# Patient Record
Sex: Female | Born: 1945 | Race: White | Hispanic: No | Marital: Married | Smoking: Never smoker
Health system: Southern US, Community
[De-identification: ages and names within clinical notes are randomized; demographics above are authoritative.]

## PROBLEM LIST (undated history)

## (undated) DIAGNOSIS — I639 Cerebral infarction, unspecified: Secondary | ICD-10-CM

## (undated) DIAGNOSIS — M8580 Other specified disorders of bone density and structure, unspecified site: Secondary | ICD-10-CM

## (undated) DIAGNOSIS — G459 Transient cerebral ischemic attack, unspecified: Secondary | ICD-10-CM

## (undated) DIAGNOSIS — K209 Esophagitis, unspecified without bleeding: Secondary | ICD-10-CM

## (undated) DIAGNOSIS — H269 Unspecified cataract: Secondary | ICD-10-CM

## (undated) DIAGNOSIS — J309 Allergic rhinitis, unspecified: Secondary | ICD-10-CM

## (undated) DIAGNOSIS — E7800 Pure hypercholesterolemia, unspecified: Secondary | ICD-10-CM

## (undated) DIAGNOSIS — M1990 Unspecified osteoarthritis, unspecified site: Secondary | ICD-10-CM

## (undated) DIAGNOSIS — R7989 Other specified abnormal findings of blood chemistry: Secondary | ICD-10-CM

## (undated) DIAGNOSIS — K219 Gastro-esophageal reflux disease without esophagitis: Secondary | ICD-10-CM

## (undated) DIAGNOSIS — Z8619 Personal history of other infectious and parasitic diseases: Secondary | ICD-10-CM

## (undated) HISTORY — DX: Other specified abnormal findings of blood chemistry: R79.89

## (undated) HISTORY — DX: Cerebral infarction, unspecified: I63.9

## (undated) HISTORY — DX: Unspecified cataract: H26.9

## (undated) HISTORY — PX: CATARACT EXTRACTION: SUR2

## (undated) HISTORY — DX: Unspecified osteoarthritis, unspecified site: M19.90

## (undated) HISTORY — DX: Transient cerebral ischemic attack, unspecified: G45.9

## (undated) HISTORY — DX: Personal history of other infectious and parasitic diseases: Z86.19

## (undated) HISTORY — DX: Esophagitis, unspecified: K20.9

## (undated) HISTORY — DX: Other specified disorders of bone density and structure, unspecified site: M85.80

## (undated) HISTORY — PX: HEMORRHOID SURGERY: SHX153

## (undated) HISTORY — DX: Esophagitis, unspecified without bleeding: K20.90

## (undated) HISTORY — DX: Allergic rhinitis, unspecified: J30.9

## (undated) HISTORY — DX: Pure hypercholesterolemia, unspecified: E78.00

## (undated) HISTORY — DX: Gastro-esophageal reflux disease without esophagitis: K21.9

## (undated) HISTORY — PX: INGUINAL HERNIA REPAIR: SUR1180

---

## 1968-10-23 HISTORY — PX: BREAST EXCISIONAL BIOPSY: SUR124

## 1998-10-23 HISTORY — PX: BREAST BIOPSY: SHX20

## 1999-10-24 DIAGNOSIS — G459 Transient cerebral ischemic attack, unspecified: Secondary | ICD-10-CM

## 1999-10-24 HISTORY — DX: Transient cerebral ischemic attack, unspecified: G45.9

## 2003-10-24 HISTORY — PX: COLONOSCOPY: SHX174

## 2004-09-20 ENCOUNTER — Ambulatory Visit: Payer: Self-pay | Admitting: Internal Medicine

## 2005-02-16 ENCOUNTER — Ambulatory Visit: Payer: Self-pay | Admitting: Internal Medicine

## 2005-09-28 ENCOUNTER — Ambulatory Visit: Payer: Self-pay | Admitting: Internal Medicine

## 2006-12-04 ENCOUNTER — Ambulatory Visit: Payer: Self-pay | Admitting: Internal Medicine

## 2006-12-13 ENCOUNTER — Ambulatory Visit: Payer: Self-pay | Admitting: Internal Medicine

## 2006-12-18 ENCOUNTER — Ambulatory Visit: Payer: Self-pay | Admitting: Internal Medicine

## 2007-01-08 ENCOUNTER — Ambulatory Visit: Payer: Self-pay | Admitting: Oncology

## 2007-01-22 ENCOUNTER — Ambulatory Visit: Payer: Self-pay | Admitting: Oncology

## 2007-04-09 ENCOUNTER — Ambulatory Visit: Payer: Self-pay | Admitting: Oncology

## 2007-04-23 ENCOUNTER — Ambulatory Visit: Payer: Self-pay | Admitting: Oncology

## 2007-07-24 ENCOUNTER — Ambulatory Visit: Payer: Self-pay | Admitting: Oncology

## 2007-11-11 IMAGING — CT CT ABDOMEN WO/W CM
1 of 4 series · 11 of 32 positions shown, 17 images · non-contrast
Comparison: none

REASON FOR EXAM: f/u on US  fullness on Pancreatic head  bilateral
solidness renal masses
COMMENTS:

[Series 3: with · axial · 0.62mm/px · z∈[-564,-188]mm · 11 of 57 slices shown, 17 images]
[im 5/57  soft-tissue]
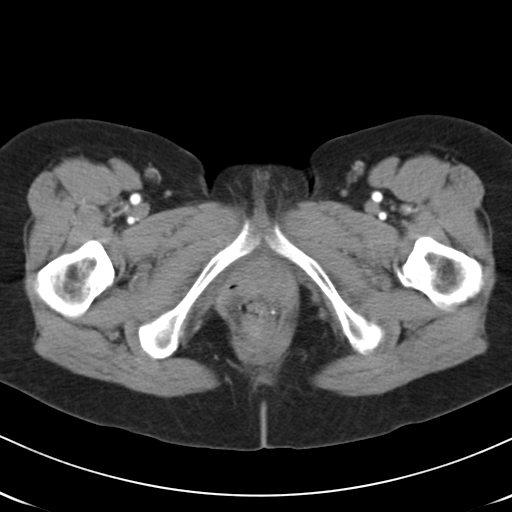
[im 5/57  bone]
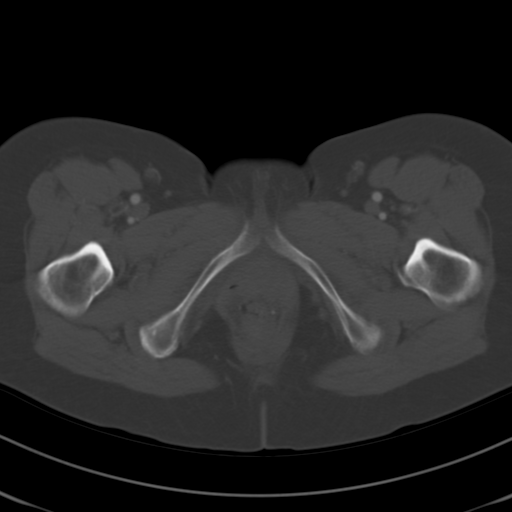
[im 10/57  soft-tissue]
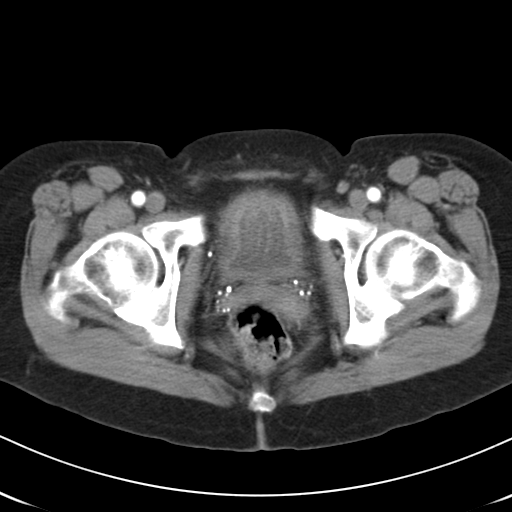
[im 15/57  soft-tissue]
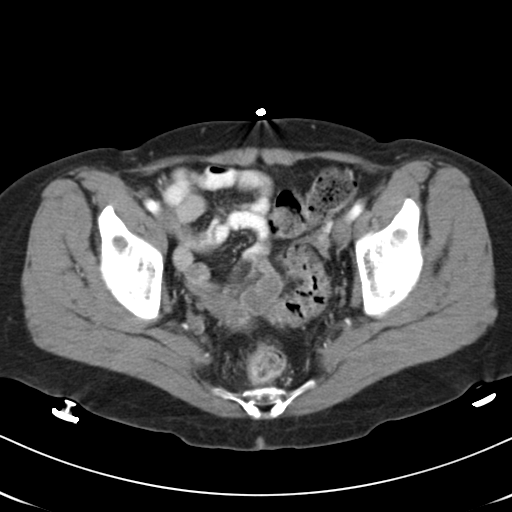
[im 19/57  soft-tissue]
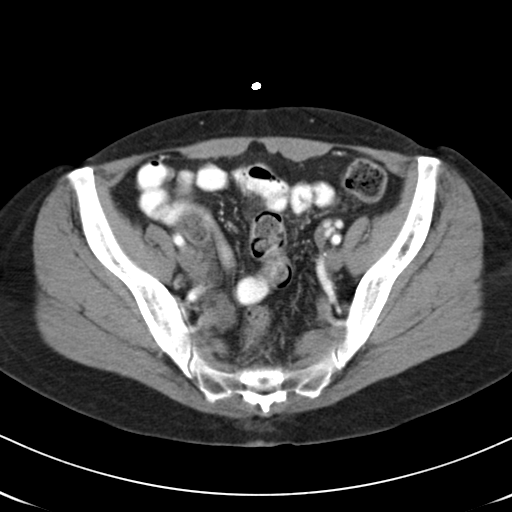
[im 24/57  soft-tissue]
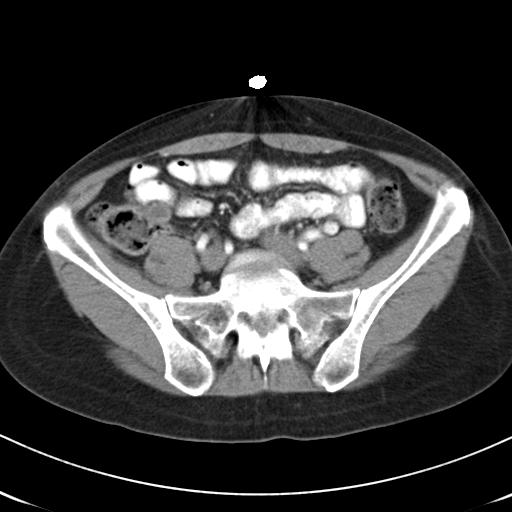
[im 29/57  soft-tissue]
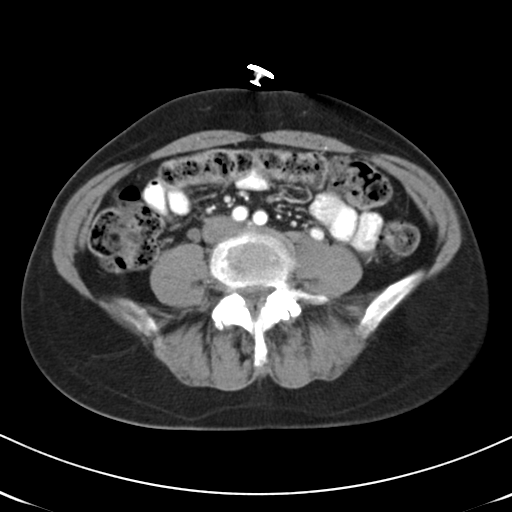
[im 33/57  soft-tissue]
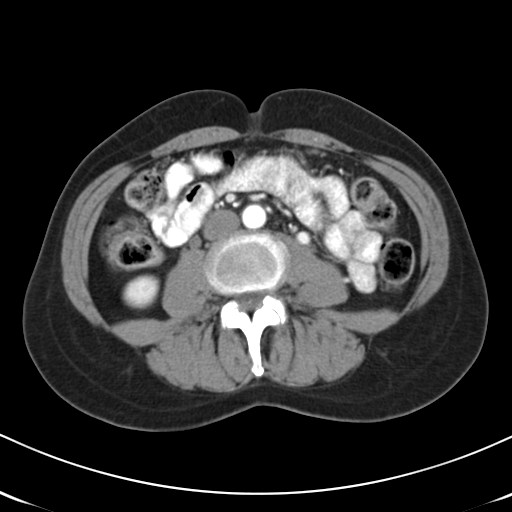
[im 38/57  soft-tissue]
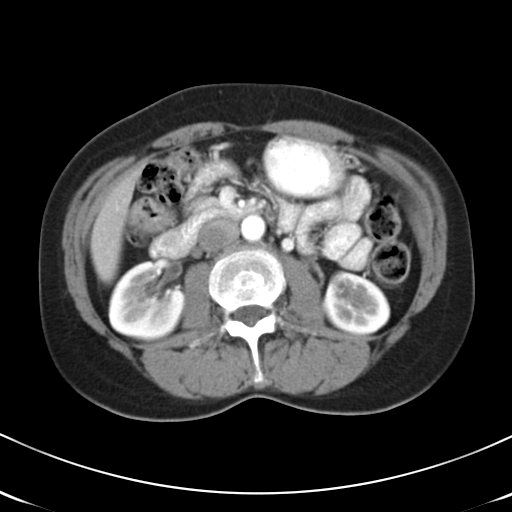
[im 38/57  lung]
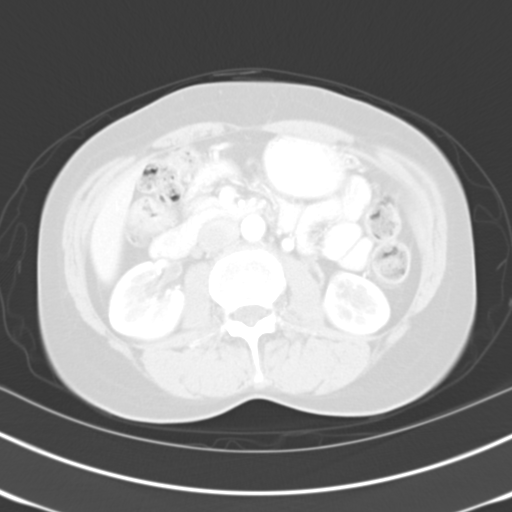
[im 43/57  soft-tissue]
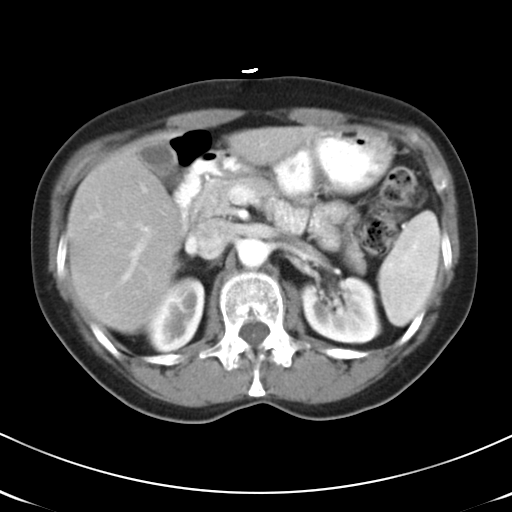
[im 43/57  lung]
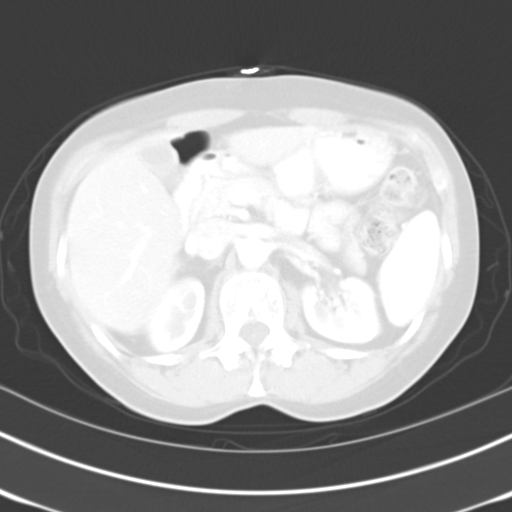
[im 43/57  bone]
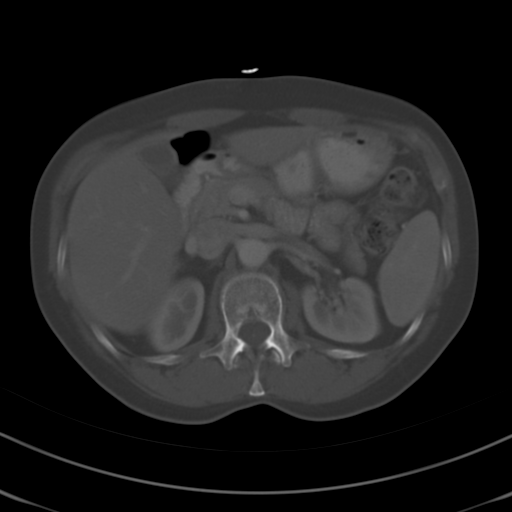
[im 47/57  soft-tissue]
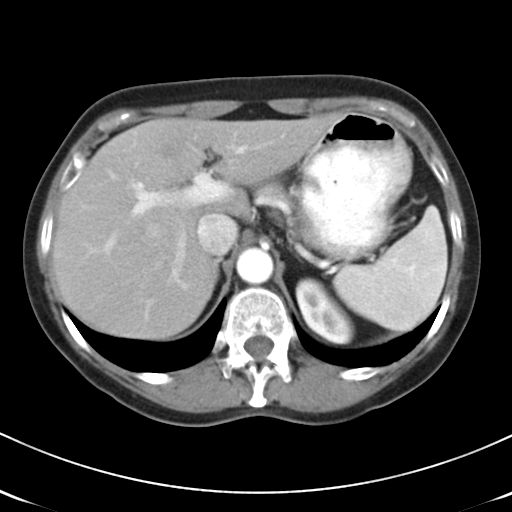
[im 47/57  lung]
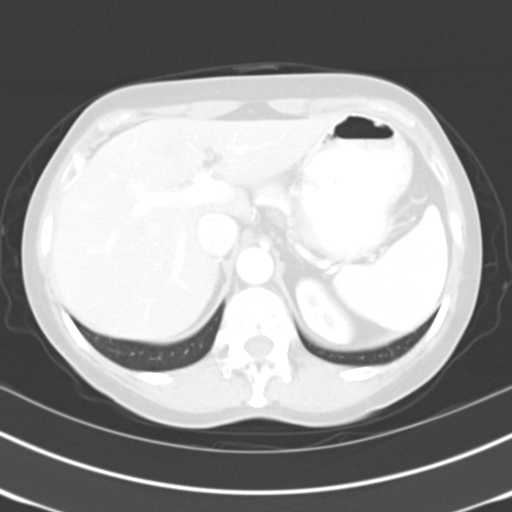
[im 52/57  soft-tissue]
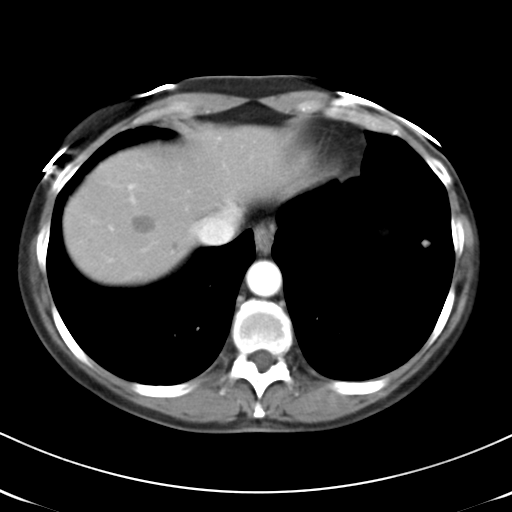
[im 52/57  lung]
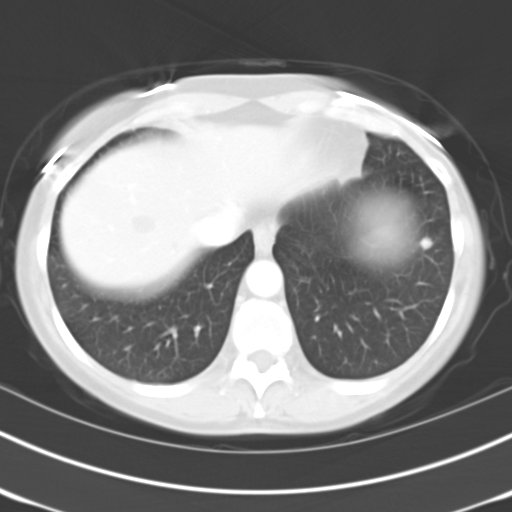

[11 of 32 positions shown; findings below may reference images not displayed]

PROCEDURE:     CT  - CT ABDOMEN STANDARD W/WO  - December 13, 2006  [DATE]

RESULT:     Triphasic CT scan of the abdomen is performed. There are
multiple well circumscribed low attenuation areas within the liver
suggestive of hepatic cysts. The showing no evidence of enhancement, nodule
or wall thickening. The pancreas has been normal. There is no pancreatic
ductal dilatation or hepatic ductal dilatation. The spleen is unremarkable.
The adrenal glands and kidneys are normal. The abdominal aorta is
unremarkable. And kidneys show some tiny areas of low density which too
small fracture characterization on image 16. A larger area of low density
seen along the anterior/inferior aspect of the knee left kidney measuring
approximately 16 mm in size. This has Hounsfield reading of -1 and may
contain some fat. An angiomyolipoma may be present in this region. Followup
in 6 months 2 year would be recommended the lung bases are clear.
IMPRESSION: 1. Simple hepatic cysts.
2. No evidence of pancreatic mass.
3. Probable left renal angiomyolipoma. Followed CT in [AGE] would be
recommended to ensure stability.

## 2008-02-25 ENCOUNTER — Ambulatory Visit: Payer: Self-pay | Admitting: Internal Medicine

## 2009-02-26 ENCOUNTER — Ambulatory Visit: Payer: Self-pay | Admitting: Internal Medicine

## 2010-08-15 ENCOUNTER — Ambulatory Visit: Payer: Self-pay | Admitting: Internal Medicine

## 2010-10-23 DIAGNOSIS — Z8619 Personal history of other infectious and parasitic diseases: Secondary | ICD-10-CM

## 2010-10-23 HISTORY — PX: ESOPHAGOGASTRODUODENOSCOPY: SHX1529

## 2010-10-23 HISTORY — DX: Personal history of other infectious and parasitic diseases: Z86.19

## 2011-05-18 ENCOUNTER — Ambulatory Visit: Payer: Self-pay | Admitting: Internal Medicine

## 2011-07-18 ENCOUNTER — Ambulatory Visit: Payer: Self-pay | Admitting: Gastroenterology

## 2011-07-21 LAB — PATHOLOGY REPORT

## 2011-09-12 ENCOUNTER — Ambulatory Visit: Payer: Self-pay | Admitting: Internal Medicine

## 2012-09-04 ENCOUNTER — Encounter: Payer: Self-pay | Admitting: Internal Medicine

## 2012-09-04 ENCOUNTER — Ambulatory Visit (INDEPENDENT_AMBULATORY_CARE_PROVIDER_SITE_OTHER): Payer: BC Managed Care – PPO | Admitting: Internal Medicine

## 2012-09-04 VITALS — BP 112/72 | HR 67 | Temp 98.4°F | Ht 62.5 in | Wt 128.5 lb

## 2012-09-04 DIAGNOSIS — D3000 Benign neoplasm of unspecified kidney: Secondary | ICD-10-CM

## 2012-09-04 DIAGNOSIS — K219 Gastro-esophageal reflux disease without esophagitis: Secondary | ICD-10-CM

## 2012-09-04 DIAGNOSIS — M899 Disorder of bone, unspecified: Secondary | ICD-10-CM

## 2012-09-04 DIAGNOSIS — Z139 Encounter for screening, unspecified: Secondary | ICD-10-CM

## 2012-09-04 DIAGNOSIS — D179 Benign lipomatous neoplasm, unspecified: Secondary | ICD-10-CM

## 2012-09-04 DIAGNOSIS — M8580 Other specified disorders of bone density and structure, unspecified site: Secondary | ICD-10-CM

## 2012-09-04 DIAGNOSIS — M949 Disorder of cartilage, unspecified: Secondary | ICD-10-CM

## 2012-09-04 DIAGNOSIS — I10 Essential (primary) hypertension: Secondary | ICD-10-CM

## 2012-09-04 DIAGNOSIS — E7800 Pure hypercholesterolemia, unspecified: Secondary | ICD-10-CM

## 2012-09-04 DIAGNOSIS — G459 Transient cerebral ischemic attack, unspecified: Secondary | ICD-10-CM

## 2012-09-04 NOTE — Patient Instructions (Addendum)
It was nice seeing you today.  I am glad you have been doing well.  Let me know if you need anything. 

## 2012-09-06 ENCOUNTER — Ambulatory Visit: Payer: Self-pay | Admitting: Internal Medicine

## 2012-09-08 ENCOUNTER — Encounter: Payer: Self-pay | Admitting: Internal Medicine

## 2012-09-08 DIAGNOSIS — Z8673 Personal history of transient ischemic attack (TIA), and cerebral infarction without residual deficits: Secondary | ICD-10-CM | POA: Insufficient documentation

## 2012-09-08 DIAGNOSIS — M8580 Other specified disorders of bone density and structure, unspecified site: Secondary | ICD-10-CM | POA: Insufficient documentation

## 2012-09-08 DIAGNOSIS — D179 Benign lipomatous neoplasm, unspecified: Secondary | ICD-10-CM | POA: Insufficient documentation

## 2012-09-08 DIAGNOSIS — E7800 Pure hypercholesterolemia, unspecified: Secondary | ICD-10-CM | POA: Insufficient documentation

## 2012-09-08 DIAGNOSIS — K219 Gastro-esophageal reflux disease without esophagitis: Secondary | ICD-10-CM | POA: Insufficient documentation

## 2012-09-08 NOTE — Assessment & Plan Note (Signed)
Last vitamin D level checked 08/09/11 - wnl.  She continues calcium, vitamin D and weight bearing exercises.  Bone density 01/29/08 revealed osteopenia with some slight decrease.  Will notify me when agreeable for a follow up bone density (per previous conversation).

## 2012-09-08 NOTE — Assessment & Plan Note (Signed)
Worked up by Dr Choksi.  Felt no further w/up warranted.    

## 2012-09-08 NOTE — Assessment & Plan Note (Signed)
The feeling in her throat could be reflux.  On dexilant.  EGD 07/18/11 revealed reflux esophagitis.  Treated for H. Pylori.  Wants to see Dr Jenne Campus.  Follow.

## 2012-09-08 NOTE — Assessment & Plan Note (Signed)
On simvastatin.  Low cholesterol diet and exercise.  Check lipid panel and liver function.  

## 2012-09-08 NOTE — Assessment & Plan Note (Signed)
Maintained on Plavix and doing well.  Follow.    

## 2012-09-08 NOTE — Progress Notes (Signed)
  Subjective:    Patient ID: Joan Ritter, female    DOB: 09-28-1946, 66 y.o.   MRN: [id number]  HPI 66 year old female with past history of hypercholesterolemia and TIA who comes in today for a scheduled follow up.  States she is doing well.  Feels good.  Staying active.  Saw Dr Cheree Ditto recently.  Had a precancerous skin lesion removed from her abdomen.  Clear margins.  Recommended follow up in one year.  She is having some drainage in her throat.  Is planning to see Dr Jenne Campus for this.  No dysphagia or odynophagia.  We discussed the possibility of acid reflux.  Taking dexilant.    Past Medical History  Diagnosis Date  . TIA (transient ischemic attack) 2001  . Hypercholesterolemia   . GERD (gastroesophageal reflux disease)   . Allergic rhinitis     seasonal  . Positive H. pylori test     history of    Review of Systems Patient denies any headache, lightheadedness or dizziness.  Drainage as outlined.  Feels like something in her throat.  No chest pain, tightness or palpitations.  No increased shortness of breath, cough or congestion.  No nausea or vomiting.  No abdominal pain or cramping.  No bowel change, such as diarrhea, constipation, BRBPR or melana.  No urine change.        Objective:   Physical Exam Filed Vitals:   09/04/12 1513  BP: 112/72  Pulse: 67  Temp: 98.4 F (49.65 C)   66 year old female in no acute distress.   HEENT:  Nares - clear.  OP- without lesions or erythema.  NECK:  Supple, nontender.   HEART:  Appears to be regular. LUNGS:  Without crackles or wheezing audible.  Respirations even and unlabored.   RADIAL PULSE:  Equal bilaterally.  ABDOMEN:  Soft, nontender.  No audible abdominal bruit.   EXTREMITIES:  No increased edema to be present.                     Assessment & Plan:  CONGESTION.  Discussed flushing with saline nasal spray.  Use Flonase.  She is going to see Dr Jenne Campus for evaluation.  Continue Dexilant for reflux.  Follow.    DERMATOLOGY.   Recent skin cancer removed.  (precancerous lesion).  Continue to follow up with Dr Cheree Ditto.    LEFT CAROTID BRUIT.  Carotid ultrasound 05/18/11 revealed a small amount of plaque, but no hemodynamically significant stenosis.  Continue Plavix.  Currently asymptomatic.    HEALTH MAINTENANCE.  Physical 08/16/11.  Wanted to wait until next visit for her physical.  Mammogram 09/12/11 - BiRADS II.  Schedule a follow up mammogram.  Colonoscopy 03/26/04 normal.

## 2012-09-10 ENCOUNTER — Other Ambulatory Visit (INDEPENDENT_AMBULATORY_CARE_PROVIDER_SITE_OTHER): Payer: BC Managed Care – PPO

## 2012-09-10 DIAGNOSIS — E7800 Pure hypercholesterolemia, unspecified: Secondary | ICD-10-CM

## 2012-09-10 DIAGNOSIS — I10 Essential (primary) hypertension: Secondary | ICD-10-CM

## 2012-09-10 LAB — LIPID PANEL
LDL Cholesterol: 108 mg/dL — ABNORMAL HIGH (ref 0–99)
VLDL: 16.8 mg/dL (ref 0.0–40.0)

## 2012-09-10 LAB — HEPATIC FUNCTION PANEL
Bilirubin, Direct: 0.1 mg/dL (ref 0.0–0.3)
Total Bilirubin: 0.7 mg/dL (ref 0.3–1.2)

## 2012-09-10 LAB — BASIC METABOLIC PANEL
BUN: 16 mg/dL (ref 6–23)
GFR: 68.17 mL/min (ref >60.00)
Glucose, Bld: 104 mg/dL — ABNORMAL HIGH (ref 70–99)
Potassium: 4.3 mEq/L (ref 3.5–5.1)

## 2012-09-11 ENCOUNTER — Other Ambulatory Visit: Payer: Self-pay | Admitting: *Deleted

## 2012-09-26 ENCOUNTER — Telehealth: Payer: Self-pay | Admitting: Internal Medicine

## 2012-09-26 NOTE — Telephone Encounter (Signed)
Refill request for simvastatin 10 mg tablet  Qty: 90 Sig: take 1 tablet by mouth once a day

## 2012-09-27 ENCOUNTER — Telehealth: Payer: Self-pay | Admitting: Internal Medicine

## 2012-09-27 MED ORDER — SIMVASTATIN 10 MG PO TABS: 10.0000 mg | ORAL_TABLET | Freq: Every day | ORAL | Status: DC

## 2012-09-27 NOTE — Telephone Encounter (Signed)
Refill on Simvastin 10 mg and Plavix 75 mg

## 2012-09-27 NOTE — Telephone Encounter (Signed)
Sent in to pharmacy.  

## 2012-09-27 NOTE — Telephone Encounter (Signed)
error 

## 2012-09-27 NOTE — Telephone Encounter (Signed)
Pt calling again asking for refill. She only has 1 pill left.

## 2012-09-30 ENCOUNTER — Telehealth: Payer: Self-pay | Admitting: Internal Medicine

## 2012-09-30 NOTE — Telephone Encounter (Signed)
Ok to refill x 6.  Need to call in today if out

## 2012-09-30 NOTE — Telephone Encounter (Signed)
Pt is needing refill on Plavix (generic). Her pharmacy is saying they never received it and she is completely out.

## 2012-10-01 MED ORDER — CLOPIDOGREL BISULFATE 75 MG PO TABS: 75.0000 mg | ORAL_TABLET | Freq: Every day | ORAL | Status: DC

## 2012-10-01 NOTE — Telephone Encounter (Signed)
Pharmacy called they have not received the order for her generic Plavix. The patient is calling.

## 2012-10-01 NOTE — Telephone Encounter (Signed)
Called prescription in to pharmacy. Called patient to let her know.

## 2012-10-09 ENCOUNTER — Ambulatory Visit: Payer: Self-pay | Admitting: Internal Medicine

## 2012-10-28 ENCOUNTER — Encounter: Payer: Self-pay | Admitting: Internal Medicine

## 2012-11-01 ENCOUNTER — Other Ambulatory Visit: Payer: Self-pay | Admitting: Internal Medicine

## 2012-11-01 NOTE — Telephone Encounter (Signed)
Sent in to pharmacy.  

## 2013-01-30 NOTE — Telephone Encounter (Signed)
Please close encounter

## 2013-02-25 ENCOUNTER — Other Ambulatory Visit: Payer: BC Managed Care – PPO

## 2013-02-25 ENCOUNTER — Telehealth: Payer: Self-pay | Admitting: *Deleted

## 2013-02-25 DIAGNOSIS — G459 Transient cerebral ischemic attack, unspecified: Secondary | ICD-10-CM

## 2013-02-25 DIAGNOSIS — M8580 Other specified disorders of bone density and structure, unspecified site: Secondary | ICD-10-CM

## 2013-02-25 DIAGNOSIS — E7800 Pure hypercholesterolemia, unspecified: Secondary | ICD-10-CM

## 2013-02-25 NOTE — Telephone Encounter (Signed)
Pt is coming in for labs tomorrow 05.07.2014 what labs and dx would you like?  Thank you 

## 2013-02-25 NOTE — Telephone Encounter (Signed)
Order placed for labs.

## 2013-02-26 ENCOUNTER — Other Ambulatory Visit (INDEPENDENT_AMBULATORY_CARE_PROVIDER_SITE_OTHER): Payer: Medicare Other

## 2013-02-26 DIAGNOSIS — E7800 Pure hypercholesterolemia, unspecified: Secondary | ICD-10-CM

## 2013-02-26 DIAGNOSIS — G459 Transient cerebral ischemic attack, unspecified: Secondary | ICD-10-CM

## 2013-02-26 LAB — CBC WITH DIFFERENTIAL/PLATELET
Basophils Absolute: 0 10*3/uL (ref 0.0–0.1)
Eosinophils Absolute: 0.1 10*3/uL (ref 0.0–0.7)
Hemoglobin: 14.1 g/dL (ref 12.0–15.0)
Lymphocytes Relative: 30.4 % (ref 12.0–46.0)
MCHC: 34.2 g/dL (ref 30.0–36.0)
Monocytes Relative: 8.8 % (ref 3.0–12.0)
Neutro Abs: 3.1 10*3/uL (ref 1.4–7.7)
Platelets: 179 10*3/uL (ref 150.0–400.0)
RDW: 13 % (ref 11.5–14.6)

## 2013-02-26 LAB — HEPATIC FUNCTION PANEL
ALT: 21 U/L (ref 0–35)
AST: 23 U/L (ref 0–37)
Albumin: 3.7 g/dL (ref 3.5–5.2)
Alkaline Phosphatase: 53 U/L (ref 39–117)
Bilirubin, Direct: 0.1 mg/dL (ref 0.0–0.3)
Total Protein: 6.3 g/dL (ref 6.0–8.3)

## 2013-02-26 LAB — BASIC METABOLIC PANEL
BUN: 14 mg/dL (ref 6–23)
Calcium: 8.9 mg/dL (ref 8.4–10.5)
Chloride: 107 mEq/L (ref 96–112)
Creatinine, Ser: 0.9 mg/dL (ref 0.4–1.2)
GFR: 68.97 mL/min (ref >60.00)

## 2013-02-26 LAB — LIPID PANEL
Cholesterol: 169 mg/dL (ref 0–200)
LDL Cholesterol: 95 mg/dL (ref 0–99)
Triglycerides: 62 mg/dL (ref 0.0–149.0)

## 2013-03-04 ENCOUNTER — Other Ambulatory Visit (HOSPITAL_COMMUNITY)
Admission: RE | Admit: 2013-03-04 | Discharge: 2013-03-04 | Disposition: A | Discharge status: Home / Self Care | Payer: Medicare Other | Source: Ambulatory Visit | Attending: Internal Medicine | Admitting: Internal Medicine

## 2013-03-04 ENCOUNTER — Encounter: Payer: Self-pay | Admitting: Internal Medicine

## 2013-03-04 ENCOUNTER — Ambulatory Visit (INDEPENDENT_AMBULATORY_CARE_PROVIDER_SITE_OTHER): Payer: Medicare Other | Admitting: Internal Medicine

## 2013-03-04 VITALS — BP 110/70 | HR 65 | Temp 98.0°F | Ht 62.25 in | Wt 129.5 lb

## 2013-03-04 DIAGNOSIS — Z1151 Encounter for screening for human papillomavirus (HPV): Secondary | ICD-10-CM | POA: Insufficient documentation

## 2013-03-04 DIAGNOSIS — Z1211 Encounter for screening for malignant neoplasm of colon: Secondary | ICD-10-CM

## 2013-03-04 DIAGNOSIS — Z124 Encounter for screening for malignant neoplasm of cervix: Secondary | ICD-10-CM

## 2013-03-04 DIAGNOSIS — D3000 Benign neoplasm of unspecified kidney: Secondary | ICD-10-CM

## 2013-03-04 DIAGNOSIS — Z01419 Encounter for gynecological examination (general) (routine) without abnormal findings: Secondary | ICD-10-CM | POA: Insufficient documentation

## 2013-03-04 DIAGNOSIS — G459 Transient cerebral ischemic attack, unspecified: Secondary | ICD-10-CM

## 2013-03-04 DIAGNOSIS — E7800 Pure hypercholesterolemia, unspecified: Secondary | ICD-10-CM

## 2013-03-04 DIAGNOSIS — K219 Gastro-esophageal reflux disease without esophagitis: Secondary | ICD-10-CM

## 2013-03-04 DIAGNOSIS — D179 Benign lipomatous neoplasm, unspecified: Secondary | ICD-10-CM

## 2013-03-04 DIAGNOSIS — M8580 Other specified disorders of bone density and structure, unspecified site: Secondary | ICD-10-CM

## 2013-03-04 DIAGNOSIS — M949 Disorder of cartilage, unspecified: Secondary | ICD-10-CM

## 2013-03-04 NOTE — Progress Notes (Signed)
Subjective:    Patient ID: Joan Ritter, female    DOB: 09/05/1946, 67 y.o.   MRN: [id number]  HPI 67 year old female with past history of hypercholesterolemia and TIA who comes in today to follow up on these issues as well as for a complete physical exam.  States she is doing well.  Feels good.  Staying active.  Saw Dr Cheree Ditto.  Had a precancerous skin lesion removed from her abdomen.  Clear margins.  Recommended follow up in one year.  Saw Dr Jenne Campus.  He stopped her Dexilant and placed her on omeprazole and ranitidine.  Still with some increased sensation in her throat.  No dysphagia or odynophagia.  No nausea or vomiting.  Bowels stable.    Past Medical History  Diagnosis Date  . TIA (transient ischemic attack) 2001  . Hypercholesterolemia   . GERD (gastroesophageal reflux disease)   . Allergic rhinitis     seasonal  . Positive H. pylori test     history of    Current Outpatient Prescriptions on File Prior to Visit  Medication Sig Dispense Refill  . aspirin 81 MG tablet Take 81 mg by mouth daily.      . Calcium Carbonate-Vit D-Min 600-400 MG-UNIT TABS Take 1 tablet by mouth 2 (two) times daily.      . cetirizine (ZYRTEC) 10 MG tablet Take 10 mg by mouth daily.      . clopidogrel (PLAVIX) 75 MG tablet Take 1 tablet (75 mg total) by mouth daily.  30 tablet  6  . fluticasone (FLONASE) 50 MCG/ACT nasal spray USE 2 SPRAYS EACH NOSTRIL EVERY DAY  16 g  5  . glucosamine-chondroitin 500-400 MG tablet Take 1 tablet by mouth daily.      . Multiple Vitamin (MULTIVITAMIN) tablet Take 1 tablet by mouth daily.      . simvastatin (ZOCOR) 10 MG tablet Take 1 tablet (10 mg total) by mouth at bedtime.  90 tablet  1  . acyclovir (ZOVIRAX) 800 MG tablet Take 800 mg by mouth as needed.      Marland Kitchen dexlansoprazole (DEXILANT) 60 MG capsule Take 60 mg by mouth daily.       No current facility-administered medications on file prior to visit.    Review of Systems Patient denies any headache, lightheadedness  or dizziness.  Feels like something in her throat.  On omeprazole and ranitidine.  Seeing Dr Jenne Campus.  No chest pain, tightness or palpitations.  No increased shortness of breath, cough or congestion.  No nausea or vomiting.  No abdominal pain or cramping.  No bowel change, such as diarrhea, constipation, BRBPR or melana.  No urine change.        Objective:   Physical Exam  Filed Vitals:   03/04/13 0824  BP: 110/70  Pulse: 65  Temp: 98 F (67.34 C)   67 year old female in no acute distress.   HEENT:  Nares- clear.  Oropharynx - without lesions. NECK:  Supple.  Nontender.  No audible bruit.  HEART:  Appears to be regular. LUNGS:  No crackles or wheezing audible.  Respirations even and unlabored.  RADIAL PULSE:  Equal bilaterally.    BREASTS:  No nipple discharge or nipple retraction present.  Could not appreciate any distinct nodules or axillary adenopathy.  ABDOMEN:  Soft, nontender.  Bowel sounds present and normal.  No audible abdominal bruit.  GU:  Normal external genitalia.  Vaginal vault without lesions.  Cervix identified.  Pap performed. Could not appreciate  any adnexal masses or tenderness.   RECTAL:  Heme negative.   EXTREMITIES:  No increased edema present.  DP pulses palpable and equal bilaterally.            Assessment & Plan:  THROAT FULLNESS.   Continue with saline nasal spray.  Use Flonase.  Seeing Dr Jenne Campus.  On omeprazole and ranitidine.    DERMATOLOGY.  Recent skin cancer removed.  (precancerous lesion).  Continue to follow up with Dr Cheree Ditto.    LEFT CAROTID BRUIT.  Carotid ultrasound 05/18/11 revealed a small amount of plaque, but no hemodynamically significant stenosis.  Continue Plavix.  Currently asymptomatic.  Recheck carotid ultrasound.    HEALTH MAINTENANCE.  Physical toay.   Mammogram 10/09/12  - BiRADS II.  Colonoscopy 03/26/04 normal.  Recommend f/u in 10 years (per pt)

## 2013-03-06 ENCOUNTER — Other Ambulatory Visit (INDEPENDENT_AMBULATORY_CARE_PROVIDER_SITE_OTHER): Payer: Medicare Other

## 2013-03-06 ENCOUNTER — Encounter: Payer: Self-pay | Admitting: Internal Medicine

## 2013-03-06 DIAGNOSIS — Z1211 Encounter for screening for malignant neoplasm of colon: Secondary | ICD-10-CM

## 2013-03-06 NOTE — Assessment & Plan Note (Signed)
Worked up by Dr Choksi.  Felt no further w/up warranted.    

## 2013-03-06 NOTE — Assessment & Plan Note (Signed)
Continue calcium, vitamin D and weight bearing exercises.  Bone density 01/29/08 revealed osteopenia with some slight decrease.  Will notify me when agreeable for a follow up bone density (per previous conversation).

## 2013-03-06 NOTE — Assessment & Plan Note (Signed)
Maintained on Plavix and doing well.  Follow.

## 2013-03-06 NOTE — Assessment & Plan Note (Signed)
EGD 07/18/11 revealed reflux esophagitis.  Treated for H. Pylori.  Seeing Dr Jenne Campus.  Follow.

## 2013-03-06 NOTE — Assessment & Plan Note (Signed)
On simvastatin.  Low cholesterol diet and exercise.  Follow lipid panel and liver function.      

## 2013-03-07 ENCOUNTER — Encounter: Payer: Self-pay | Admitting: *Deleted

## 2013-03-10 ENCOUNTER — Encounter: Payer: Self-pay | Admitting: *Deleted

## 2013-03-12 ENCOUNTER — Encounter: Payer: Self-pay | Admitting: *Deleted

## 2013-03-26 ENCOUNTER — Encounter: Payer: Self-pay | Admitting: Internal Medicine

## 2013-03-28 ENCOUNTER — Other Ambulatory Visit: Payer: Self-pay | Admitting: Internal Medicine

## 2013-04-23 ENCOUNTER — Other Ambulatory Visit: Payer: Self-pay | Admitting: *Deleted

## 2013-04-23 MED ORDER — CLOPIDOGREL BISULFATE 75 MG PO TABS: 75.0000 mg | ORAL_TABLET | Freq: Every day | ORAL | Status: DC

## 2013-05-12 ENCOUNTER — Other Ambulatory Visit: Payer: Self-pay | Admitting: Gastroenterology

## 2013-08-27 ENCOUNTER — Telehealth: Payer: Self-pay | Admitting: *Deleted

## 2013-08-27 NOTE — Telephone Encounter (Signed)
See if she can come in next Wednesday 09/03/13 at 12:00 and block the 8:00 appt on 09/05/13.  Thanks.

## 2013-08-27 NOTE — Telephone Encounter (Signed)
Pt is scheduled to have cataract surgery on 09/23/13. She has a follow-up appt with you on 11/14 @ 8am ( ). She was hoping to have her pre-op done then as well. I informed patient that we may need to allow more time & I would call her if we need to make any changes to her appt (okay to leave on her voicemail-because she will be out of town) **Form at [employer]**

## 2013-08-28 NOTE — Telephone Encounter (Signed)
Left messages on voicemail to inform patient that her appt has been moved to 11/12 @ 12 as she requested

## 2013-09-02 ENCOUNTER — Other Ambulatory Visit (INDEPENDENT_AMBULATORY_CARE_PROVIDER_SITE_OTHER): Payer: Medicare Other

## 2013-09-02 DIAGNOSIS — M8580 Other specified disorders of bone density and structure, unspecified site: Secondary | ICD-10-CM

## 2013-09-02 DIAGNOSIS — E7800 Pure hypercholesterolemia, unspecified: Secondary | ICD-10-CM

## 2013-09-02 LAB — LIPID PANEL
Cholesterol: 179 mg/dL (ref 0–200)
HDL: 59.5 mg/dL (ref >39.00)
Total CHOL/HDL Ratio: 3
Triglycerides: 109 mg/dL (ref 0.0–149.0)

## 2013-09-02 LAB — HEPATIC FUNCTION PANEL
ALT: 20 U/L (ref 0–35)
Albumin: 3.6 g/dL (ref 3.5–5.2)
Total Protein: 6.3 g/dL (ref 6.0–8.3)

## 2013-09-03 ENCOUNTER — Ambulatory Visit (INDEPENDENT_AMBULATORY_CARE_PROVIDER_SITE_OTHER): Payer: Medicare Other | Admitting: Internal Medicine

## 2013-09-03 ENCOUNTER — Encounter (INDEPENDENT_AMBULATORY_CARE_PROVIDER_SITE_OTHER): Payer: Self-pay

## 2013-09-03 ENCOUNTER — Encounter: Payer: Self-pay | Admitting: Internal Medicine

## 2013-09-03 VITALS — BP 100/70 | HR 64 | Temp 98.4°F | Ht 62.25 in | Wt 129.8 lb

## 2013-09-03 DIAGNOSIS — G459 Transient cerebral ischemic attack, unspecified: Secondary | ICD-10-CM

## 2013-09-03 DIAGNOSIS — Z01818 Encounter for other preprocedural examination: Secondary | ICD-10-CM

## 2013-09-03 DIAGNOSIS — K219 Gastro-esophageal reflux disease without esophagitis: Secondary | ICD-10-CM

## 2013-09-03 NOTE — Progress Notes (Signed)
Pre-visit discussion using our clinic review tool. No additional management support is needed unless otherwise documented below in the visit note.  

## 2013-09-05 ENCOUNTER — Ambulatory Visit: Payer: Medicare Other | Admitting: Internal Medicine

## 2013-09-07 ENCOUNTER — Encounter: Payer: Self-pay | Admitting: Internal Medicine

## 2013-09-07 DIAGNOSIS — Z01818 Encounter for other preprocedural examination: Secondary | ICD-10-CM | POA: Insufficient documentation

## 2013-09-07 NOTE — Assessment & Plan Note (Signed)
Maintained on Plavix and doing well.  Per opthalmology note, does not need to stop her blood thinner.

## 2013-09-07 NOTE — Assessment & Plan Note (Signed)
EGD 07/18/11 revealed reflux esophagitis.  Treated for H. Pylori.  Seeing Dr Jenne Campus.  Follow.

## 2013-09-07 NOTE — Assessment & Plan Note (Signed)
She is currently very active with no cardiac symptoms with increased activity or exertion.  No sob.  Overall feels good.  I feel from a cardiac standpoint that she is at low risk to proceed with the planned cataract surgery.  I do recommend close intra op and post op monitoring of her heart rate and blood pressure to avoid extremes.

## 2013-09-07 NOTE — Progress Notes (Signed)
Subjective:    Patient ID: Joan Ritter, female    DOB: 05/01/1946, 67 y.o.   MRN: [id number]  HPI 67 year old female with past history of hypercholesterolemia and TIA who comes in today for a pre op evaluation.  States she is doing well.  Feels good.  Staying active.  No chest pain or tightness with increased activity or exertion.  No sob.  No problems with acid reflux.  No dysphagia or odynophagia.  No nausea or vomiting.  Bowels stable.  Overall she feels she is doing well.  Planning for cataract surgery 09/23/13.     Past Medical History  Diagnosis Date  . TIA (transient ischemic attack) 2001  . Hypercholesterolemia   . GERD (gastroesophageal reflux disease)   . Allergic rhinitis     seasonal  . Positive H. pylori test     history of    Current Outpatient Prescriptions on File Prior to Visit  Medication Sig Dispense Refill  . acyclovir (ZOVIRAX) 800 MG tablet Take 800 mg by mouth as needed.      Marland Kitchen aspirin 81 MG tablet Take 81 mg by mouth daily.      . Calcium Carbonate-Vit D-Min 600-400 MG-UNIT TABS Take 1 tablet by mouth 2 (two) times daily.      . cetirizine (ZYRTEC) 10 MG tablet Take 10 mg by mouth daily.      . clopidogrel (PLAVIX) 75 MG tablet Take 1 tablet (75 mg total) by mouth daily.  30 tablet  5  . fluticasone (FLONASE) 50 MCG/ACT nasal spray USE 2 SPRAYS EACH NOSTRIL EVERY DAY  16 g  5  . Multiple Vitamin (MULTIVITAMIN) tablet Take 1 tablet by mouth daily.      Marland Kitchen omeprazole (PRILOSEC) 40 MG capsule Take 40 mg by mouth every morning.      . ranitidine (ZANTAC) 150 MG tablet Take 150 mg by mouth at bedtime.      . simvastatin (ZOCOR) 10 MG tablet TAKE 1 TABLET (10 MG TOTAL) BY MOUTH AT BEDTIME.  90 tablet  1   No current facility-administered medications on file prior to visit.    Review of Systems Patient denies any headache, lightheadedness or dizziness.   No chest pain, tightness or palpitations.  No increased shortness of breath, cough or congestion.  No nausea or  vomiting.  No abdominal pain or cramping.  No bowel change, such as diarrhea, constipation, BRBPR or melana.  No urine change.  Overall feels good.        Objective:   Physical Exam  Filed Vitals:   09/03/13 1217  BP: 100/70  Pulse: 64  Temp: 98.4 F (31.28 C)   67 year old female in no acute distress.   HEENT:  Nares- clear.  Oropharynx - without lesions. NECK:  Supple.  Nontender.  No audible bruit.  HEART:  Appears to be regular. LUNGS:  No crackles or wheezing audible.  Respirations even and unlabored.  RADIAL PULSE:  Equal bilaterally.  ABDOMEN:  Soft, nontender.  Bowel sounds present and normal.  No audible abdominal bruit.   EXTREMITIES:  No increased edema present.  DP pulses palpable and equal bilaterally.            Assessment & Plan:  HEALTH MAINTENANCE.  Physical last visit.   Mammogram 10/09/12  - BiRADS II.  Colonoscopy 03/26/04 normal.  Recommend f/u in 10 years (per pt).  I spent 25 minutes with the patient and more than 50% of the time was spent  in consultation regarding the above.

## 2013-09-25 ENCOUNTER — Other Ambulatory Visit: Payer: Self-pay | Admitting: Internal Medicine

## 2013-10-10 ENCOUNTER — Ambulatory Visit: Payer: Self-pay | Admitting: Internal Medicine

## 2013-10-14 ENCOUNTER — Encounter: Payer: Self-pay | Admitting: Internal Medicine

## 2013-10-21 ENCOUNTER — Other Ambulatory Visit: Payer: Self-pay | Admitting: Internal Medicine

## 2013-12-23 ENCOUNTER — Other Ambulatory Visit: Payer: Self-pay | Admitting: *Deleted

## 2013-12-23 MED ORDER — CLOPIDOGREL BISULFATE 75 MG PO TABS: ORAL_TABLET | ORAL | Status: DC

## 2014-03-06 ENCOUNTER — Encounter: Payer: Medicare Other | Admitting: Internal Medicine

## 2014-03-25 ENCOUNTER — Other Ambulatory Visit: Payer: Self-pay | Admitting: Internal Medicine

## 2014-03-25 NOTE — Telephone Encounter (Signed)
Appt 04/16/13

## 2014-03-26 LAB — HM COLONOSCOPY: HM COLON: NORMAL

## 2014-03-30 ENCOUNTER — Encounter: Payer: Medicare Other | Admitting: Internal Medicine

## 2014-04-16 ENCOUNTER — Encounter: Payer: Self-pay | Admitting: Internal Medicine

## 2014-04-16 ENCOUNTER — Ambulatory Visit (INDEPENDENT_AMBULATORY_CARE_PROVIDER_SITE_OTHER): Payer: Medicare Other | Admitting: Internal Medicine

## 2014-04-16 VITALS — BP 110/70 | HR 68 | Temp 98.1°F | Ht 62.5 in | Wt 133.2 lb

## 2014-04-16 DIAGNOSIS — M899 Disorder of bone, unspecified: Secondary | ICD-10-CM

## 2014-04-16 DIAGNOSIS — Z1211 Encounter for screening for malignant neoplasm of colon: Secondary | ICD-10-CM

## 2014-04-16 DIAGNOSIS — G459 Transient cerebral ischemic attack, unspecified: Secondary | ICD-10-CM

## 2014-04-16 DIAGNOSIS — K219 Gastro-esophageal reflux disease without esophagitis: Secondary | ICD-10-CM

## 2014-04-16 DIAGNOSIS — M949 Disorder of cartilage, unspecified: Secondary | ICD-10-CM

## 2014-04-16 DIAGNOSIS — M8580 Other specified disorders of bone density and structure, unspecified site: Secondary | ICD-10-CM

## 2014-04-16 DIAGNOSIS — D3000 Benign neoplasm of unspecified kidney: Secondary | ICD-10-CM

## 2014-04-16 DIAGNOSIS — D179 Benign lipomatous neoplasm, unspecified: Secondary | ICD-10-CM

## 2014-04-16 DIAGNOSIS — E7800 Pure hypercholesterolemia, unspecified: Secondary | ICD-10-CM

## 2014-04-16 NOTE — Patient Instructions (Signed)
Saline nasal spray - flush nose at least 2-3x/day.    Nasacort - 2 sprays each nostril one time per day.  Do this in the evening.

## 2014-04-19 ENCOUNTER — Encounter: Payer: Self-pay | Admitting: Internal Medicine

## 2014-04-19 NOTE — Progress Notes (Signed)
Subjective:    Patient ID: Joan Ritter, female    DOB: 08-Jul-1946, 68 y.o.   MRN: [id number]  HPI 68 year old female with past history of hypercholesterolemia and TIA who comes in today to follow up on these issues as well as for a complete physical exam.  States she is doing well.  Feels good.  Staying active.  No chest pain or tightness with increased activity or exertion.  No sob.  No problems with acid reflux.  No dysphagia or odynophagia.  No nausea or vomiting.  Bowels stable.  Overall she feels she is doing well.  Using nasacort prn.  Helping with drainage.      Past Medical History  Diagnosis Date  . TIA (transient ischemic attack) 2001  . Hypercholesterolemia   . GERD (gastroesophageal reflux disease)   . Allergic rhinitis     seasonal  . Positive H. pylori test     history of    Current Outpatient Prescriptions on File Prior to Visit  Medication Sig Dispense Refill  . acyclovir (ZOVIRAX) 800 MG tablet Take 800 mg by mouth as needed.      Marland Kitchen aspirin 81 MG tablet Take 81 mg by mouth daily.      . Calcium Carbonate-Vit D-Min 600-400 MG-UNIT TABS Take 1 tablet by mouth 2 (two) times daily.      . cetirizine (ZYRTEC) 10 MG tablet Take 10 mg by mouth daily as needed.       . clopidogrel (PLAVIX) 75 MG tablet TAKE 1 TABLET (75 MG TOTAL) BY MOUTH DAILY.  30 tablet  5  . fluticasone (FLONASE) 50 MCG/ACT nasal spray USE 2 SPRAYS EACH NOSTRIL EVERY DAY  16 g  5  . Multiple Vitamin (MULTIVITAMIN) tablet Take 1 tablet by mouth daily.      Marland Kitchen omeprazole (PRILOSEC) 40 MG capsule Take 40 mg by mouth every morning.      . ranitidine (ZANTAC) 150 MG tablet Take 150 mg by mouth at bedtime.      . simvastatin (ZOCOR) 10 MG tablet TAKE 1 TABLET BY MOUTH AT BEDTIME.  90 tablet  1   No current facility-administered medications on file prior to visit.    Review of Systems Patient denies any headache, lightheadedness or dizziness.  Some intermittent allergies - drainage.   No chest pain,  tightness or palpitations.  No increased shortness of breath, cough or congestion.  No nausea or vomiting.  No acid reflux.  No abdominal pain or cramping.  No bowel change, such as diarrhea, constipation, BRBPR or melana.  No urine change.  Overall feels good.        Objective:   Physical Exam  Filed Vitals:   04/16/14 1448  BP: 110/70  Pulse: 68  Temp: 98.1 F (36.7 C)   Blood pressure recheck:  64/25   68year old female in no acute distress.   HEENT:  Nares- clear.  Oropharynx - without lesions. NECK:  Supple.  Nontender.  No audible bruit.  HEART:  Appears to be regular. LUNGS:  No crackles or wheezing audible.  Respirations even and unlabored.  RADIAL PULSE:  Equal bilaterally.    BREASTS:  No nipple discharge or nipple retraction present.  Could not appreciate any distinct nodules or axillary adenopathy.  ABDOMEN:  Soft, nontender.  Bowel sounds present and normal.  No audible abdominal bruit.  GU:  Not performed.   EXTREMITIES:  No increased edema present.  DP pulses palpable and equal bilaterally.  Assessment & Plan:  HEALTH MAINTENANCE.  Physical today.   Mammogram 10/10/13  - BiRADS I.  Colonoscopy 03/26/04 normal.  Recommend f/u in 10 years.  Due f/u.  Will make referral.  Discussed prevnar and shingles vaccine.  Will get prevnar when returns for her labs.  Will check with insurance regarding shingles vaccine.    I spent 25 minutes with the patient and more than 50% of the time was spent in consultation regarding the above.

## 2014-04-19 NOTE — Assessment & Plan Note (Signed)
Maintained on Plavix and doing well.

## 2014-04-19 NOTE — Assessment & Plan Note (Signed)
On simvastatin.  Low cholesterol diet and exercise.  Follow lipid panel and liver function.      

## 2014-04-19 NOTE — Assessment & Plan Note (Signed)
Worked up by Dr Oliva Bustard.  Felt no further w/up warranted.

## 2014-04-19 NOTE — Assessment & Plan Note (Signed)
EGD 07/18/11 revealed reflux esophagitis.  Treated for H. Pylori.  Currently asymptomatic.  Follow.

## 2014-04-19 NOTE — Assessment & Plan Note (Signed)
Continue calcium, vitamin D and weight bearing exercises.  Bone density 01/29/08 revealed osteopenia with some slight decrease.  Will notify me when agreeable for a follow up bone density.  Discussed with her today.

## 2014-05-01 ENCOUNTER — Other Ambulatory Visit (INDEPENDENT_AMBULATORY_CARE_PROVIDER_SITE_OTHER): Payer: Medicare Other

## 2014-05-01 DIAGNOSIS — M8580 Other specified disorders of bone density and structure, unspecified site: Secondary | ICD-10-CM

## 2014-05-01 DIAGNOSIS — E7800 Pure hypercholesterolemia, unspecified: Secondary | ICD-10-CM

## 2014-05-01 DIAGNOSIS — Z23 Encounter for immunization: Secondary | ICD-10-CM

## 2014-05-01 DIAGNOSIS — G459 Transient cerebral ischemic attack, unspecified: Secondary | ICD-10-CM

## 2014-05-01 LAB — COMPREHENSIVE METABOLIC PANEL
ALK PHOS: 54 U/L (ref 39–117)
ALT: 21 U/L (ref 0–35)
AST: 22 U/L (ref 0–37)
Albumin: 3.6 g/dL (ref 3.5–5.2)
BUN: 16 mg/dL (ref 6–23)
CALCIUM: 9.2 mg/dL (ref 8.4–10.5)
CO2: 26 mEq/L (ref 19–32)
CREATININE: 0.9 mg/dL (ref 0.4–1.2)
Chloride: 106 mEq/L (ref 96–112)
GFR: 66.95 mL/min (ref >60.00)
GLUCOSE: 89 mg/dL (ref 70–99)
Potassium: 4.5 mEq/L (ref 3.5–5.1)
Sodium: 140 mEq/L (ref 135–145)
Total Bilirubin: 0.4 mg/dL (ref 0.2–1.2)
Total Protein: 6.4 g/dL (ref 6.0–8.3)

## 2014-05-01 LAB — LIPID PANEL
CHOLESTEROL: 180 mg/dL (ref 0–200)
HDL: 59.8 mg/dL (ref >39.00)
LDL CALC: 99 mg/dL (ref 0–99)
NonHDL: 120.2
TRIGLYCERIDES: 107 mg/dL (ref 0.0–149.0)
Total CHOL/HDL Ratio: 3
VLDL: 21.4 mg/dL (ref 0.0–40.0)

## 2014-05-01 LAB — TSH: TSH: 2.31 u[IU]/mL (ref 0.35–4.50)

## 2014-05-01 LAB — CBC WITH DIFFERENTIAL/PLATELET
Basophils Absolute: 0 10*3/uL (ref 0.0–0.1)
Basophils Relative: 0.6 % (ref 0.0–3.0)
EOS ABS: 0.2 10*3/uL (ref 0.0–0.7)
EOS PCT: 3 % (ref 0.0–5.0)
HEMATOCRIT: 40.7 % (ref 36.0–46.0)
HEMOGLOBIN: 13.7 g/dL (ref 12.0–15.0)
Lymphocytes Relative: 27.6 % (ref 12.0–46.0)
Lymphs Abs: 1.7 10*3/uL (ref 0.7–4.0)
MCHC: 33.6 g/dL (ref 30.0–36.0)
MCV: 93.4 fl (ref 78.0–100.0)
Monocytes Absolute: 0.6 10*3/uL (ref 0.1–1.0)
Monocytes Relative: 9.8 % (ref 3.0–12.0)
NEUTROS ABS: 3.7 10*3/uL (ref 1.4–7.7)
Neutrophils Relative %: 59 % (ref 43.0–77.0)
Platelets: 211 10*3/uL (ref 150.0–400.0)
RBC: 4.35 Mil/uL (ref 3.87–5.11)
RDW: 13 % (ref 11.5–15.5)
WBC: 6.3 10*3/uL (ref 4.0–10.5)

## 2014-05-03 ENCOUNTER — Encounter: Payer: Self-pay | Admitting: Internal Medicine

## 2014-05-21 ENCOUNTER — Encounter: Payer: Self-pay | Admitting: Internal Medicine

## 2014-05-25 ENCOUNTER — Encounter: Payer: Self-pay | Admitting: Internal Medicine

## 2014-05-27 ENCOUNTER — Telehealth: Payer: Self-pay | Admitting: Internal Medicine

## 2014-05-27 ENCOUNTER — Encounter: Payer: Self-pay | Admitting: Internal Medicine

## 2014-05-27 NOTE — Telephone Encounter (Signed)
Pt had sent me a my chart message regarding needing the results of her colonoscopy and EGD.  I called [facility] and they faxed over the results.  I have placed them in your box.  I don't know if she wants to pick these up or if she wants [place] to send them to GI.  Let me know if I need to do anything else.  Thanks.    Dr Nicki Reaper

## 2014-05-28 NOTE — Telephone Encounter (Signed)
Faxed to [facility]. Pt notified per [employer]

## 2014-06-09 ENCOUNTER — Encounter: Payer: Self-pay | Admitting: Gastroenterology

## 2014-07-02 ENCOUNTER — Encounter: Payer: Self-pay | Admitting: *Deleted

## 2014-07-07 ENCOUNTER — Telehealth: Payer: Self-pay | Admitting: *Deleted

## 2014-07-07 ENCOUNTER — Ambulatory Visit (INDEPENDENT_AMBULATORY_CARE_PROVIDER_SITE_OTHER): Payer: Medicare Other | Admitting: Gastroenterology

## 2014-07-07 ENCOUNTER — Encounter: Payer: Self-pay | Admitting: Gastroenterology

## 2014-07-07 VITALS — BP 100/60 | HR 64 | Ht 62.5 in | Wt 132.0 lb

## 2014-07-07 DIAGNOSIS — Z1211 Encounter for screening for malignant neoplasm of colon: Secondary | ICD-10-CM | POA: Insufficient documentation

## 2014-07-07 MED ORDER — MOVIPREP 100 G PO SOLR: 1.0000 | Freq: Once | ORAL | Status: DC

## 2014-07-07 NOTE — Telephone Encounter (Signed)
07/07/2014   RE: Joan Ritter DOB: 1946-09-10 MRN: [id number]   Dear Dr. Nicki Reaper,    We have scheduled the above patient for an Colonoscopy. Our records show that she is on anticoagulation therapy.   Please advise as to how long the patient may come off her therapy of Plavix prior to the procedure, which is scheduled for 08-10-2014.  Please fax back/ or route the completed form to Evette Georges., CMA   Sincerely,    Hope Pigeon

## 2014-07-07 NOTE — Progress Notes (Addendum)
07/07/2014 Joan Ritter [id number] 05-08-1946   HISTORY OF PRESENT ILLNESS:  This is a pleasant 68 year old female who is here today to schedule colonoscopy.  Her last colonoscopy was 03/2004 by Dr. Candace Cruise at which time the study was normal.  She is not having any issues but is on Plavix.  She has been on Plavix since 2001 when she had a TIA.  Recent labs including CBC, TSH, and CMP are WNL's.   Past Medical History  Diagnosis Date  . TIA (transient ischemic attack) 2001  . Hypercholesterolemia   . GERD (gastroesophageal reflux disease)   . Allergic rhinitis     seasonal  . History of Helicobacter pylori infection 2012   Past Surgical History  Procedure Laterality Date  . Breast biopsy  1970s and 2000    x2  . Hemorrhoid surgery  1980s  . Colonoscopy  2005    normal   . Esophagogastroduodenoscopy  2012    normal     reports that she has never smoked. She has never used smokeless tobacco. She reports that she does not drink alcohol or use illicit drugs. family history includes Brain cancer in her brother; Breast cancer in her maternal aunt; CVA in her mother; Cardiomyopathy in her sister; Lymphoma in her father. There is no history of Colon cancer. Allergies  Allergen Reactions  . No Known Drug Allergy       Outpatient Encounter Prescriptions as of 07/07/2014  Medication Sig  . acyclovir (ZOVIRAX) 800 MG tablet Take 800 mg by mouth as needed.  Marland Kitchen aspirin 81 MG tablet Take 81 mg by mouth daily.  . Calcium Carbonate-Vit D-Min 600-400 MG-UNIT TABS Take 1 tablet by mouth 2 (two) times daily.  . cetirizine (ZYRTEC) 10 MG tablet Take 10 mg by mouth daily as needed.   . clopidogrel (PLAVIX) 75 MG tablet TAKE 1 TABLET (75 MG TOTAL) BY MOUTH DAILY.  . fluticasone (FLONASE) 50 MCG/ACT nasal spray USE 2 SPRAYS EACH NOSTRIL EVERY DAY  . Multiple Vitamin (MULTIVITAMIN) tablet Take 1 tablet by mouth daily.  Marland Kitchen omeprazole (PRILOSEC) 40 MG capsule Take 40 mg by mouth every morning.  .  ranitidine (ZANTAC) 150 MG tablet Take 150 mg by mouth at bedtime.  . simvastatin (ZOCOR) 10 MG tablet TAKE 1 TABLET BY MOUTH AT BEDTIME.     REVIEW OF SYSTEMS  : All other systems reviewed and negative except where noted in the History of Present Illness.   PHYSICAL EXAM: BP 100/60  Pulse 64  Ht 5' 2.5" (1.588 m)  Wt 132 lb (59.875 kg)  BMI 23.74 kg/m2 General: Well developed white female in no acute distress Head: Normocephalic and atraumatic Eyes:  Sclerae anicteric, conjunctiva pink. Ears: Normal auditory acuity Lungs: Clear throughout to auscultation Heart: Regular rate and rhythm Abdomen: Soft, non-distended.  Normal bowel sounds.  Non-tender. Rectal:  Deferred.  Will be done at the time of colonoscopy. Musculoskeletal: Symmetrical with no gross deformities  Skin: No lesions on visible extremities Extremities: No edema  Neurological: Alert oriented x 4, grossly non-focal Psychological:  Alert and cooperative. Normal mood and affect  ASSESSMENT AND PLAN: -Screening colonoscopy:  Will schedule to Dr. Hilarie Fredrickson who reviewed her records previously.  The risks, benefits, and alternatives were discussed with the patient and she consents to proceed.  The risks benefits and alternatives to a temporary hold of anti-coagulants/anti-platelets for the procedure were discussed with the patient she consents to proceed. Obtain clearance from Dr Nicki Reaper for  ok to hold Plavix.    Addendum: Reviewed and agree with initial management. Jerene Bears, MD

## 2014-07-07 NOTE — Addendum Note (Signed)
Addended by: Hope Pigeon A on: 07/07/2014 09:40 AM   Modules accepted: Orders

## 2014-07-07 NOTE — Telephone Encounter (Signed)
I have left a message for Joan Ritter to discuss with her regarding stopping the plavix.  Regarding the time off the medication, usually the physician doing the procedure will let me or the patient know the length of time needed off the medication (to be able to perform the procedure).   Just let me know and as soon as I speak to the patient, I will notify you.  Thanks.

## 2014-07-07 NOTE — Patient Instructions (Signed)
You have been scheduled for a colonoscopy with Dr. Hilarie Fredrickson. Please follow written instructions given to you at your visit today.  Please pick up your prep kit at the pharmacy within the next 1-3 days. If you use inhalers (even only as needed), please bring them with you on the day of your procedure. Your physician has requested that you go to www.startemmi.com and enter the access code given to you at your visit today. This web site gives a general overview about your procedure. However, you should still follow specific instructions given to you by our office regarding your preparation for the procedure.  You will be contacted by our office prior to your procedure for directions on holding your Plavix.  If you do not hear from our office 1 week prior to your scheduled procedure, please call [phone] to discuss.

## 2014-07-08 NOTE — Telephone Encounter (Signed)
Normally we hold Plavix five to seven days before procedure.  Can you please let us know if we hold Plavix for five days before the procedure or seven days before the procedure.  I can notify the patient of your decision. Thank you.

## 2014-07-08 NOTE — Telephone Encounter (Signed)
LMOM for patient to call office back. 

## 2014-07-08 NOTE — Telephone Encounter (Signed)
Patient called back. I advised patient that it is okay to hold Plavix five days before procedure. Patient verbalized understanding.

## 2014-07-08 NOTE — Telephone Encounter (Signed)
I spoke to pt regarding risk of stopping plavix, etc.  She understands the risk and is willing to proceed with the colonoscopy.  If Dr Hilarie Fredrickson is comfortable doing the procedure, then five days off would be fine.  If he feels she needs to be off longer to have the procedure, then I informed her that she may need to be off for seven days.  If you can just let her know when to stop the plavix.  She is ok with stopping for the necessary time.    Thanks.  Dr Nicki Reaper

## 2014-07-13 ENCOUNTER — Encounter: Payer: Self-pay | Admitting: Internal Medicine

## 2014-07-28 ENCOUNTER — Encounter: Payer: Medicare Other | Admitting: Internal Medicine

## 2014-08-03 ENCOUNTER — Encounter: Payer: Medicare Other | Admitting: Internal Medicine

## 2014-08-10 ENCOUNTER — Ambulatory Visit (AMBULATORY_SURGERY_CENTER): Payer: Medicare Other | Admitting: Internal Medicine

## 2014-08-10 ENCOUNTER — Encounter: Payer: Self-pay | Admitting: Internal Medicine

## 2014-08-10 VITALS — BP 94/54 | HR 55 | Temp 97.0°F | Resp 16 | Ht 62.5 in | Wt 132.0 lb

## 2014-08-10 DIAGNOSIS — Z1211 Encounter for screening for malignant neoplasm of colon: Secondary | ICD-10-CM

## 2014-08-10 MED ORDER — SODIUM CHLORIDE 0.9 % IV SOLN: 500.0000 mL | INTRAVENOUS | Status: DC

## 2014-08-10 NOTE — Progress Notes (Signed)
Report to PACU, RN, vss, BBS= Clear.  

## 2014-08-10 NOTE — Patient Instructions (Signed)
Discharge instructions given with verbal understanding. Normal exam. Resume previous medications. YOU HAD AN ENDOSCOPIC PROCEDURE TODAY AT THE [facility]: Refer to the procedure report that was given to you for any specific questions about what was found during the examination.  If the procedure report does not answer your questions, please call your gastroenterologist to clarify.  If you requested that your care partner not be given the details of your procedure findings, then the procedure report has been included in a sealed envelope for you to review at your convenience later.  YOU SHOULD EXPECT: Some feelings of bloating in the abdomen. Passage of more gas than usual.  Walking can help get rid of the air that was put into your GI tract during the procedure and reduce the bloating. If you had a lower endoscopy (such as a colonoscopy or flexible sigmoidoscopy) you may notice spotting of blood in your stool or on the toilet paper. If you underwent a bowel prep for your procedure, then you may not have a normal bowel movement for a few days.  DIET: Your first meal following the procedure should be a light meal and then it is ok to progress to your normal diet.  A half-sandwich or bowl of soup is an example of a good first meal.  Heavy or fried foods are harder to digest and may make you feel nauseous or bloated.  Likewise meals heavy in dairy and vegetables can cause extra gas to form and this can also increase the bloating.  Drink plenty of fluids but you should avoid alcoholic beverages for 24 hours.  ACTIVITY: Your care partner should take you home directly after the procedure.  You should plan to take it easy, moving slowly for the rest of the day.  You can resume normal activity the day after the procedure however you should NOT DRIVE or use heavy machinery for 24 hours (because of the sedation medicines used during the test).    SYMPTOMS TO REPORT IMMEDIATELY: A gastroenterologist  can be reached at any hour.  During normal business hours, 8:30 AM to 5:00 PM Monday through Friday, call [phone].  After hours and on weekends, please call the GI answering service at [phone] who will take a message and have the physician on call contact you.   Following lower endoscopy (colonoscopy or flexible sigmoidoscopy):  Excessive amounts of blood in the stool  Significant tenderness or worsening of abdominal pains  Swelling of the abdomen that is new, acute  Fever of 100F or higher  FOLLOW UP: If any biopsies were taken you will be contacted by phone or by letter within the next 1-3 weeks.  Call your gastroenterologist if you have not heard about the biopsies in 3 weeks.  Our staff will call the home number listed on your records the next business day following your procedure to check on you and address any questions or concerns that you may have at that time regarding the information given to you following your procedure. This is a courtesy call and so if there is no answer at the home number and we have not heard from you through the emergency physician on call, we will assume that you have returned to your regular daily activities without incident.  SIGNATURES/CONFIDENTIALITY: You and/or your care partner have signed paperwork which will be entered into your electronic medical record.  These signatures attest to the fact that that the information above on your After Visit Summary has been reviewed   and is understood.  Full responsibility of the confidentiality of this discharge information lies with you and/or your care-partner. 

## 2014-08-10 NOTE — Op Note (Signed)
[place]  [address] [place], [id number]   COLONOSCOPY PROCEDURE REPORT  PATIENT: Joan Ritter, Joan Ritter  MR#: [id number] BIRTHDATE: Jun 18, 1946 , 68  yrs. old GENDER: female ENDOSCOPIST: Jerene Bears, MD REFERRED BY: Einar Pheasant, MD PROCEDURE DATE:  08/10/2014 PROCEDURE:   Colonoscopy, screening First Screening Colonoscopy - Avg.  risk and is 50 yrs.  old or older - No.  Prior Negative Screening - Now for repeat screening. 10 or more years since last screening  History of Adenoma - Now for follow-up colonoscopy & has been > or = to 3 yrs.  N/A  Polyps Removed Today? No.  Polyps Removed Today? No.  Recommend repeat exam, <10 yrs? Polyps Removed Today? No.  Recommend repeat exam, <10 yrs? No. ASA CLASS:   Class II INDICATIONS: average risk screening for colorectal cancer, last colonoscopy completed  10 years ago. MEDICATIONS: Monitored anesthesia care and Propofol 200 mg IV  DESCRIPTION OF PROCEDURE:   After the risks benefits and alternatives of the procedure were thoroughly explained, informed consent was obtained.  The digital rectal exam revealed no abnormalities of the rectum.   The LB [id number]  endoscope was introduced through the anus and advanced to the cecum, which was identified by both the appendix and ileocecal valve. No adverse events experienced.   The quality of the prep was good, using MoviPrep  The instrument was then slowly withdrawn as the colon was fully examined.      COLON FINDINGS: A normal appearing cecum, ileocecal valve, and appendiceal orifice were identified.  The ascending, transverse, descending, sigmoid colon, and rectum appeared unremarkable. Retroflexed views revealed no abnormalities. The time to cecum=2 minutes 47 seconds.  Withdrawal time=8 minutes 48 seconds.  The scope was withdrawn and the procedure completed.  COMPLICATIONS: There were no immediate complications.  ENDOSCOPIC IMPRESSION: Normal  colonoscopy  RECOMMENDATIONS: You should continue to follow colorectal cancer screening guidelines for "routine risk" patients with a repeat colonoscopy in 10 years. There is no need for FOBT (stool) testing for at least 5 years.  eSigned:  Jerene Bears, MD 08/10/2014 3:37 PM   cc: The Patient; Einar Pheasant, MD

## 2014-08-11 ENCOUNTER — Telehealth: Payer: Self-pay

## 2014-08-11 NOTE — Telephone Encounter (Signed)
  Follow up Call-  Call back number 08/10/2014  Post procedure Call Back phone  # [phone]  Permission to leave phone message Yes     Patient questions:  Do you have a fever, pain , or abdominal swelling? No. Pain Score  0 *  Have you tolerated food without any problems? Yes.    Have you been able to return to your normal activities? No.  Do you have any questions about your discharge instructions: Diet   No. Medications  No. Follow up visit  No.  Do you have questions or concerns about your Care? No.  Actions: * If pain score is 4 or above: No action needed, pain <4.

## 2014-08-19 ENCOUNTER — Encounter: Payer: Self-pay | Admitting: Internal Medicine

## 2014-09-17 ENCOUNTER — Other Ambulatory Visit: Payer: Self-pay | Admitting: Internal Medicine

## 2014-10-08 ENCOUNTER — Other Ambulatory Visit: Payer: Self-pay | Admitting: Internal Medicine

## 2014-10-09 ENCOUNTER — Ambulatory Visit (INDEPENDENT_AMBULATORY_CARE_PROVIDER_SITE_OTHER): Payer: Medicare Other | Admitting: Internal Medicine

## 2014-10-09 ENCOUNTER — Encounter: Payer: Self-pay | Admitting: Internal Medicine

## 2014-10-09 VITALS — BP 90/60 | HR 64 | Temp 98.0°F | Ht 62.5 in | Wt 133.5 lb

## 2014-10-09 DIAGNOSIS — E7800 Pure hypercholesterolemia, unspecified: Secondary | ICD-10-CM

## 2014-10-09 DIAGNOSIS — K219 Gastro-esophageal reflux disease without esophagitis: Secondary | ICD-10-CM

## 2014-10-09 DIAGNOSIS — D179 Benign lipomatous neoplasm, unspecified: Secondary | ICD-10-CM

## 2014-10-09 DIAGNOSIS — G459 Transient cerebral ischemic attack, unspecified: Secondary | ICD-10-CM

## 2014-10-09 DIAGNOSIS — M8580 Other specified disorders of bone density and structure, unspecified site: Secondary | ICD-10-CM

## 2014-10-09 DIAGNOSIS — Z1239 Encounter for other screening for malignant neoplasm of breast: Secondary | ICD-10-CM

## 2014-10-09 DIAGNOSIS — Z0000 Encounter for general adult medical examination without abnormal findings: Secondary | ICD-10-CM | POA: Insufficient documentation

## 2014-10-09 LAB — LIPID PANEL
CHOLESTEROL: 198 mg/dL (ref 0–200)
HDL: 60.3 mg/dL (ref >39.00)
LDL Cholesterol: 125 mg/dL — ABNORMAL HIGH (ref 0–99)
NonHDL: 137.7
TRIGLYCERIDES: 63 mg/dL (ref 0.0–149.0)
Total CHOL/HDL Ratio: 3
VLDL: 12.6 mg/dL (ref 0.0–40.0)

## 2014-10-09 LAB — HEPATIC FUNCTION PANEL
ALT: 21 U/L (ref 0–35)
AST: 24 U/L (ref 0–37)
Albumin: 3.9 g/dL (ref 3.5–5.2)
Alkaline Phosphatase: 53 U/L (ref 39–117)
BILIRUBIN DIRECT: 0 mg/dL (ref 0.0–0.3)
BILIRUBIN TOTAL: 0.5 mg/dL (ref 0.2–1.2)
Total Protein: 6.6 g/dL (ref 6.0–8.3)

## 2014-10-09 LAB — BASIC METABOLIC PANEL
BUN: 18 mg/dL (ref 6–23)
CALCIUM: 9.1 mg/dL (ref 8.4–10.5)
CO2: 28 mEq/L (ref 19–32)
Chloride: 104 mEq/L (ref 96–112)
Creatinine, Ser: 0.9 mg/dL (ref 0.4–1.2)
GFR: 64.36 mL/min (ref >60.00)
Glucose, Bld: 91 mg/dL (ref 70–99)
Potassium: 4.3 mEq/L (ref 3.5–5.1)
SODIUM: 139 meq/L (ref 135–145)

## 2014-10-09 NOTE — Progress Notes (Signed)
Pre visit review using our clinic review tool, if applicable. No additional management support is needed unless otherwise documented below in the visit note. 

## 2014-10-11 ENCOUNTER — Encounter: Payer: Self-pay | Admitting: Internal Medicine

## 2014-10-13 NOTE — Telephone Encounter (Signed)
Unread mychart message mailed to patient 

## 2014-10-18 ENCOUNTER — Encounter: Payer: Self-pay | Admitting: Internal Medicine

## 2014-10-18 NOTE — Progress Notes (Signed)
Subjective:    Patient ID: Joan Ritter, female    DOB: 11/23/1945, 68 y.o.   MRN: [id number]  HPI The patient is here for annual Medicare wellness examination and management of other chronic and acute problems.   The risk factors are reflected in the social history.  She declines smoking and states has never smoked.  No alcohol or drug use.    The roster of all physicians providing medical care to patient -  Dr Ellin Mayhew - optometrist.  Dr Gilford Rile (Dr Juleen China) - dentist.   Dr Hilarie Fredrickson - gastroenterologist.   Activities of daily living:  The patient is 100% independent in all ADLs: dressing, toileting, feeding as well as independent mobility  Home safety : The patient has smoke detectors in the home. She wears seatbelts.  There are no firearms at home. There is no violence in the home.   There is no risks for hepatitis, STDs or HIV. There is no history of blood transfusion. She has no travel history to infectious disease endemic areas of the world.  The patient has seen her dentist in the last six month. She has seen her eye doctor in the last year. She admits to some occasional ringing in her ears.  Sometimes has difficulty hearing soft or whispered voices.  She has had audiologic testing previously by ENT.  She does not  have excessive sun exposure. Discussed the need for sun protection: hats, long sleeves and use of sunscreen if there is significant sun exposure.   Diet: the importance of a healthy diet is discussed. She does have a healthy diet.  The benefits of regular aerobic exercise were discussed. She does exercise regularly.  Goes to [employer].  Planning to get back in the gym.   Depression screen: there are no signs or vegative symptoms of depression- irritability, change in appetite, anhedonia, sadness/tearfullness.  Cognitive assessment: the patient manages all her financial and personal affairs and is actively engaged. She could relate day,date,year and events; recalled 3/3  objects at 3 minutes; performed clock-face test normally.  The following portions of the patient's history were reviewed and updated as appropriate: allergies, current medications, past family history, past medical history,  past surgical history, past social history  and problem list.  Visual acuity was not assessed since she has regular follow up with her optometrist. Hearing and body mass index were assessed and reviewed.   During the course of the visit the patient was educated and counseled about appropriate screening and preventive services including : fall prevention , diabetes screening, nutrition counseling, colorectal cancer screening, and recommended immunizations.    She was given an updated list of screening requirements and recommendations for women her age.    Review of Systems Patient denies any headache, lightheadedness or dizziness.  No sinus or allergy symptoms.  No chest pain, tightness or palpitations.  No increased shortness of breath, cough or congestion.  No nausea or vomiting.  No abdominal pain or cramping.  No bowel change, such as diarrhea, constipation, BRBPR or melana.  No urine change.   Overall she feels she is doing well.  Stays active.      Objective:   Physical Exam Filed Vitals:   10/09/14 0807  BP: 90/60  Pulse: 64  Temp: 98 F (58.18 C)   68 year old female in no acute distress.   HEENT:  Nares- clear.  Oropharynx - without lesions. NECK:  Supple.  Nontender.  No audible bruit.  HEART:  Appears to  be regular. LUNGS:  No crackles or wheezing audible.  Respirations even and unlabored.  RADIAL PULSE:  Equal bilaterally.  ABDOMEN:  Soft, nontender.  Bowel sounds present and normal.  No audible abdominal bruit.  EXTREMITIES:  No increased edema present.  DP pulses palpable and equal bilaterally.          Assessment & Plan:  This is a routine wellness examination for this patient.  I reviewed all health maintenance protocols including mammography,  colonoscopy, bone density. Needed referrals placed. Age and diagnosis appropriate screening labs were ordered.  Her immunization history was reviewed and appropriate vaccinations were ordered.  Her current medications and allergies were reviewed and refills of her chronic medications were ordered if needed.  The plan for yearly health maintenance was discussed.  She was given a list of screening recommendations for women her age.     MEDICARE ATTESTATION I have personally reviewed:  The patient's medical and social history. The use of alcohol, tobacco and illicit drugs. The current medications and supplements. The patient's function ability including ADLs, fall risks, home safety risks, cognitive, and hearing and visual impairment.   Diet and physical activities. Evaluation for depression and mood disorders.    The patient's weight, height, BMI have been recorded in the chart.  I have made referrals, counseled and provided education to the patient based on review of the above.   Other medical problems addressed:    1. Transient cerebral ischemia, unspecified transient cerebral ischemia type On aspirin and plavix and has done well.  No reoccurring symptoms.  Follow.  2. Gastroesophageal reflux disease without esophagitis Controlled.  On ranitidine.    3. Hypercholesterolemia On simvastatin.  Low cholesterol diet and exercise.  Follow lipid panel and liver function. - Lipid panel - Hepatic function panel  4. Breast cancer screening - MM DIGITAL SCREENING BILATERAL; Future  5. Medicare annual wellness visit, subsequent Performed today.  See above.    6. Osteopenia Have discussed bone density.  She will notify me when agreeable.    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  Felt no further w/up warranted.    HEALTH MAINTENANCE.  Physical 04/16/14.  Mammogram 12.19.14 - Birads I.  Schedule f/u mammogram.  Colonoscopy 08/10/14 - normal.  Has had shingles vaccine.  Updated immunization  record.  Discussed the need for tetanus.  Will check with her insurance.

## 2014-10-30 ENCOUNTER — Encounter: Payer: Self-pay | Admitting: Internal Medicine

## 2014-11-06 ENCOUNTER — Ambulatory Visit: Payer: Self-pay | Admitting: Internal Medicine

## 2014-11-06 LAB — HM MAMMOGRAPHY: HM MAMMO: NEGATIVE

## 2015-03-27 ENCOUNTER — Other Ambulatory Visit: Payer: Self-pay | Admitting: Internal Medicine

## 2015-04-10 ENCOUNTER — Other Ambulatory Visit: Payer: Self-pay | Admitting: Internal Medicine

## 2015-04-20 ENCOUNTER — Encounter: Payer: Self-pay | Admitting: Internal Medicine

## 2015-04-20 ENCOUNTER — Ambulatory Visit (INDEPENDENT_AMBULATORY_CARE_PROVIDER_SITE_OTHER): Payer: Medicare Other | Admitting: Internal Medicine

## 2015-04-20 ENCOUNTER — Telehealth: Payer: Self-pay | Admitting: Internal Medicine

## 2015-04-20 VITALS — BP 90/50 | HR 52 | Temp 98.5°F | Ht 62.25 in | Wt 133.5 lb

## 2015-04-20 DIAGNOSIS — G459 Transient cerebral ischemic attack, unspecified: Secondary | ICD-10-CM

## 2015-04-20 DIAGNOSIS — Z79899 Other long term (current) drug therapy: Secondary | ICD-10-CM

## 2015-04-20 DIAGNOSIS — K219 Gastro-esophageal reflux disease without esophagitis: Secondary | ICD-10-CM

## 2015-04-20 DIAGNOSIS — R7989 Other specified abnormal findings of blood chemistry: Secondary | ICD-10-CM

## 2015-04-20 DIAGNOSIS — E7800 Pure hypercholesterolemia, unspecified: Secondary | ICD-10-CM

## 2015-04-20 DIAGNOSIS — M8580 Other specified disorders of bone density and structure, unspecified site: Secondary | ICD-10-CM

## 2015-04-20 DIAGNOSIS — M25561 Pain in right knee: Secondary | ICD-10-CM

## 2015-04-20 DIAGNOSIS — M25562 Pain in left knee: Secondary | ICD-10-CM

## 2015-04-20 DIAGNOSIS — Z0000 Encounter for general adult medical examination without abnormal findings: Secondary | ICD-10-CM

## 2015-04-20 LAB — LIPID PANEL
Cholesterol: 185 mg/dL (ref 0–200)
HDL: 55 mg/dL (ref >39.00)
LDL Cholesterol: 113 mg/dL — ABNORMAL HIGH (ref 0–99)
NonHDL: 130
TRIGLYCERIDES: 87 mg/dL (ref 0.0–149.0)
Total CHOL/HDL Ratio: 3
VLDL: 17.4 mg/dL (ref 0.0–40.0)

## 2015-04-20 LAB — CBC WITH DIFFERENTIAL/PLATELET
Basophils Absolute: 0 10*3/uL (ref 0.0–0.1)
Basophils Relative: 0.8 % (ref 0.0–3.0)
EOS PCT: 4.5 % (ref 0.0–5.0)
Eosinophils Absolute: 0.3 10*3/uL (ref 0.0–0.7)
HEMATOCRIT: 40.2 % (ref 36.0–46.0)
HEMOGLOBIN: 13.8 g/dL (ref 12.0–15.0)
LYMPHS ABS: 1.8 10*3/uL (ref 0.7–4.0)
Lymphocytes Relative: 31.5 % (ref 12.0–46.0)
MCHC: 34.3 g/dL (ref 30.0–36.0)
MCV: 91.9 fl (ref 78.0–100.0)
MONO ABS: 0.6 10*3/uL (ref 0.1–1.0)
Monocytes Relative: 9.6 % (ref 3.0–12.0)
NEUTROS ABS: 3.1 10*3/uL (ref 1.4–7.7)
Neutrophils Relative %: 53.6 % (ref 43.0–77.0)
Platelets: 187 10*3/uL (ref 150.0–400.0)
RBC: 4.37 Mil/uL (ref 3.87–5.11)
RDW: 13 % (ref 11.5–15.5)
WBC: 5.9 10*3/uL (ref 4.0–10.5)

## 2015-04-20 LAB — HEPATIC FUNCTION PANEL
ALBUMIN: 3.7 g/dL (ref 3.5–5.2)
ALK PHOS: 58 U/L (ref 39–117)
ALT: 18 U/L (ref 0–35)
AST: 22 U/L (ref 0–37)
Bilirubin, Direct: 0.1 mg/dL (ref 0.0–0.3)
Total Bilirubin: 0.5 mg/dL (ref 0.2–1.2)
Total Protein: 5.9 g/dL — ABNORMAL LOW (ref 6.0–8.3)

## 2015-04-20 LAB — TSH: TSH: 4.68 u[IU]/mL — ABNORMAL HIGH (ref 0.35–4.50)

## 2015-04-20 LAB — BASIC METABOLIC PANEL
BUN: 13 mg/dL (ref 6–23)
CALCIUM: 8.8 mg/dL (ref 8.4–10.5)
CO2: 29 meq/L (ref 19–32)
CREATININE: 0.87 mg/dL (ref 0.40–1.20)
Chloride: 107 mEq/L (ref 96–112)
GFR: 68.54 mL/min (ref >60.00)
Glucose, Bld: 81 mg/dL (ref 70–99)
Potassium: 3.6 mEq/L (ref 3.5–5.1)
Sodium: 143 mEq/L (ref 135–145)

## 2015-04-20 NOTE — Assessment & Plan Note (Signed)
EGD 07/18/11 - reflux esophagitis.  Treated for h. Pylori.  Currently doing well.

## 2015-04-20 NOTE — Progress Notes (Signed)
Patient ID: LAYZA SUMMA, female   DOB: [birth year], 69 y.o.   MRN: [id number]   Subjective:    Patient ID: Windy Kalata, female    DOB: [birth year], 69 y.o.   MRN: [id number]  HPI  Patient here to follow up on her current medical issues as well as for a complete physical exam.  Stays active.  Exercises.  No cardiac symptoms with increased activity or exertion.  Breathing stable.  No sob.  Knees bother her occasionally.  States when she sits for a while and then stands - worse at this time.  Bowels stable.     Past Medical History  Diagnosis Date  . TIA (transient ischemic attack) 2001  . Hypercholesterolemia   . GERD (gastroesophageal reflux disease)   . Allergic rhinitis     seasonal  . History of Helicobacter pylori infection 2012    Current Outpatient Prescriptions on File Prior to Visit  Medication Sig Dispense Refill  . acyclovir (ZOVIRAX) 800 MG tablet Take 800 mg by mouth as needed.    Marland Kitchen aspirin 81 MG tablet Take 81 mg by mouth daily.    . Calcium Carbonate-Vit D-Min 600-400 MG-UNIT TABS Take 1 tablet by mouth 2 (two) times daily.    . cetirizine (ZYRTEC) 10 MG tablet Take 10 mg by mouth daily as needed.     . clopidogrel (PLAVIX) 75 MG tablet TAKE 1 TABLET BY MOUTH DAILY 30 tablet 5  . fluticasone (FLONASE) 50 MCG/ACT nasal spray USE 2 SPRAYS EACH NOSTRIL EVERY DAY (Patient taking differently: USE 2 SPRAYS EACH NOSTRIL DAILY AS NEEDED) 16 g 5  . Multiple Vitamin (MULTIVITAMIN) tablet Take 1 tablet by mouth daily.    Marland Kitchen omeprazole (PRILOSEC) 40 MG capsule Take 40 mg by mouth every morning.    . ranitidine (ZANTAC) 150 MG tablet Take 150 mg by mouth at bedtime.    . simvastatin (ZOCOR) 10 MG tablet TAKE 1 TABLET BY MOUTH AT BEDTIME. 90 tablet 1   No current facility-administered medications on file prior to visit.    Review of Systems  Constitutional: Negative for appetite change and unexpected weight change.  HENT: Negative for congestion and sinus pressure.   Eyes:  Negative for pain and visual disturbance.  Respiratory: Negative for cough, chest tightness and shortness of breath.   Cardiovascular: Negative for chest pain, palpitations and leg swelling.  Gastrointestinal: Negative for nausea, vomiting, abdominal pain and diarrhea.  Genitourinary: Negative for dysuria and difficulty urinating.  Musculoskeletal: Negative for back pain.       Bilateral knee pain intermittently as outlined.  Does not limit her activity.    Skin: Negative for color change and rash.  Neurological: Negative for dizziness, light-headedness and headaches.  Hematological: Negative for adenopathy. Does not bruise/bleed easily.  Psychiatric/Behavioral: Negative for dysphoric mood and agitation.       Objective:     Blood pressure recheck:  118/64  Physical Exam  Constitutional: She is oriented to person, place, and time. She appears well-developed and well-nourished.  HENT:  Nose: Nose normal.  Mouth/Throat: Oropharynx is clear and moist.  Eyes: Right eye exhibits no discharge. Left eye exhibits no discharge. No scleral icterus.  Neck: Neck supple. No thyromegaly present.  Cardiovascular: Normal rate and regular rhythm.   Pulmonary/Chest: Breath sounds normal. No accessory muscle usage. No tachypnea. No respiratory distress. She has no decreased breath sounds. She has no wheezes. She has no rhonchi. Right breast exhibits no inverted nipple, no mass, no nipple discharge  and no tenderness (no axillary adenopathy). Left breast exhibits no inverted nipple, no mass, no nipple discharge and no tenderness (no axilarry adenopathy).  Abdominal: Soft. Bowel sounds are normal. There is no tenderness.  Musculoskeletal: She exhibits no edema or tenderness.  Lymphadenopathy:    She has no cervical adenopathy.  Neurological: She is alert and oriented to person, place, and time.  Skin: Skin is warm. No rash noted.  Psychiatric: She has a normal mood and affect. Her behavior is normal.     BP 90/50 mmHg  Pulse 52  Temp(Src) 98.5 F (36.9 C) (Oral)  Ht 5' 2.25" (1.581 m)  Wt 133 lb 8 oz (60.555 kg)  BMI 24.23 kg/m2  SpO2 97% Wt Readings from Last 3 Encounters:  04/20/15 133 lb 8 oz (60.555 kg)  10/09/14 133 lb 8 oz (60.555 kg)  08/10/14 132 lb (59.875 kg)     Lab Results  Component Value Date   WBC 6.3 05/01/2014   HGB 13.7 05/01/2014   HCT 40.7 05/01/2014   PLT 211.0 05/01/2014   GLUCOSE 91 10/09/2014   CHOL 198 10/09/2014   TRIG 63.0 10/09/2014   HDL 60.30 10/09/2014   LDLCALC 125* 10/09/2014   ALT 21 10/09/2014   AST 24 10/09/2014   NA 139 10/09/2014   K 4.3 10/09/2014   CL 104 10/09/2014   CREATININE 0.9 10/09/2014   BUN 18 10/09/2014   CO2 28 10/09/2014   TSH 2.31 05/01/2014       Assessment & Plan:   Problem List Items Addressed This Visit    GERD (gastroesophageal reflux disease)    EGD 07/18/11 - reflux esophagitis.  Treated for h. Pylori.  Currently doing well.        Health care maintenance    Physical today 04/20/15.  Mammogram 11/06/14 - Birads I.  Colonoscopy 08/10/14 - normal.  Recommended f/u colonoscopy in 10 years.  Schedule bone density.       Hypercholesterolemia    On simvastatin.  Low cholesterol diet and exercise.  Follow lipid panel and liver function tests.        Relevant Orders   Lipid panel   Hepatic function panel   Basic metabolic panel   Knee pain, bilateral - Primary    Occurs after she has been sitting for a while.  Walking and moving - helps.  Has been using ibuprofen.  Change to tylenol given on plavix.  Follow.        Osteopenia    Previous bone density revealed osteopenia.  Has been several years.  Schedule bone density.       Relevant Orders   DG Bone Density   TSH   TIA (transient ischemic attack)    Maintained on plavix and aspirin and doing well.        Relevant Orders   CBC with Differential/Platelet       Einar Pheasant, MD

## 2015-04-20 NOTE — Assessment & Plan Note (Signed)
Physical today 04/20/15.  Mammogram 11/06/14 - Birads I.  Colonoscopy 08/10/14 - normal.  Recommended f/u colonoscopy in 10 years.  Schedule bone density.

## 2015-04-20 NOTE — Assessment & Plan Note (Signed)
Previous bone density revealed osteopenia.  Has been several years.  Schedule bone density.

## 2015-04-20 NOTE — Assessment & Plan Note (Signed)
Maintained on plavix and aspirin and doing well.

## 2015-04-20 NOTE — Assessment & Plan Note (Addendum)
Occurs after she has been sitting for a while.  Walking and moving - helps.  Has been using ibuprofen.  Change to tylenol given on plavix.  Follow.

## 2015-04-20 NOTE — Telephone Encounter (Signed)
Pt was notified of labs via my chart.  Needs a f/u non fasting lab appt in one month.  Please notify the patient of the appointment date and time.  Thanks.

## 2015-04-20 NOTE — Addendum Note (Signed)
Addended by: Karlene Einstein D on: 04/20/2015 09:17 AM   Modules accepted: Orders

## 2015-04-20 NOTE — Progress Notes (Signed)
Pre visit review using our clinic review tool, if applicable. No additional management support is needed unless otherwise documented below in the visit note. 

## 2015-04-20 NOTE — Assessment & Plan Note (Signed)
On simvastatin.  Low cholesterol diet and exercise.  Follow lipid panel and liver function tests.   

## 2015-05-21 ENCOUNTER — Encounter: Payer: Self-pay | Admitting: Internal Medicine

## 2015-05-21 ENCOUNTER — Other Ambulatory Visit (INDEPENDENT_AMBULATORY_CARE_PROVIDER_SITE_OTHER): Payer: Medicare Other

## 2015-05-21 DIAGNOSIS — R7989 Other specified abnormal findings of blood chemistry: Secondary | ICD-10-CM

## 2015-05-21 DIAGNOSIS — R946 Abnormal results of thyroid function studies: Secondary | ICD-10-CM | POA: Diagnosis not present

## 2015-05-21 LAB — TSH: TSH: 2.97 u[IU]/mL (ref 0.35–4.50)

## 2015-05-26 ENCOUNTER — Ambulatory Visit: Payer: Medicare Other

## 2015-06-01 ENCOUNTER — Ambulatory Visit
Admission: RE | Admit: 2015-06-01 | Discharge: 2015-06-01 | Disposition: A | Discharge status: Home / Self Care | Payer: Medicare Other | Source: Ambulatory Visit | Attending: Internal Medicine | Admitting: Internal Medicine

## 2015-06-01 DIAGNOSIS — M85851 Other specified disorders of bone density and structure, right thigh: Secondary | ICD-10-CM | POA: Diagnosis not present

## 2015-06-01 DIAGNOSIS — M8580 Other specified disorders of bone density and structure, unspecified site: Secondary | ICD-10-CM | POA: Diagnosis present

## 2015-06-02 ENCOUNTER — Encounter: Payer: Self-pay | Admitting: Internal Medicine

## 2015-09-14 ENCOUNTER — Other Ambulatory Visit: Payer: Self-pay

## 2015-09-14 MED ORDER — CLOPIDOGREL BISULFATE 75 MG PO TABS: 75.0000 mg | ORAL_TABLET | Freq: Every day | ORAL | Status: DC

## 2015-10-05 ENCOUNTER — Other Ambulatory Visit: Payer: Self-pay | Admitting: Internal Medicine

## 2015-11-02 ENCOUNTER — Ambulatory Visit (INDEPENDENT_AMBULATORY_CARE_PROVIDER_SITE_OTHER): Payer: Medicare Other | Admitting: Internal Medicine

## 2015-11-02 ENCOUNTER — Ambulatory Visit: Payer: Medicare Other | Admitting: Internal Medicine

## 2015-11-02 ENCOUNTER — Encounter: Payer: Self-pay | Admitting: Internal Medicine

## 2015-11-02 VITALS — BP 110/70 | HR 65 | Temp 98.0°F | Resp 18 | Ht 62.25 in | Wt 135.1 lb

## 2015-11-02 DIAGNOSIS — E7800 Pure hypercholesterolemia, unspecified: Secondary | ICD-10-CM | POA: Diagnosis not present

## 2015-11-02 DIAGNOSIS — Z1239 Encounter for other screening for malignant neoplasm of breast: Secondary | ICD-10-CM | POA: Diagnosis not present

## 2015-11-02 DIAGNOSIS — M8580 Other specified disorders of bone density and structure, unspecified site: Secondary | ICD-10-CM | POA: Diagnosis not present

## 2015-11-02 DIAGNOSIS — R7989 Other specified abnormal findings of blood chemistry: Secondary | ICD-10-CM

## 2015-11-02 DIAGNOSIS — K219 Gastro-esophageal reflux disease without esophagitis: Secondary | ICD-10-CM

## 2015-11-02 DIAGNOSIS — R946 Abnormal results of thyroid function studies: Secondary | ICD-10-CM

## 2015-11-02 DIAGNOSIS — Z8673 Personal history of transient ischemic attack (TIA), and cerebral infarction without residual deficits: Secondary | ICD-10-CM

## 2015-11-02 NOTE — Progress Notes (Signed)
Patient ID: SARABELLE BELLAVANCE, female   DOB: [birth year], 70 y.o.   MRN: [id number]   Subjective:    Patient ID: Windy Kalata, female    DOB: 1946/09/16, 70 y.o.   MRN: [id number]  HPI  Patient with past history of hypercholesterolemia, TIA and GERD.  She comes in today to follow up on these issues.  She is doing well.  Feels good.  Stays active.  Goes to the gym.  No cardiac symptoms with increased activity or exertion.  No sob.  Still has some issues with some possible reflux and swallowing.  On prilosec and zantac.  No abdominal pain or cramping.  Bowels stable.  Just had colonoscopy 08/10/14.  Recommended f/u colonoscopy in 10 years.     Past Medical History  Diagnosis Date  . TIA (transient ischemic attack) 2001  . Hypercholesterolemia   . GERD (gastroesophageal reflux disease)   . Allergic rhinitis     seasonal  . History of Helicobacter pylori infection 2012   Past Surgical History  Procedure Laterality Date  . Breast biopsy  1970s and 2000    x2  . Hemorrhoid surgery  1980s  . Colonoscopy  2005    normal   . Esophagogastroduodenoscopy  2012    normal    Family History  Problem Relation Age of Onset  . Lymphoma Father   . CVA Mother     h/o cerebral hemorrhage  . Brain cancer Brother   . Cardiomyopathy Sister     enlarged heart, died age 42 - sudden death  . Breast cancer Maternal Aunt     x2  . Colon cancer Neg Hx    Social History   Social History  . Marital Status: Married    Spouse Name: N/A  . Number of Children: 2  . Years of Education: N/A   Social History Main Topics  . Smoking status: Never Smoker   . Smokeless tobacco: Never Used  . Alcohol Use: No  . Drug Use: No  . Sexual Activity: Not Asked   Other Topics Concern  . None   Social History Narrative    Outpatient Encounter Prescriptions as of 11/02/2015  Medication Sig  . acyclovir (ZOVIRAX) 800 MG tablet Take 800 mg by mouth as needed.  Marland Kitchen aspirin 81 MG tablet Take 81 mg by mouth daily.  .  Calcium Carbonate-Vit D-Min 600-400 MG-UNIT TABS Take 1 tablet by mouth 2 (two) times daily.  . cetirizine (ZYRTEC) 10 MG tablet Take 10 mg by mouth daily as needed.   . clopidogrel (PLAVIX) 75 MG tablet Take 1 tablet (75 mg total) by mouth daily.  . fluticasone (FLONASE) 50 MCG/ACT nasal spray USE 2 SPRAYS EACH NOSTRIL EVERY DAY (Patient taking differently: USE 2 SPRAYS EACH NOSTRIL DAILY AS NEEDED)  . Multiple Vitamin (MULTIVITAMIN) tablet Take 1 tablet by mouth daily.  Marland Kitchen omeprazole (PRILOSEC) 40 MG capsule Take 40 mg by mouth every morning.  . ranitidine (ZANTAC) 150 MG tablet Take 150 mg by mouth at bedtime.  . simvastatin (ZOCOR) 10 MG tablet TAKE 1 TABLET BY MOUTH AT BEDTIME.   No facility-administered encounter medications on file as of 11/02/2015.    Review of Systems  Constitutional: Negative for appetite change and unexpected weight change.  HENT: Negative for congestion and sinus pressure.   Eyes: Negative for discharge and redness.  Respiratory: Negative for cough, chest tightness and shortness of breath.   Cardiovascular: Negative for chest pain, palpitations and leg swelling.  Gastrointestinal: Negative  for nausea, vomiting, abdominal pain and diarrhea.  Genitourinary: Negative for dysuria and difficulty urinating.  Musculoskeletal: Negative for back pain and joint swelling.  Skin: Negative for color change and rash.  Neurological: Negative for dizziness, light-headedness and headaches.  Psychiatric/Behavioral: Negative for dysphoric mood and agitation.       Objective:    Physical Exam  Constitutional: She appears well-developed and well-nourished. No distress.  HENT:  Nose: Nose normal.  Mouth/Throat: Oropharynx is clear and moist.  Eyes: Conjunctivae are normal. Right eye exhibits no discharge. Left eye exhibits no discharge.  Neck: Neck supple. No thyromegaly present.  Cardiovascular: Normal rate and regular rhythm.   Pulmonary/Chest: Breath sounds normal. No  respiratory distress. She has no wheezes.  Abdominal: Soft. Bowel sounds are normal. There is no tenderness.  Musculoskeletal: She exhibits no edema or tenderness.  Lymphadenopathy:    She has no cervical adenopathy.  Skin: No rash noted. No erythema.  Psychiatric: She has a normal mood and affect. Her behavior is normal.    BP 110/70 mmHg  Pulse 65  Temp(Src) 98 F (36.7 C) (Oral)  Resp 18  Ht 5' 2.25" (1.581 m)  Wt 135 lb 2 oz (61.292 kg)  BMI 24.52 kg/m2  SpO2 98% Wt Readings from Last 3 Encounters:  11/02/15 135 lb 2 oz (61.292 kg)  04/20/15 133 lb 8 oz (60.555 kg)  10/09/14 133 lb 8 oz (60.555 kg)     Lab Results  Component Value Date   WBC 5.9 04/20/2015   HGB 13.8 04/20/2015   HCT 40.2 04/20/2015   PLT 187.0 04/20/2015   GLUCOSE 81 04/20/2015   CHOL 185 04/20/2015   TRIG 87.0 04/20/2015   HDL 55.00 04/20/2015   LDLCALC 113* 04/20/2015   ALT 18 04/20/2015   AST 22 04/20/2015   NA 143 04/20/2015   K 3.6 04/20/2015   CL 107 04/20/2015   CREATININE 0.87 04/20/2015   BUN 13 04/20/2015   CO2 29 04/20/2015   TSH 2.97 05/21/2015    Dg Bone Density  06/01/2015  EXAM: DUAL X-RAY ABSORPTIOMETRY (DXA) FOR BONE MINERAL DENSITY IMPRESSION: Dear Einar Pheasant, Your patient Ruth Slusher completed a BMD test on 06/01/2015 using the [employer] (analysis version: 14.10) manufactured by [employer]. The following summarizes the results of our evaluation. PATIENT BIOGRAPHICAL: Name: Cerra, Marotta Patient ID: [id number] Birth Date: 1945-11-30 Height: 62.2 in. Gender: Female Exam Date: 06/01/2015 Weight: 132.0 lbs. Indications: Caucasian, Parent Hip Fracture, Postmenopausal Fractures: Treatments: CALCIUM VIT D, Multi-Vitamin with calcium ASSESSMENT: The BMD measured at Femur Neck Right is 0.766 g/cm2 with a T-score of -2.0. This patient is considered osteopenic according to [facility]) criteria. Site Region Measured Measured WHO Young Adult BMD Date        Age      Classification T-score AP Spine L1-L4 6/01/2015 69.5 Normal -0.3 1.152 g/cm2 DualFemur Neck Right 06/01/2015 69.5 Osteopenia -2.0 0.766 g/cm2 World Health Organization [facility]) criteria for post-menopausal, Caucasian Women: Normal:       T-score at or above -1 SD Osteopenia:   T-score between -1 and -2.5 SD Osteoporosis: T-score at or below -2.5 SD RECOMMENDATIONS: [facility] recommends that FDA-approved medical therapies be considered in postmenopausal women and men age 15 or older with a: 1. Hip or vertebral (clinical or morphometric) fracture. 2. T-score of < -2.5 at the spine or hip. 3. Ten-year fracture probability by FRAX of 3% or greater for hip fracture or 20% or greater for  major osteoporotic fracture. All treatment decisions require clinical judgment and consideration of individual patient factors, including patient preferences, co-morbidities, previous drug use, risk factors not captured in the FRAX model (e.g. falls, vitamin D deficiency, increased bone turnover, interval significant decline in bone density) and possible under - or over-estimation of fracture risk by FRAX. All patients should ensure an adequate intake of dietary calcium (1200 mg/d) and vitamin D (800 IU daily) unless contraindicated. FOLLOW-UP: People with diagnosed cases of osteoporosis or at high risk for fracture should have regular bone mineral density tests. For patients eligible for Medicare, routine testing is allowed once every 2 years. The testing frequency can be increased to one year for patients who have rapidly progressing disease, those who are receiving or discontinuing medical therapy to restore bone mass, or have additional risk factors. I have reviewed this report, and agree with the above findings. Electronically Signed   By: David  Martinique M.D.   On: 06/01/2015 13:36       Assessment & Plan:   Problem List Items Addressed This Visit    GERD (gastroesophageal reflux disease) -  Primary    Is on prilosec and zantac.  Still with some upper symptoms.  Will have GI evaluate with question of need for EGD.  Had EGD previously 07/18/11 - reflux esophagitis.  Treated for H. Pylori.        Relevant Orders   Ambulatory referral to Gastroenterology   History of TIA (transient ischemic attack)    Maintained on aspirin and plavix and doing well.  Follow.        Hypercholesterolemia    On simvastatin.  Low cholesterol diet and exercise.  Follow lipid panel and liver function tests.        Relevant Orders   Lipid panel   Hepatic function panel   Basic metabolic panel   Osteopenia    Continue calcium and weight bearing exercise.  Follow.        Relevant Orders   VITAMIN D 25 Hydroxy (Vit-D Deficiency, Fractures)    Other Visit Diagnoses    Screening breast examination        Relevant Orders    MM DIGITAL SCREENING BILATERAL    Elevated TSH        Relevant Orders    TSH        Einar Pheasant, MD

## 2015-11-02 NOTE — Progress Notes (Signed)
Pre-visit discussion using our clinic review tool. No additional management support is needed unless otherwise documented below in the visit note.  

## 2015-11-08 ENCOUNTER — Encounter: Payer: Self-pay | Admitting: Internal Medicine

## 2015-11-08 NOTE — Assessment & Plan Note (Signed)
Is on prilosec and zantac.  Still with some upper symptoms.  Will have GI evaluate with question of need for EGD.  Had EGD previously 07/18/11 - reflux esophagitis.  Treated for H. Pylori.

## 2015-11-08 NOTE — Assessment & Plan Note (Signed)
Maintained on aspirin and plavix and doing well.  Follow.

## 2015-11-08 NOTE — Assessment & Plan Note (Signed)
On simvastatin.  Low cholesterol diet and exercise.  Follow lipid panel and liver function tests.   

## 2015-11-08 NOTE — Assessment & Plan Note (Signed)
Continue calcium and weight bearing exercise.  Follow.

## 2015-11-12 ENCOUNTER — Other Ambulatory Visit: Payer: Self-pay | Admitting: Internal Medicine

## 2015-11-12 ENCOUNTER — Ambulatory Visit
Admission: RE | Admit: 2015-11-12 | Discharge: 2015-11-12 | Disposition: A | Discharge status: Home / Self Care | Payer: Medicare Other | Source: Ambulatory Visit | Attending: Internal Medicine | Admitting: Internal Medicine

## 2015-11-12 DIAGNOSIS — Z1231 Encounter for screening mammogram for malignant neoplasm of breast: Secondary | ICD-10-CM | POA: Insufficient documentation

## 2015-11-12 DIAGNOSIS — Z1239 Encounter for other screening for malignant neoplasm of breast: Secondary | ICD-10-CM

## 2015-11-17 ENCOUNTER — Other Ambulatory Visit (INDEPENDENT_AMBULATORY_CARE_PROVIDER_SITE_OTHER): Payer: Medicare Other

## 2015-11-17 DIAGNOSIS — M8580 Other specified disorders of bone density and structure, unspecified site: Secondary | ICD-10-CM | POA: Diagnosis not present

## 2015-11-17 DIAGNOSIS — E7800 Pure hypercholesterolemia, unspecified: Secondary | ICD-10-CM

## 2015-11-17 DIAGNOSIS — R946 Abnormal results of thyroid function studies: Secondary | ICD-10-CM | POA: Diagnosis not present

## 2015-11-17 DIAGNOSIS — R7989 Other specified abnormal findings of blood chemistry: Secondary | ICD-10-CM

## 2015-11-17 LAB — HEPATIC FUNCTION PANEL
ALBUMIN: 3.9 g/dL (ref 3.5–5.2)
ALK PHOS: 52 U/L (ref 39–117)
ALT: 19 U/L (ref 0–35)
AST: 20 U/L (ref 0–37)
BILIRUBIN DIRECT: 0.1 mg/dL (ref 0.0–0.3)
BILIRUBIN TOTAL: 0.6 mg/dL (ref 0.2–1.2)
Total Protein: 6.6 g/dL (ref 6.0–8.3)

## 2015-11-17 LAB — BASIC METABOLIC PANEL
BUN: 16 mg/dL (ref 6–23)
CHLORIDE: 105 meq/L (ref 96–112)
CO2: 30 meq/L (ref 19–32)
Calcium: 9.4 mg/dL (ref 8.4–10.5)
Creatinine, Ser: 0.97 mg/dL (ref 0.40–1.20)
GFR: 60.35 mL/min (ref >60.00)
GLUCOSE: 99 mg/dL (ref 70–99)
Potassium: 4.5 mEq/L (ref 3.5–5.1)
SODIUM: 141 meq/L (ref 135–145)

## 2015-11-17 LAB — VITAMIN D 25 HYDROXY (VIT D DEFICIENCY, FRACTURES): VITD: 70.32 ng/mL (ref 30.00–100.00)

## 2015-11-17 LAB — LIPID PANEL
CHOL/HDL RATIO: 3
Cholesterol: 191 mg/dL (ref 0–200)
HDL: 63.2 mg/dL (ref >39.00)
LDL Cholesterol: 109 mg/dL — ABNORMAL HIGH (ref 0–99)
NONHDL: 127.86
Triglycerides: 95 mg/dL (ref 0.0–149.0)
VLDL: 19 mg/dL (ref 0.0–40.0)

## 2015-11-17 LAB — TSH: TSH: 3.12 u[IU]/mL (ref 0.35–4.50)

## 2015-11-18 ENCOUNTER — Encounter: Payer: Self-pay | Admitting: Internal Medicine

## 2015-12-20 ENCOUNTER — Encounter: Payer: Self-pay | Admitting: *Deleted

## 2015-12-28 ENCOUNTER — Ambulatory Visit (INDEPENDENT_AMBULATORY_CARE_PROVIDER_SITE_OTHER): Payer: Medicare Other | Admitting: Internal Medicine

## 2015-12-28 ENCOUNTER — Encounter: Payer: Self-pay | Admitting: Internal Medicine

## 2015-12-28 VITALS — BP 90/58 | HR 81 | Ht 63.0 in | Wt 138.0 lb

## 2015-12-28 DIAGNOSIS — Z8619 Personal history of other infectious and parasitic diseases: Secondary | ICD-10-CM

## 2015-12-28 DIAGNOSIS — K219 Gastro-esophageal reflux disease without esophagitis: Secondary | ICD-10-CM

## 2015-12-28 MED ORDER — PANTOPRAZOLE SODIUM 40 MG PO TBEC: 40.0000 mg | DELAYED_RELEASE_TABLET | Freq: Every day | ORAL | Status: DC

## 2015-12-28 NOTE — Progress Notes (Signed)
Subjective:    Patient ID: Joan Ritter, female    DOB: 07-11-1946, 70 y.o.   MRN: [id number]  HPI Joan Ritter is a 70 year old female with a past medical history of GERD, history of H. pylori status post treatment who is seen in follow-up on advice from Dr. Nicki Reaper to discuss reflux. She was last seen at the time of screening colonoscopy which was performed on 08/10/2014. This was normal. She has a history of reflux esophagitis is seen at endoscopy in 2012 performed by Dr. Candace Cruise.  Biopsies of the GE junction were negative for metaplasia but did show H. pylori organisms. She was treated with Pylera.  To her knowledge there is no documentation of eradication of infection.  She reports that she has some issues with reflux which for her is tasting food several hours after she eats it and some regurgitation. She denies true heartburn. No dysphagia or odynophagia. Symptoms worse with overeating. Denies early satiety. No weight loss. No nausea or vomiting. Regular bowel movements without blood in her stool or melena. No abdominal pain. No family history of GI tract malignancy  Review of Systems As per history of present illness, otherwise negative  Current Medications, Allergies, Past Medical History, Past Surgical History, Family History and Social History were reviewed in [employer] record.     Objective:   Physical Exam BP 90/58 mmHg  Pulse 81  Ht 5\' 3"  (1.6 m)  Wt 138 lb (62.596 kg)  BMI 24.45 kg/m2 Constitutional: Well-developed and well-nourished. No distress. HEENT: Normocephalic and atraumatic. Oropharynx is clear and moist. No oropharyngeal exudate. Conjunctivae are normal.  No scleral icterus. Neck: Neck supple. Trachea midline. Cardiovascular: Normal rate, regular rhythm and intact distal pulses. No M/R/G Pulmonary/chest: Effort normal and breath sounds normal. No wheezing, rales or rhonchi. Abdominal: Soft, nontender, nondistended. Bowel sounds active throughout.  There are no masses palpable. No hepatosplenomegaly. Extremities: no clubbing, cyanosis, or edema Lymphadenopathy: No cervical adenopathy noted. Neurological: Alert and oriented to person place and time. Skin: Skin is warm and dry.  Psychiatric: Normal mood and affect. Behavior is normal.  CBC    Component Value Date/Time   WBC 5.9 04/20/2015 0917   RBC 4.37 04/20/2015 0917   HGB 13.8 04/20/2015 0917   HCT 40.2 04/20/2015 0917   PLT 187.0 04/20/2015 0917   MCV 91.9 04/20/2015 0917   MCHC 34.3 04/20/2015 0917   RDW 13.0 04/20/2015 0917   LYMPHSABS 1.8 04/20/2015 0917   MONOABS 0.6 04/20/2015 0917   EOSABS 0.3 04/20/2015 0917   BASOSABS 0.0 04/20/2015 0917    CMP     Component Value Date/Time   NA 141 11/17/2015 0956   K 4.5 11/17/2015 0956   CL 105 11/17/2015 0956   CO2 30 11/17/2015 0956   GLUCOSE 99 11/17/2015 0956   BUN 16 11/17/2015 0956   CREATININE 0.97 11/17/2015 0956   CALCIUM 9.4 11/17/2015 0956   PROT 6.6 11/17/2015 0956   ALBUMIN 3.9 11/17/2015 0956   AST 20 11/17/2015 0956   ALT 19 11/17/2015 0956   ALKPHOS 52 11/17/2015 0956   BILITOT 0.6 11/17/2015 0956        Assessment & Plan:   70 year old female with a past medical history of GERD, history of H. pylori status post treatment who is seen in follow-up on advice from Dr. Nicki Reaper to discuss reflux.   1. GERD -- Patient with some breakthrough GERD symptoms but no alarm symptom. Primarily regurgitation. She takes omeprazole 40 mg  daily but is not currently using ranitidine. Ranitidine was previously prescribed. Symptoms did not change based on time of day. No history of Barrett's with last EGD roughly 5 yrs ago.  After discussion, we made the decision to change omeprazole to pantoprazole 40 mg daily. This is recommended 30 minutes before breakfast each day. Should she have breakthrough reflux symptoms in the evening or at night she can use zantac or Gaviscon per box instructions.  She is asked to call in 2-3  months with update on symptoms.  If not better or persistent symptoms, EGD would be warranted.  She is happy with this plan  2. Hx of H. Pylori -- bx biopsy in 2012.  Treated with Pylera.  H. Pylori stool Ag recommended to confirm eradication of infection.    25 minutes spent with the patient today. Greater than 50% was spent in counseling and coordination of care with the patient

## 2015-12-28 NOTE — Patient Instructions (Addendum)
Please discontinue omeprazole.  We have sent the following medications to your pharmacy for you to pick up at your convenience: Pantoprazole 40 mg daily.  You may use zantac or gaviscon as needed for breakthrough reflux.   Your physician has requested that you go to the basement for the following lab work before leaving today: H Pylori stool antigen  Please call our office in 2 months to let us know if your symptoms are completely better. If not, we may consider endoscopy.

## 2015-12-30 ENCOUNTER — Other Ambulatory Visit: Payer: Medicare Other

## 2015-12-30 DIAGNOSIS — K219 Gastro-esophageal reflux disease without esophagitis: Secondary | ICD-10-CM

## 2015-12-30 DIAGNOSIS — Z8619 Personal history of other infectious and parasitic diseases: Secondary | ICD-10-CM

## 2015-12-31 LAB — HELICOBACTER PYLORI  SPECIAL ANTIGEN: H. PYLORI Antigen: NOT DETECTED

## 2016-03-23 ENCOUNTER — Other Ambulatory Visit: Payer: Self-pay | Admitting: Internal Medicine

## 2016-04-18 ENCOUNTER — Other Ambulatory Visit: Payer: Self-pay | Admitting: Internal Medicine

## 2016-04-21 ENCOUNTER — Ambulatory Visit (INDEPENDENT_AMBULATORY_CARE_PROVIDER_SITE_OTHER): Payer: Medicare Other | Admitting: Internal Medicine

## 2016-04-21 VITALS — BP 100/60 | HR 64 | Temp 97.9°F | Ht 63.0 in | Wt 135.6 lb

## 2016-04-21 DIAGNOSIS — Z8673 Personal history of transient ischemic attack (TIA), and cerebral infarction without residual deficits: Secondary | ICD-10-CM | POA: Diagnosis not present

## 2016-04-21 DIAGNOSIS — Z0000 Encounter for general adult medical examination without abnormal findings: Secondary | ICD-10-CM

## 2016-04-21 DIAGNOSIS — E7800 Pure hypercholesterolemia, unspecified: Secondary | ICD-10-CM

## 2016-04-21 DIAGNOSIS — K219 Gastro-esophageal reflux disease without esophagitis: Secondary | ICD-10-CM | POA: Diagnosis not present

## 2016-04-21 MED ORDER — PANTOPRAZOLE SODIUM 40 MG PO TBEC: 40.0000 mg | DELAYED_RELEASE_TABLET | Freq: Every day | ORAL | Status: DC

## 2016-04-21 NOTE — Progress Notes (Signed)
Pre visit review using our clinic review tool, if applicable. No additional management support is needed unless otherwise documented below in the visit note. 

## 2016-04-21 NOTE — Progress Notes (Signed)
Patient ID: Joan Ritter, female   DOB: 11-25-1945, 70 y.o.   MRN: [id number]   Subjective:    Patient ID: Joan Ritter, female    DOB: [birth year], 70 y.o.   MRN: [id number]  HPI  Patient here for her physical exam.   She reports she is doing well.  Feels good.  Stays active.  No chest pain.  No sob.  She is taking protonix and zantac.  States has only noticed some acid reflux on one occasion since seeing Dr Hilarie Fredrickson.  No dysphagia.  No abdominal pain or cramping.  Bowels stable.  Has noticed left arm cyst and also noticed increased palpable areas (2) on her left forearm.  No pain.     Past Medical History  Diagnosis Date  . TIA (transient ischemic attack) 2001  . Hypercholesterolemia   . GERD (gastroesophageal reflux disease)   . Allergic rhinitis     seasonal  . History of Helicobacter pylori infection 2012  . Osteopenia   . Elevated TSH   . Esophagitis    Past Surgical History  Procedure Laterality Date  . Hemorrhoid surgery  1980s  . Colonoscopy  2005    normal   . Esophagogastroduodenoscopy  2012    normal   . Breast biopsy Right 1970s and 2000    x2- neg   Family History  Problem Relation Age of Onset  . Lymphoma Father   . CVA Mother     h/o cerebral hemorrhage  . Brain cancer Brother   . Cardiomyopathy Sister     enlarged heart, died age 69 - sudden death  . Breast cancer Maternal Aunt     x2  . Colon cancer Neg Hx    Social History   Social History  . Marital Status: Married    Spouse Name: N/A  . Number of Children: 2  . Years of Education: N/A   Social History Main Topics  . Smoking status: Never Smoker   . Smokeless tobacco: Never Used  . Alcohol Use: No  . Drug Use: No  . Sexual Activity: Not Asked   Other Topics Concern  . None   Social History Narrative    Outpatient Encounter Prescriptions as of 04/21/2016  Medication Sig  . acyclovir (ZOVIRAX) 800 MG tablet Take 800 mg by mouth as needed.  Marland Kitchen aspirin 81 MG tablet Take 81 mg by mouth  daily.  . Calcium Carbonate-Vit D-Min 600-400 MG-UNIT TABS Take 1 tablet by mouth 2 (two) times daily.  . cetirizine (ZYRTEC) 10 MG tablet Take 10 mg by mouth daily as needed.   . clopidogrel (PLAVIX) 75 MG tablet Take 1 tablet (75 mg total) by mouth daily.  . fluticasone (FLONASE) 50 MCG/ACT nasal spray USE 2 SPRAYS EACH NOSTRIL EVERY DAY (Patient taking differently: USE 2 SPRAYS EACH NOSTRIL DAILY AS NEEDED)  . Multiple Vitamin (MULTIVITAMIN) tablet Take 1 tablet by mouth daily.  . pantoprazole (PROTONIX) 40 MG tablet Take 1 tablet (40 mg total) by mouth daily.  . ranitidine (ZANTAC) 150 MG tablet Take 150 mg by mouth at bedtime.  . simvastatin (ZOCOR) 10 MG tablet TAKE 1 TABLET BY MOUTH AT BEDTIME.  . [DISCONTINUED] pantoprazole (PROTONIX) 40 MG tablet Take 1 tablet (40 mg total) by mouth daily.   No facility-administered encounter medications on file as of 04/21/2016.    Review of Systems  Constitutional: Negative for appetite change and unexpected weight change.  HENT: Negative for congestion and sinus pressure.   Eyes: Negative  for pain and visual disturbance.  Respiratory: Negative for cough, chest tightness and shortness of breath.   Cardiovascular: Negative for chest pain, palpitations and leg swelling.  Gastrointestinal: Negative for nausea, vomiting, abdominal pain and diarrhea.  Genitourinary: Negative for dysuria and difficulty urinating.  Musculoskeletal: Negative for back pain and joint swelling.  Skin: Negative for color change and rash.       Palpable cyst - left forearm.  Two other small palpable areas - question of lipoma vs cyst.   Neurological: Negative for dizziness, light-headedness and headaches.  Hematological: Negative for adenopathy. Does not bruise/bleed easily.  Psychiatric/Behavioral: Negative for dysphoric mood and agitation.       Objective:    Physical Exam  Constitutional: She is oriented to person, place, and time. She appears well-developed and  well-nourished. No distress.  HENT:  Nose: Nose normal.  Mouth/Throat: Oropharynx is clear and moist.  Eyes: Right eye exhibits no discharge. Left eye exhibits no discharge. No scleral icterus.  Neck: Neck supple. No thyromegaly present.  Cardiovascular: Normal rate and regular rhythm.   Pulmonary/Chest: Breath sounds normal. No accessory muscle usage. No tachypnea. No respiratory distress. She has no decreased breath sounds. She has no wheezes. She has no rhonchi. Right breast exhibits no inverted nipple, no mass, no nipple discharge and no tenderness (no axillary adenopathy). Left breast exhibits no inverted nipple, no mass, no nipple discharge and no tenderness (no axilarry adenopathy).  Abdominal: Soft. Bowel sounds are normal. There is no tenderness.  Musculoskeletal: She exhibits no edema or tenderness.  Lymphadenopathy:    She has no cervical adenopathy.  Neurological: She is alert and oriented to person, place, and time.  Skin: Skin is warm. No rash noted. No erythema.  Palpable cyst - left forearm.  Two other palpable nodules (cyst vs lipoma, etc).    Psychiatric: She has a normal mood and affect. Her behavior is normal.    BP 100/60 mmHg  Pulse 64  Temp(Src) 97.9 F (36.6 C)  Ht 5\' 3"  (1.6 m)  Wt 135 lb 9.6 oz (61.508 kg)  BMI 24.03 kg/m2  SpO2 97% Wt Readings from Last 3 Encounters:  04/21/16 135 lb 9.6 oz (61.508 kg)  12/28/15 138 lb (62.596 kg)  11/02/15 135 lb 2 oz (61.292 kg)     Lab Results  Component Value Date   WBC 5.9 04/20/2015   HGB 13.8 04/20/2015   HCT 40.2 04/20/2015   PLT 187.0 04/20/2015   GLUCOSE 99 11/17/2015   CHOL 191 11/17/2015   TRIG 95.0 11/17/2015   HDL 63.20 11/17/2015   LDLCALC 109* 11/17/2015   ALT 19 11/17/2015   AST 20 11/17/2015   NA 141 11/17/2015   K 4.5 11/17/2015   CL 105 11/17/2015   CREATININE 0.97 11/17/2015   BUN 16 11/17/2015   CO2 30 11/17/2015   TSH 3.12 11/17/2015    Mm Screening Breast Tomo  Bilateral  11/12/2015  CLINICAL DATA:  Screening. EXAM: DIGITAL SCREENING BILATERAL MAMMOGRAM WITH 3D TOMO WITH CAD COMPARISON:  Previous exam(s). ACR Breast Density Category c: The breast tissue is heterogeneously dense, which may obscure small masses. FINDINGS: There are no findings suspicious for malignancy. Images were processed with CAD. IMPRESSION: No mammographic evidence of malignancy. A result letter of this screening mammogram will be mailed directly to the patient. RECOMMENDATION: Screening mammogram in one year. (Code:SM-B-01Y) BI-RADS CATEGORY  1: Negative. Electronically Signed   By: Lajean Manes M.D.   On: 11/12/2015 14:25  Assessment & Plan:   Problem List Items Addressed This Visit    GERD (gastroesophageal reflux disease)    Just saw GI.  On protonix and zantac.  Symptoms controlled.  Follow.  Take protonix in am.       Relevant Medications   pantoprazole (PROTONIX) 40 MG tablet   Health care maintenance    Physical today 04/21/16.  mammogram 11/12/15 - Birads I.  Colonoscopy 08/10/14 - normal.        History of TIA (transient ischemic attack)    Maintained on aspirin and plavix and doing well.  Follow.       Relevant Orders   CBC with Differential/Platelet   Hypercholesterolemia    On simvastatin.  Low cholesterol diet and exercise.  Follow lipid panel and liver function tests.        Relevant Orders   Lipid panel   Hepatic function panel   Basic metabolic panel    Other Visit Diagnoses    Routine general medical examination at a health care facility    -  Primary        Einar Pheasant, MD

## 2016-04-21 NOTE — Assessment & Plan Note (Signed)
Physical today 04/21/16.  mammogram 11/12/15 - Birads I.  Colonoscopy 08/10/14 - normal.

## 2016-04-22 ENCOUNTER — Encounter: Payer: Self-pay | Admitting: Internal Medicine

## 2016-04-22 NOTE — Assessment & Plan Note (Signed)
Maintained on aspirin and plavix and doing well.  Follow.

## 2016-04-22 NOTE — Assessment & Plan Note (Signed)
On simvastatin.  Low cholesterol diet and exercise.  Follow lipid panel and liver function tests.   

## 2016-04-22 NOTE — Assessment & Plan Note (Signed)
Just saw GI.  On protonix and zantac.  Symptoms controlled.  Follow.  Take protonix in am.

## 2016-04-22 NOTE — Addendum Note (Signed)
Addended by: Alisa Graff on: 04/22/2016 12:20 PM   Modules accepted: Miquel Dunn

## 2016-05-06 ENCOUNTER — Other Ambulatory Visit: Payer: Self-pay | Admitting: Internal Medicine

## 2016-05-12 ENCOUNTER — Other Ambulatory Visit (INDEPENDENT_AMBULATORY_CARE_PROVIDER_SITE_OTHER): Payer: Medicare Other

## 2016-05-12 DIAGNOSIS — E7800 Pure hypercholesterolemia, unspecified: Secondary | ICD-10-CM

## 2016-05-12 DIAGNOSIS — Z8673 Personal history of transient ischemic attack (TIA), and cerebral infarction without residual deficits: Secondary | ICD-10-CM | POA: Diagnosis not present

## 2016-05-12 LAB — HEPATIC FUNCTION PANEL
ALBUMIN: 3.9 g/dL (ref 3.5–5.2)
ALK PHOS: 47 U/L (ref 39–117)
ALT: 18 U/L (ref 0–35)
AST: 20 U/L (ref 0–37)
Bilirubin, Direct: 0.1 mg/dL (ref 0.0–0.3)
TOTAL PROTEIN: 6.5 g/dL (ref 6.0–8.3)
Total Bilirubin: 0.5 mg/dL (ref 0.2–1.2)

## 2016-05-12 LAB — BASIC METABOLIC PANEL
BUN: 17 mg/dL (ref 6–23)
CALCIUM: 9.3 mg/dL (ref 8.4–10.5)
CHLORIDE: 107 meq/L (ref 96–112)
CO2: 31 meq/L (ref 19–32)
CREATININE: 0.92 mg/dL (ref 0.40–1.20)
GFR: 64.06 mL/min (ref >60.00)
Glucose, Bld: 92 mg/dL (ref 70–99)
Potassium: 4.2 mEq/L (ref 3.5–5.1)
SODIUM: 142 meq/L (ref 135–145)

## 2016-05-12 LAB — CBC WITH DIFFERENTIAL/PLATELET
BASOS ABS: 0 10*3/uL (ref 0.0–0.1)
Basophils Relative: 0.6 % (ref 0.0–3.0)
EOS ABS: 0.2 10*3/uL (ref 0.0–0.7)
Eosinophils Relative: 3.5 % (ref 0.0–5.0)
HEMATOCRIT: 42.3 % (ref 36.0–46.0)
HEMOGLOBIN: 14.2 g/dL (ref 12.0–15.0)
LYMPHS PCT: 31.3 % (ref 12.0–46.0)
Lymphs Abs: 1.7 10*3/uL (ref 0.7–4.0)
MCHC: 33.5 g/dL (ref 30.0–36.0)
MCV: 94.3 fl (ref 78.0–100.0)
MONOS PCT: 8.3 % (ref 3.0–12.0)
Monocytes Absolute: 0.5 10*3/uL (ref 0.1–1.0)
Neutro Abs: 3.1 10*3/uL (ref 1.4–7.7)
Neutrophils Relative %: 56.3 % (ref 43.0–77.0)
Platelets: 230 10*3/uL (ref 150.0–400.0)
RBC: 4.49 Mil/uL (ref 3.87–5.11)
RDW: 13.3 % (ref 11.5–15.5)
WBC: 5.5 10*3/uL (ref 4.0–10.5)

## 2016-05-12 LAB — LIPID PANEL
CHOL/HDL RATIO: 3
Cholesterol: 209 mg/dL — ABNORMAL HIGH (ref 0–200)
HDL: 59.9 mg/dL (ref >39.00)
LDL Cholesterol: 134 mg/dL — ABNORMAL HIGH (ref 0–99)
NONHDL: 149.48
TRIGLYCERIDES: 75 mg/dL (ref 0.0–149.0)
VLDL: 15 mg/dL (ref 0.0–40.0)

## 2016-05-16 ENCOUNTER — Encounter: Payer: Self-pay | Admitting: Internal Medicine

## 2016-08-20 ENCOUNTER — Other Ambulatory Visit: Payer: Self-pay | Admitting: Internal Medicine

## 2016-09-12 ENCOUNTER — Other Ambulatory Visit: Payer: Self-pay | Admitting: Internal Medicine

## 2016-10-04 ENCOUNTER — Other Ambulatory Visit: Payer: Self-pay

## 2016-10-04 MED ORDER — PANTOPRAZOLE SODIUM 40 MG PO TBEC: 40.0000 mg | DELAYED_RELEASE_TABLET | Freq: Every day | ORAL | 1 refills | Status: DC

## 2016-10-08 ENCOUNTER — Ambulatory Visit
Admission: EM | Admit: 2016-10-08 | Discharge: 2016-10-08 | Disposition: A | Discharge status: Home / Self Care | Payer: Medicare Other | Attending: Family Medicine | Admitting: Family Medicine

## 2016-10-08 DIAGNOSIS — M5432 Sciatica, left side: Secondary | ICD-10-CM | POA: Diagnosis not present

## 2016-10-08 MED ORDER — KETOROLAC TROMETHAMINE 60 MG/2ML IM SOLN
30.0000 mg | Freq: Once | INTRAMUSCULAR | Status: AC
  Administered 2016-10-08: 30 mg via INTRAMUSCULAR

## 2016-10-08 NOTE — Discharge Instructions (Signed)
You were given a shot of Toradol today to help with pain and inflammation. Recommend rest for next 2 to 3 days. May take Aleve 2 tablets twice a day as needed for pain. Recommend apply warm moist compresses to area for comfort. Follow-up with your primary care provider in 3 days if not improving.

## 2016-10-08 NOTE — ED Notes (Signed)
Patient waited 15 minutes and no reactions were noted. Injection site is unremarkable. 

## 2016-10-08 NOTE — ED Provider Notes (Signed)
CSN: [id number]     Arrival date & time 10/08/16  1218 History   First MD Initiated Contact with Patient 10/08/16 1416     Chief Complaint  Patient presents with  . Hip Pain   (Consider location/radiation/quality/duration/timing/severity/associated sxs/prior Treatment) 70 year old female presents with left sided lower back/buttock pain that travels down her leg to lower calf. Has had occasional pain off and on for the past month but has become more constant and severe in the past 2 days. Denies any fever, numbness, changes in urinary or bowel patterns. Has taken Aleve with minimal relief. Is active and exercises almost daily which at first helped the pain but now seems to make it worse.    The history is provided by the patient.    Past Medical History:  Diagnosis Date  . Allergic rhinitis    seasonal  . Elevated TSH   . Esophagitis   . GERD (gastroesophageal reflux disease)   . History of Helicobacter pylori infection 2012  . Hypercholesterolemia   . Osteopenia   . TIA (transient ischemic attack) 2001   Past Surgical History:  Procedure Laterality Date  . BREAST BIOPSY Right 1970s and 2000   x2- neg  . COLONOSCOPY  2005   normal   . ESOPHAGOGASTRODUODENOSCOPY  2012   normal   . HEMORRHOID SURGERY  1980s   Family History  Problem Relation Age of Onset  . Lymphoma Father   . CVA Mother     h/o cerebral hemorrhage  . Brain cancer Brother   . Cardiomyopathy Sister     enlarged heart, died age 42 - sudden death  . Breast cancer Maternal Aunt     x2  . Colon cancer Neg Hx    Social History  Substance Use Topics  . Smoking status: Never Smoker  . Smokeless tobacco: Never Used  . Alcohol use No   OB History    No data available     Review of Systems  Constitutional: Negative for appetite change, chills, fatigue, fever and unexpected weight change.  Respiratory: Negative for cough and shortness of breath.   Cardiovascular: Negative for chest pain and leg  swelling.  Gastrointestinal: Negative for abdominal pain, diarrhea, nausea and vomiting.  Genitourinary: Negative for difficulty urinating, dysuria and flank pain.  Musculoskeletal: Positive for back pain and myalgias. Negative for neck pain and neck stiffness.  Skin: Negative for rash.  Neurological: Negative for dizziness, tremors, weakness, numbness and headaches.  Hematological: Negative for adenopathy.    Allergies  No known drug allergy  Home Medications   Prior to Admission medications   Medication Sig Start Date End Date Taking? Authorizing Provider  acyclovir (ZOVIRAX) 800 MG tablet Take 800 mg by mouth as needed.   Yes Historical Provider, MD  aspirin 81 MG tablet Take 81 mg by mouth daily.   Yes Historical Provider, MD  Calcium Carbonate-Vit D-Min 600-400 MG-UNIT TABS Take 1 tablet by mouth 2 (two) times daily.   Yes Historical Provider, MD  cetirizine (ZYRTEC) 10 MG tablet Take 10 mg by mouth daily as needed.    Yes Historical Provider, MD  clopidogrel (PLAVIX) 75 MG tablet TAKE 1 TABLET BY MOUTH DAILY 05/08/16  Yes Einar Pheasant, MD  fluticasone (FLONASE) 50 MCG/ACT nasal spray USE 2 SPRAYS EACH NOSTRIL EVERY DAY Patient taking differently: USE 2 SPRAYS EACH NOSTRIL DAILY AS NEEDED 11/01/12  Yes Einar Pheasant, MD  Multiple Vitamin (MULTIVITAMIN) tablet Take 1 tablet by mouth daily.   Yes Historical  Provider, MD  pantoprazole (PROTONIX) 40 MG tablet Take 1 tablet (40 mg total) by mouth daily. 10/04/16  Yes Einar Pheasant, MD  ranitidine (ZANTAC) 150 MG tablet Take 150 mg by mouth at bedtime.   Yes Historical Provider, MD  simvastatin (ZOCOR) 10 MG tablet TAKE 1 TABLET BY MOUTH AT BEDTIME. 09/12/16  Yes Einar Pheasant, MD   Meds Ordered and Administered this Visit   Medications  ketorolac (TORADOL) injection 30 mg (30 mg Intramuscular Given 10/08/16 1443)    BP (!) 126/57 (BP Location: Left Arm)   Pulse 84   Temp 98.6 F (37 C) (Oral)   Resp 18   Ht 5\' 3"  (1.6 m)    Wt 135 lb (61.2 kg)   SpO2 98%   BMI 23.91 kg/m  No data found.   Physical Exam  Constitutional: She is oriented to person, place, and time. She appears well-developed and well-nourished. No distress.  Cardiovascular: Normal rate, regular rhythm and normal heart sounds.   Pulmonary/Chest: Effort normal and breath sounds normal. No respiratory distress.  Musculoskeletal: Normal range of motion. She exhibits no edema or tenderness.       Lumbar back: She exhibits pain. She exhibits normal range of motion, no tenderness, no swelling, no edema and no spasm.       Left upper leg: She exhibits no tenderness, no swelling, no edema and no deformity.  Has full range of motion of lower back, hip and leg. Pain mostly with SLR- no pain with flexion. No tenderness to palpation. Pain starts mid-gluteal and travels to lower calf. No redness seen. No numbness or neuro deficits noted. Good distal pulses.   Neurological: She is alert and oriented to person, place, and time. She has normal strength. No sensory deficit. Gait normal.  Skin: Skin is warm and dry. Capillary refill takes less than 2 seconds. No rash noted.  Psychiatric: She has a normal mood and affect. Her behavior is normal. Judgment and thought content normal.    Urgent Care Course   Clinical Course     Procedures (including critical care time)  Labs Review Labs Reviewed - No data to display  Imaging Review No results found.   Visual Acuity Review  Right Eye Distance:   Left Eye Distance:   Bilateral Distance:    Right Eye Near:   Left Eye Near:    Bilateral Near:         MDM   1. Sciatica of left side    Discussed with patient uncertain of etiology of pain but may be sciatica. Gave Toradol 30mg  IM today to help with pain and inflammation. Discussed that lumbar x-ray will most likely be unremarkable so will not perform today. Recommend apply warm moist heat to area for comfort. Limit exercise/activities for the next 3  days. May also take Aleve 2 tablets every 12 hours as needed for pain. Recommend follow-up with her primary care provider in 4 to 5 days if not improving.      Katy Apo, NP 10/09/16 1026

## 2016-10-08 NOTE — ED Triage Notes (Signed)
Pt c/o left hip pain, it has been hurting the last few months with pain that comes and goes, however this weekend the pain is constant and isn't going away.

## 2016-10-09 ENCOUNTER — Encounter: Payer: Self-pay | Admitting: Family

## 2016-10-09 ENCOUNTER — Ambulatory Visit (INDEPENDENT_AMBULATORY_CARE_PROVIDER_SITE_OTHER): Payer: Medicare Other | Admitting: Family

## 2016-10-09 VITALS — BP 110/68 | HR 82 | Temp 98.6°F | Ht 63.0 in | Wt 137.0 lb

## 2016-10-09 DIAGNOSIS — M7062 Trochanteric bursitis, left hip: Secondary | ICD-10-CM

## 2016-10-09 MED ORDER — PREDNISONE 10 MG PO TABS
ORAL_TABLET | ORAL | 0 refills | Status: DC
Start: 2016-10-09 — End: 2016-10-31

## 2016-10-09 NOTE — Progress Notes (Signed)
Subjective:    Patient ID: Joan Ritter, female    DOB: Sep 26, 1946, 70 y.o.   MRN: [id number]  CC: Joan Ritter is a 70 y.o. female who presents today for an acute visit.    HPI: CC: left hip pain , 6 weeks, worsening. H/o of sciatica. Has been doing PT exercises at home with minimal relief. Constant pain in left hip and radiating to left thigh. Seen at urgent care yesterday for left hip pain, given toradol IM with only temporary relief. Relief when sleeping with pillow between legs. Pain when sleeps on left side. Pain with walking. No injury, saddle anethesia, numbness, tingling, severe HA, cancer.     HISTORY:  Past Medical History:  Diagnosis Date  . Allergic rhinitis    seasonal  . Elevated TSH   . Esophagitis   . GERD (gastroesophageal reflux disease)   . History of Helicobacter pylori infection 2012  . Hypercholesterolemia   . Osteopenia   . TIA (transient ischemic attack) 2001   Past Surgical History:  Procedure Laterality Date  . BREAST BIOPSY Right 1970s and 2000   x2- neg  . COLONOSCOPY  2005   normal   . ESOPHAGOGASTRODUODENOSCOPY  2012   normal   . HEMORRHOID SURGERY  1980s   Family History  Problem Relation Age of Onset  . Lymphoma Father   . CVA Mother     h/o cerebral hemorrhage  . Brain cancer Brother   . Cardiomyopathy Sister     enlarged heart, died age 85 - sudden death  . Breast cancer Maternal Aunt     x2  . Colon cancer Neg Hx     Allergies: No known drug allergy Current Outpatient Prescriptions on File Prior to Visit  Medication Sig Dispense Refill  . acyclovir (ZOVIRAX) 800 MG tablet Take 800 mg by mouth as needed.    Marland Kitchen aspirin 81 MG tablet Take 81 mg by mouth daily.    . Calcium Carbonate-Vit D-Min 600-400 MG-UNIT TABS Take 1 tablet by mouth 2 (two) times daily.    . cetirizine (ZYRTEC) 10 MG tablet Take 10 mg by mouth daily as needed.     . clopidogrel (PLAVIX) 75 MG tablet TAKE 1 TABLET BY MOUTH DAILY 90 tablet 2  . fluticasone  (FLONASE) 50 MCG/ACT nasal spray USE 2 SPRAYS EACH NOSTRIL EVERY DAY (Patient taking differently: USE 2 SPRAYS EACH NOSTRIL DAILY AS NEEDED) 16 g 5  . Multiple Vitamin (MULTIVITAMIN) tablet Take 1 tablet by mouth daily.    . pantoprazole (PROTONIX) 40 MG tablet Take 1 tablet (40 mg total) by mouth daily. 90 tablet 1  . ranitidine (ZANTAC) 150 MG tablet Take 150 mg by mouth at bedtime.    . simvastatin (ZOCOR) 10 MG tablet TAKE 1 TABLET BY MOUTH AT BEDTIME. 90 tablet 1   No current facility-administered medications on file prior to visit.     Social History  Substance Use Topics  . Smoking status: Never Smoker  . Smokeless tobacco: Never Used  . Alcohol use No    Review of Systems  Constitutional: Negative for chills and fever.  Respiratory: Negative for cough.   Cardiovascular: Negative for chest pain and palpitations.  Gastrointestinal: Negative for nausea and vomiting.  Musculoskeletal: Positive for back pain.  Neurological: Negative for headaches.      Objective:    BP 110/68   Pulse 82   Temp 98.6 F (37 C) (Oral)   Ht 5\' 3"  (1.6 m)  Wt 137 lb (62.1 kg)   SpO2 98%   BMI 24.27 kg/m    Physical Exam  Constitutional: She appears well-developed and well-nourished.  Eyes: Conjunctivae are normal.  Cardiovascular: Normal rate, regular rhythm, normal heart sounds and normal pulses.   Pulmonary/Chest: Effort normal and breath sounds normal. She has no wheezes. She has no rhonchi. She has no rales.  Musculoskeletal:       Lumbar back: She exhibits normal range of motion, no tenderness, no bony tenderness, no swelling, no edema, no pain and no spasm.  Full range of motion with flexion, tension, lateral side bends. No bony tenderness. No pain, numbness, tingling elicited with single leg raise bilaterally.  Point tenderness of the left trochanter, SI joint.  Neurological: She is alert. She has normal strength. No sensory deficit.  Reflex Scores:      Patellar reflexes are  2+ on the right side and 2+ on the left side. Sensation and strength intact bilateral lower extremities.  Skin: Skin is warm and dry.  Psychiatric: She has a normal mood and affect. Her speech is normal and behavior is normal. Thought content normal.  Vitals reviewed.      Assessment & Plan:   1. Trochanteric bursitis of left hip Working diagnosis trochanteric bursitis, also suspect SI joint dysfunction. No anterior hip pain suggestive of hip pathology. Reasonable to trial a prednisone taper. Patient may benefit from Kenalog injection and based duration of symptoms, patient would like to go ahead and have referral to orthopedics. Return precautions given. - predniSONE (DELTASONE) 10 MG tablet; Take 40 mg by mouth on day 1, then taper 10 mg daily until gone  Dispense: 10 tablet; Refill: 0 - Ambulatory referral to Orthopedic Surgery    I am having [name] start on predniSONE. I am also having her maintain her aspirin, Calcium Carbonate-Vit D-Min, multivitamin, cetirizine, acyclovir, fluticasone, ranitidine, clopidogrel, simvastatin, and pantoprazole.   Meds ordered this encounter  Medications  . predniSONE (DELTASONE) 10 MG tablet    Sig: Take 40 mg by mouth on day 1, then taper 10 mg daily until gone    Dispense:  10 tablet    Refill:  0    Order Specific Question:   Supervising Provider    Answer:   Crecencio Mc [[id number]]    Return precautions given.   Risks, benefits, and alternatives of the medications and treatment plan prescribed today were discussed, and patient expressed understanding.   Education regarding symptom management and diagnosis given to patient on AVS.  Continue to follow with Einar Pheasant, MD for routine health maintenance.   Joan Ritter and I agreed with plan.   [name], FNP

## 2016-10-09 NOTE — Patient Instructions (Signed)
Suspect trochanteric bursitis.  Trial prednisone Referral to ortho   Trochanteric Bursitis Trochanteric bursitis is a condition that causes hip pain. Trochanteric bursitis happens when fluid-filled sacs (bursae) in the hip get irritated. Normally these sacs absorb shock and help strong bands of tissue (tendons) in your hip glide smoothly over each other and over your hip bones. What are the causes? This condition results from increased friction between the hip bones and the tendons that go over them. This condition can happen if you:  Have weak hips.  Use your hip muscles too much (overuse).  Get hit in the hip. What increases the risk? This condition is more likely to develop in:  Women.  Adults who are middle-aged or older.  People with arthritis or a spinal condition.  People with weak buttocks muscles (gluteal muscles).  People who have one leg that is shorter than the other.  People who participate in certain kinds of athletic activities, such as:  Running sports, especially long-distance running.  Contact sports, like football or martial arts.  Sports in which falls may occur, like skiing. What are the signs or symptoms? The main symptom of this condition is pain and tenderness over the point of your hip. The pain may be:  Sharp and intense.  Dull and achy.  Felt on the outside of your thigh. It may increase when you:  Lie on your side.  Walk or run.  Go up on stairs.  Sit.  Stand up after sitting.  Stand for long periods of time. How is this diagnosed? This condition may be diagnosed based on:  Your symptoms.  Your medical history.  A physical exam.  Imaging tests, such as:  X-rays to check your bones.  An MRI or ultrasound to check your tendons and muscles. During your physical exam, your health care provider will check the movement and strength of your hip. He or she may press on the point of your hip to check for pain. How is this  treated? This condition may be treated by:  Resting.  Reducing your activity.  Avoiding activities that cause pain.  Using crutches, a cane, or a walker to decrease the strain on your hip.  Taking medicine to help with swelling.  Having medicine injected into the bursae to help with swelling.  Using ice, heat, and massage therapy for pain relief.  Physical therapy exercises for strength and flexibility.  Surgery (rare). Follow these instructions at home: Activity  Rest.  Avoid activities that cause pain.  Return to your normal activities as told by your health care provider. Ask your health care provider what activities are safe for you. Managing pain, stiffness, and swelling  Take over-the-counter and prescription medicines only as told by your health care provider.  If directed, apply heat to the injured area as told by your health care provider.  Place a towel between your skin and the heat source.  Leave the heat on for 20-30 minutes.  Remove the heat if your skin turns bright red. This is especially important if you are unable to feel pain, heat, or cold. You may have a greater risk of getting burned.  If directed, apply ice to the injured area:  Put ice in a plastic bag.  Place a towel between your skin and the bag.  Leave the ice on for 20 minutes, 2-3 times a day. General instructions  If the affected leg is one that you use for driving, ask your health care provider when it  is safe to drive.  Use crutches, a cane, or a walker as told by your health care provider.  If one of your legs is shorter than the other, get fitted for a shoe insert.  Lose weight if you are overweight. How is this prevented?  Wear supportive footwear that is appropriate for your sport.  If you have hip pain, start any new exercise or sport slowly.  Maintain physical fitness, including:  Strength.  Flexibility. Contact a health care provider if:  Your pain does not  improve with 2-4 weeks. Get help right away if:  You develop severe pain.  You have a fever.  You develop increased redness over your hip.  You have a change in your bowel function or bladder function.  You cannot control the muscles in your feet. This information is not intended to replace advice given to you by your health care provider. Make sure you discuss any questions you have with your health care provider. Document Released: 11/16/2004 Document Revised: 06/14/2016 Document Reviewed: 09/24/2015 Elsevier Interactive Patient Education  2017 [employer].

## 2016-10-09 NOTE — Progress Notes (Signed)
Pre visit review using our clinic review tool, if applicable. No additional management support is needed unless otherwise documented below in the visit note. 

## 2016-10-31 ENCOUNTER — Ambulatory Visit (INDEPENDENT_AMBULATORY_CARE_PROVIDER_SITE_OTHER): Payer: Medicare Other | Admitting: Internal Medicine

## 2016-10-31 ENCOUNTER — Encounter: Payer: Self-pay | Admitting: Internal Medicine

## 2016-10-31 VITALS — BP 114/60 | HR 61 | Temp 98.3°F | Resp 14 | Wt 133.0 lb

## 2016-10-31 DIAGNOSIS — Z8673 Personal history of transient ischemic attack (TIA), and cerebral infarction without residual deficits: Secondary | ICD-10-CM | POA: Diagnosis not present

## 2016-10-31 DIAGNOSIS — E7800 Pure hypercholesterolemia, unspecified: Secondary | ICD-10-CM | POA: Diagnosis not present

## 2016-10-31 DIAGNOSIS — Z1239 Encounter for other screening for malignant neoplasm of breast: Secondary | ICD-10-CM

## 2016-10-31 DIAGNOSIS — K219 Gastro-esophageal reflux disease without esophagitis: Secondary | ICD-10-CM | POA: Diagnosis not present

## 2016-10-31 DIAGNOSIS — M25552 Pain in left hip: Secondary | ICD-10-CM

## 2016-10-31 DIAGNOSIS — Z1231 Encounter for screening mammogram for malignant neoplasm of breast: Secondary | ICD-10-CM

## 2016-10-31 LAB — HEPATIC FUNCTION PANEL
ALBUMIN: 3.8 g/dL (ref 3.5–5.2)
ALT: 20 U/L (ref 0–35)
AST: 16 U/L (ref 0–37)
Alkaline Phosphatase: 54 U/L (ref 39–117)
BILIRUBIN TOTAL: 0.6 mg/dL (ref 0.2–1.2)
Bilirubin, Direct: 0.1 mg/dL (ref 0.0–0.3)
Total Protein: 6.2 g/dL (ref 6.0–8.3)

## 2016-10-31 LAB — BASIC METABOLIC PANEL
BUN: 18 mg/dL (ref 6–23)
CALCIUM: 9.4 mg/dL (ref 8.4–10.5)
CHLORIDE: 104 meq/L (ref 96–112)
CO2: 30 meq/L (ref 19–32)
Creatinine, Ser: 0.96 mg/dL (ref 0.40–1.20)
GFR: 60.91 mL/min (ref >60.00)
Glucose, Bld: 77 mg/dL (ref 70–99)
POTASSIUM: 4.4 meq/L (ref 3.5–5.1)
SODIUM: 141 meq/L (ref 135–145)

## 2016-10-31 LAB — LIPID PANEL
CHOL/HDL RATIO: 3
Cholesterol: 199 mg/dL (ref 0–200)
HDL: 72.4 mg/dL (ref >39.00)
LDL CALC: 112 mg/dL — AB (ref 0–99)
NONHDL: 127
Triglycerides: 74 mg/dL (ref 0.0–149.0)
VLDL: 14.8 mg/dL (ref 0.0–40.0)

## 2016-10-31 MED ORDER — PANTOPRAZOLE SODIUM 40 MG PO TBEC: 40.0000 mg | DELAYED_RELEASE_TABLET | Freq: Every day | ORAL | 1 refills | Status: DC

## 2016-10-31 NOTE — Progress Notes (Signed)
Pre visit review using our clinic review tool, if applicable. No additional management support is needed unless otherwise documented below in the visit note. 

## 2016-10-31 NOTE — Assessment & Plan Note (Signed)
Low cholesterol diet and exercise.  On simvastatin.  Follow lipid panel and liver function tests.   

## 2016-10-31 NOTE — Progress Notes (Signed)
Patient ID: Joan Ritter, female   DOB: 1946-03-22, 71 y.o.   MRN: [id number]   Subjective:    Patient ID: Joan Ritter, female    DOB: [birth year], 71 y.o.   MRN: [id number]  HPI  Patient here for a scheduled follow up.  She has been having problems with increased left hip and leg pain.  Initially seen at [facility] and was givien Toradol injection.  F/u here and saw [name].  Was diagnosed with trochanteric bursitis of left hip.  Was given prednisone.  Persistent pain.  Evaluated by [facility] - hip injected.  Taking methocarbamol.  Is doing better.  Stays active.  Was limited with her exercise with her hip.  No chest pain.  No sob.  No acid reflux.  No abdominal pain or cramping.  Bowels stable.     Past Medical History:  Diagnosis Date  . Allergic rhinitis    seasonal  . Elevated TSH   . Esophagitis   . GERD (gastroesophageal reflux disease)   . History of Helicobacter pylori infection 2012  . Hypercholesterolemia   . Osteopenia   . TIA (transient ischemic attack) 2001   Past Surgical History:  Procedure Laterality Date  . BREAST BIOPSY Right 1970s and 2000   x2- neg  . COLONOSCOPY  2005   normal   . ESOPHAGOGASTRODUODENOSCOPY  2012   normal   . HEMORRHOID SURGERY  1980s   Family History  Problem Relation Age of Onset  . Lymphoma Father   . CVA Mother     h/o cerebral hemorrhage  . Brain cancer Brother   . Cardiomyopathy Sister     enlarged heart, died age 23 - sudden death  . Breast cancer Maternal Aunt     x2  . Colon cancer Neg Hx    Social History   Social History  . Marital status: Married    Spouse name: N/A  . Number of children: 2  . Years of education: N/A   Social History Main Topics  . Smoking status: Never Smoker  . Smokeless tobacco: Never Used  . Alcohol use No  . Drug use: No  . Sexual activity: Not Asked   Other Topics Concern  . None   Social History Narrative  . None    Outpatient Encounter Prescriptions as of 10/31/2016   Medication Sig  . acyclovir (ZOVIRAX) 800 MG tablet Take 800 mg by mouth as needed.  Marland Kitchen aspirin 81 MG tablet Take 81 mg by mouth daily.  . Calcium Carbonate-Vit D-Min 600-400 MG-UNIT TABS Take 1 tablet by mouth 2 (two) times daily.  . cetirizine (ZYRTEC) 10 MG tablet Take 10 mg by mouth daily as needed.   . clopidogrel (PLAVIX) 75 MG tablet TAKE 1 TABLET BY MOUTH DAILY  . fluticasone (FLONASE) 50 MCG/ACT nasal spray USE 2 SPRAYS EACH NOSTRIL EVERY DAY (Patient taking differently: USE 2 SPRAYS EACH NOSTRIL DAILY AS NEEDED)  . Multiple Vitamin (MULTIVITAMIN) tablet Take 1 tablet by mouth daily.  . pantoprazole (PROTONIX) 40 MG tablet Take 1 tablet (40 mg total) by mouth daily.  . ranitidine (ZANTAC) 150 MG tablet Take 150 mg by mouth at bedtime.  . simvastatin (ZOCOR) 10 MG tablet TAKE 1 TABLET BY MOUTH AT BEDTIME.  . [DISCONTINUED] pantoprazole (PROTONIX) 40 MG tablet Take 1 tablet (40 mg total) by mouth daily.  . [DISCONTINUED] predniSONE (DELTASONE) 10 MG tablet Take 40 mg by mouth on day 1, then taper 10 mg daily until gone  No facility-administered encounter medications on file as of 10/31/2016.     Review of Systems  Constitutional: Negative for appetite change and unexpected weight change.  HENT: Negative for congestion and sinus pressure.   Respiratory: Negative for cough, chest tightness and shortness of breath.   Cardiovascular: Negative for chest pain, palpitations and leg swelling.  Gastrointestinal: Negative for abdominal pain, diarrhea, nausea and vomiting.  Genitourinary: Negative for difficulty urinating and dysuria.  Musculoskeletal: Negative for joint swelling.       Hip pain better.    Skin: Negative for color change and rash.  Neurological: Negative for dizziness, light-headedness and headaches.  Psychiatric/Behavioral: Negative for agitation and dysphoric mood.       Objective:    Physical Exam  Constitutional: She appears well-developed and well-nourished. No  distress.  HENT:  Nose: Nose normal.  Mouth/Throat: Oropharynx is clear and moist.  Neck: Neck supple. No thyromegaly present.  Cardiovascular: Normal rate and regular rhythm.   Pulmonary/Chest: Breath sounds normal. No respiratory distress. She has no wheezes.  Abdominal: Soft. Bowel sounds are normal. There is no tenderness.  Musculoskeletal: She exhibits no edema or tenderness.  Lymphadenopathy:    She has no cervical adenopathy.  Skin: No rash noted. No erythema.  Psychiatric: She has a normal mood and affect. Her behavior is normal.    BP 114/60   Pulse 61   Temp 98.3 F (36.8 C) (Oral)   Resp 14   Wt 133 lb (60.3 kg)   SpO2 98%   BMI 23.56 kg/m  Wt Readings from Last 3 Encounters:  10/31/16 133 lb (60.3 kg)  10/09/16 137 lb (62.1 kg)  10/08/16 135 lb (61.2 kg)     Lab Results  Component Value Date   WBC 5.5 05/12/2016   HGB 14.2 05/12/2016   HCT 42.3 05/12/2016   PLT 230.0 05/12/2016   GLUCOSE 77 10/31/2016   CHOL 199 10/31/2016   TRIG 74.0 10/31/2016   HDL 72.40 10/31/2016   LDLCALC 112 (H) 10/31/2016   ALT 20 10/31/2016   AST 16 10/31/2016   NA 141 10/31/2016   K 4.4 10/31/2016   CL 104 10/31/2016   CREATININE 0.96 10/31/2016   BUN 18 10/31/2016   CO2 30 10/31/2016   TSH 3.12 11/17/2015       Assessment & Plan:   Problem List Items Addressed This Visit    GERD (gastroesophageal reflux disease)    Controlled on current regimen.  Follow.        Relevant Medications   pantoprazole (PROTONIX) 40 MG tablet   History of TIA (transient ischemic attack)    On plavix and doing well.  Follow.        Hypercholesterolemia    Low cholesterol diet and exercise.  On simvastatin.  Follow lipid panel and liver function tests.        Relevant Orders   Lipid panel (Completed)   Hepatic function panel (Completed)   Basic metabolic panel (Completed)    Other Visit Diagnoses    Screening for breast cancer    -  Primary   Relevant Orders   MM DIGITAL  SCREENING BILATERAL   Pain of left hip joint       s/p injection.  doing better.  follow.         Einar Pheasant, MD

## 2016-11-01 ENCOUNTER — Encounter: Payer: Self-pay | Admitting: Internal Medicine

## 2016-11-05 ENCOUNTER — Encounter: Payer: Self-pay | Admitting: Internal Medicine

## 2016-11-05 NOTE — Assessment & Plan Note (Signed)
On plavix and doing well.  Follow.   

## 2016-11-05 NOTE — Assessment & Plan Note (Signed)
Controlled on current regimen.  Follow.  

## 2016-11-27 ENCOUNTER — Ambulatory Visit
Admission: RE | Admit: 2016-11-27 | Discharge: 2016-11-27 | Disposition: A | Discharge status: Home / Self Care | Payer: Medicare Other | Source: Ambulatory Visit | Attending: Internal Medicine | Admitting: Internal Medicine

## 2016-11-27 DIAGNOSIS — Z1231 Encounter for screening mammogram for malignant neoplasm of breast: Secondary | ICD-10-CM | POA: Diagnosis not present

## 2016-11-27 DIAGNOSIS — Z1239 Encounter for other screening for malignant neoplasm of breast: Secondary | ICD-10-CM

## 2017-01-02 ENCOUNTER — Telehealth: Payer: Self-pay | Admitting: Internal Medicine

## 2017-01-02 NOTE — Telephone Encounter (Signed)
Patient was very informed of the PAD screening information and agreed to first available appointment on 01/29/17.

## 2017-01-29 ENCOUNTER — Encounter: Payer: Self-pay | Admitting: Internal Medicine

## 2017-01-29 ENCOUNTER — Ambulatory Visit (INDEPENDENT_AMBULATORY_CARE_PROVIDER_SITE_OTHER): Payer: Medicare Other | Admitting: Internal Medicine

## 2017-01-29 VITALS — BP 118/72 | HR 75 | Temp 98.7°F | Resp 12 | Ht 63.0 in | Wt 135.6 lb

## 2017-01-29 DIAGNOSIS — E7800 Pure hypercholesterolemia, unspecified: Secondary | ICD-10-CM | POA: Diagnosis not present

## 2017-01-29 DIAGNOSIS — Z8673 Personal history of transient ischemic attack (TIA), and cerebral infarction without residual deficits: Secondary | ICD-10-CM

## 2017-01-29 DIAGNOSIS — M79606 Pain in leg, unspecified: Secondary | ICD-10-CM

## 2017-01-29 NOTE — Progress Notes (Signed)
Pre-visit discussion using our clinic review tool. No additional management support is needed unless otherwise documented below in the visit note.  

## 2017-01-29 NOTE — Progress Notes (Signed)
Patient ID: Joan Ritter, female   DOB: [birth year], 71 y.o.   MRN: [id number]   Subjective:    Patient ID: Joan Ritter, female    DOB: 10/10/1946, 70 y.o.   MRN: [id number]  HPI  Patient here to discuss recent screening test.   She participated in a life screening.  Found on PAD screening:  Left foot .81 and right foot .55.  No leg pain.  She stays active.  No chest pain.  No sob.  No abdominal pain.  Stays active.  Exercises.     Past Medical History:  Diagnosis Date  . Allergic rhinitis    seasonal  . Elevated TSH   . Esophagitis   . GERD (gastroesophageal reflux disease)   . History of Helicobacter pylori infection 2012  . Hypercholesterolemia   . Osteopenia   . TIA (transient ischemic attack) 2001   Past Surgical History:  Procedure Laterality Date  . BREAST BIOPSY Right 2000   neg  . BREAST EXCISIONAL BIOPSY Right 1970   NEG  . COLONOSCOPY  2005   normal   . ESOPHAGOGASTRODUODENOSCOPY  2012   normal   . HEMORRHOID SURGERY  1980s   Family History  Problem Relation Age of Onset  . Lymphoma Father   . CVA Mother     h/o cerebral hemorrhage  . Brain cancer Brother   . Cardiomyopathy Sister     enlarged heart, died age 45 - sudden death  . Breast cancer Maternal Aunt     x2  . Colon cancer Neg Hx    Social History   Social History  . Marital status: Married    Spouse name: N/A  . Number of children: 2  . Years of education: N/A   Social History Main Topics  . Smoking status: Never Smoker  . Smokeless tobacco: Never Used  . Alcohol use No  . Drug use: No  . Sexual activity: Not Asked   Other Topics Concern  . None   Social History Narrative  . None    Outpatient Encounter Prescriptions as of 01/29/2017  Medication Sig  . acyclovir (ZOVIRAX) 800 MG tablet Take 800 mg by mouth as needed.  Marland Kitchen aspirin 81 MG tablet Take 81 mg by mouth daily.  . Calcium Carbonate-Vit D-Min 600-400 MG-UNIT TABS Take 1 tablet by mouth 2 (two) times daily.  . cetirizine  (ZYRTEC) 10 MG tablet Take 10 mg by mouth daily as needed.   . clopidogrel (PLAVIX) 75 MG tablet TAKE 1 TABLET BY MOUTH DAILY  . fluticasone (FLONASE) 50 MCG/ACT nasal spray USE 2 SPRAYS EACH NOSTRIL EVERY DAY (Patient taking differently: USE 2 SPRAYS EACH NOSTRIL DAILY AS NEEDED)  . Multiple Vitamin (MULTIVITAMIN) tablet Take 1 tablet by mouth daily.  . pantoprazole (PROTONIX) 40 MG tablet Take 1 tablet (40 mg total) by mouth daily.  . ranitidine (ZANTAC) 150 MG tablet Take 150 mg by mouth at bedtime.  . simvastatin (ZOCOR) 10 MG tablet TAKE 1 TABLET BY MOUTH AT BEDTIME.   No facility-administered encounter medications on file as of 01/29/2017.     Review of Systems  Constitutional: Negative for appetite change and unexpected weight change.  Respiratory: Negative for cough, chest tightness and shortness of breath.   Cardiovascular: Negative for chest pain, palpitations and leg swelling.  Gastrointestinal: Negative for abdominal pain, diarrhea, nausea and vomiting.  Musculoskeletal: Negative for back pain and joint swelling.  Skin: Negative for color change and rash.  Neurological: Negative for dizziness, light-headedness  and headaches.  Psychiatric/Behavioral: Negative for agitation and dysphoric mood.       Objective:    Physical Exam  Constitutional: She appears well-developed and well-nourished. No distress.  HENT:  Nose: Nose normal.  Mouth/Throat: Oropharynx is clear and moist.  Neck: Neck supple. No thyromegaly present.  Cardiovascular: Normal rate and regular rhythm.   DP pulses palpable and equal bilaterally.   Pulmonary/Chest: Breath sounds normal. No respiratory distress. She has no wheezes.  Abdominal: Soft. Bowel sounds are normal. There is no tenderness.  Musculoskeletal: She exhibits no edema or tenderness.  Lymphadenopathy:    She has no cervical adenopathy.  Skin: No rash noted. No erythema.  Psychiatric: She has a normal mood and affect. Her behavior is normal.      BP 118/72 (BP Location: Left Arm, Patient Position: Sitting, Cuff Size: Normal)   Pulse 75   Temp 98.7 F (37.1 C) (Oral)   Resp 12   Ht 5\' 3"  (1.6 m)   Wt 135 lb 9.6 oz (61.5 kg)   SpO2 99%   BMI 24.02 kg/m  Wt Readings from Last 3 Encounters:  01/29/17 135 lb 9.6 oz (61.5 kg)  10/31/16 133 lb (60.3 kg)  10/09/16 137 lb (62.1 kg)     Lab Results  Component Value Date   WBC 5.5 05/12/2016   HGB 14.2 05/12/2016   HCT 42.3 05/12/2016   PLT 230.0 05/12/2016   GLUCOSE 77 10/31/2016   CHOL 199 10/31/2016   TRIG 74.0 10/31/2016   HDL 72.40 10/31/2016   LDLCALC 112 (H) 10/31/2016   ALT 20 10/31/2016   AST 16 10/31/2016   NA 141 10/31/2016   K 4.4 10/31/2016   CL 104 10/31/2016   CREATININE 0.96 10/31/2016   BUN 18 10/31/2016   CO2 30 10/31/2016   TSH 3.12 11/17/2015    Mm Screening Breast Tomo Bilateral  Result Date: 11/27/2016 CLINICAL DATA:  Screening. EXAM: 2D DIGITAL SCREENING BILATERAL MAMMOGRAM WITH CAD AND ADJUNCT TOMO COMPARISON:  Previous exam(s). ACR Breast Density Category c: The breast tissue is heterogeneously dense, which may obscure small masses. FINDINGS: There are no findings suspicious for malignancy. Images were processed with CAD. IMPRESSION: No mammographic evidence of malignancy. A result letter of this screening mammogram will be mailed directly to the patient. RECOMMENDATION: Screening mammogram in one year. (Code:SM-B-01Y) BI-RADS CATEGORY  1: Negative. Electronically Signed   By: Ammie Ferrier M.D.   On: 11/27/2016 13:43       Assessment & Plan:   Problem List Items Addressed This Visit    History of TIA (transient ischemic attack)    On plavix and doing well.  Follow.       Hypercholesterolemia    On simvastatin.  Continue low cholesterol diet and exercise.  Follow lipid panel and liver function tests.         Other Visit Diagnoses    Pain of lower extremity, unspecified laterality    -  Primary   PAD screening - left .81, right  .55.  palpable pulses bilaterally.  will have [facility] perform ABIs.     Relevant Orders   Ambulatory referral to Vascular Surgery       Einar Pheasant, MD

## 2017-02-11 ENCOUNTER — Encounter: Payer: Self-pay | Admitting: Internal Medicine

## 2017-02-11 NOTE — Assessment & Plan Note (Signed)
On plavix and doing well.  Follow.   

## 2017-02-11 NOTE — Assessment & Plan Note (Signed)
On simvastatin.  Continue low cholesterol diet and exercise.  Follow lipid panel and liver function tests.

## 2017-02-22 ENCOUNTER — Ambulatory Visit (INDEPENDENT_AMBULATORY_CARE_PROVIDER_SITE_OTHER): Payer: Medicare Other

## 2017-02-22 VITALS — BP 116/70 | HR 82 | Temp 98.3°F | Resp 12 | Ht 63.0 in | Wt 138.0 lb

## 2017-02-22 DIAGNOSIS — Z0000 Encounter for general adult medical examination without abnormal findings: Secondary | ICD-10-CM | POA: Diagnosis not present

## 2017-02-22 NOTE — Patient Instructions (Addendum)
  Joan Ritter , Thank you for taking time to come for your Medicare Wellness Visit. I appreciate your ongoing commitment to your health goals. Please review the following plan we discussed and let me know if I can assist you in the future.   Follow up with Dr. Nicki Reaper as needed.    Consider Hepatitis C Screening  Consider Pneumovax 23 vaccine  Have a great day!  These are the goals we discussed: Goals    . Healthy Lifestyle          Stay active Low carb foods Stay hydrated       This is a list of the screening recommended for you and due dates:  Health Maintenance  Topic Date Due  .  Hepatitis C: One time screening is recommended by Center for Disease Control  (CDC) for  adults born from [birth year] through 1965.   1946-01-01  . Tetanus Vaccine  11/26/1964  . Pneumonia vaccines (2 of 2 - PPSV23) 05/02/2015  . Flu Shot  05/23/2017  . Mammogram  11/27/2017  . Colon Cancer Screening  08/10/2024  . DEXA scan (bone density measurement)  Completed

## 2017-02-22 NOTE — Progress Notes (Signed)
Subjective:   Joan Ritter is a 71 y.o. female who presents for an Subsequent Medicare Annual Wellness Visit.  Review of Systems    No ROS.  Medicare Wellness Visit.  Cardiac Risk Factors include: advanced age (>23men, >60 women)     Objective:    Today's Vitals   02/22/17 1539  BP: 116/70  Pulse: 82  Resp: 12  Temp: 98.3 F (36.8 C)  TempSrc: Oral  SpO2: 96%  Weight: 138 lb (62.6 kg)  Height: 5\' 3"  (1.6 m)   Body mass index is 24.45 kg/m.   Current Medications (verified) Outpatient Encounter Prescriptions as of 02/22/2017  Medication Sig  . acyclovir (ZOVIRAX) 800 MG tablet Take 800 mg by mouth as needed.  Marland Kitchen aspirin 81 MG tablet Take 81 mg by mouth daily.  . Calcium Carbonate-Vit D-Min 600-400 MG-UNIT TABS Take 1 tablet by mouth 2 (two) times daily.  . cetirizine (ZYRTEC) 10 MG tablet Take 10 mg by mouth daily as needed.   . clopidogrel (PLAVIX) 75 MG tablet TAKE 1 TABLET BY MOUTH DAILY  . fluticasone (FLONASE) 50 MCG/ACT nasal spray USE 2 SPRAYS EACH NOSTRIL EVERY DAY (Patient taking differently: USE 2 SPRAYS EACH NOSTRIL DAILY AS NEEDED)  . gabapentin (NEURONTIN) 100 MG capsule Take 100 mg by mouth 3 (three) times daily.  . Multiple Vitamin (MULTIVITAMIN) tablet Take 1 tablet by mouth daily.  . pantoprazole (PROTONIX) 40 MG tablet Take 1 tablet (40 mg total) by mouth daily.  . ranitidine (ZANTAC) 150 MG tablet Take 150 mg by mouth at bedtime.  . simvastatin (ZOCOR) 10 MG tablet TAKE 1 TABLET BY MOUTH AT BEDTIME.   No facility-administered encounter medications on file as of 02/22/2017.     Allergies (verified) No known drug allergy   History: Past Medical History:  Diagnosis Date  . Allergic rhinitis    seasonal  . Elevated TSH   . Esophagitis   . GERD (gastroesophageal reflux disease)   . History of Helicobacter pylori infection 2012  . Hypercholesterolemia   . Osteopenia   . TIA (transient ischemic attack) 2001   Past Surgical History:  Procedure  Laterality Date  . BREAST BIOPSY Right 2000   neg  . BREAST EXCISIONAL BIOPSY Right 1970   NEG  . COLONOSCOPY  2005   normal   . ESOPHAGOGASTRODUODENOSCOPY  2012   normal   . HEMORRHOID SURGERY  1980s   Family History  Problem Relation Age of Onset  . Lymphoma Father   . CVA Mother     h/o cerebral hemorrhage  . Brain cancer Brother   . Cardiomyopathy Sister     enlarged heart, died age 32 - sudden death  . Breast cancer Maternal Aunt     x2  . Colon cancer Neg Hx    Social History   Occupational History  . Not on file.   Social History Main Topics  . Smoking status: Never Smoker  . Smokeless tobacco: Never Used  . Alcohol use No  . Drug use: No  . Sexual activity: Not on file    Tobacco Counseling Counseling given: Not Answered   Activities of Daily Living In your present state of health, do you have any difficulty performing the following activities: 02/22/2017  Hearing? N  Vision? N  Difficulty concentrating or making decisions? N  Walking or climbing stairs? N  Dressing or bathing? N  Doing errands, shopping? N  Preparing Food and eating ? N  Using the Toilet? N  In the past six months, have you accidently leaked urine? N  Do you have problems with loss of bowel control? N  Managing your Medications? N  Managing your Finances? N  Housekeeping or managing your Housekeeping? N  Some recent data might be hidden    Immunizations and Health Maintenance Immunization History  Administered Date(s) Administered  . Influenza Split 08/18/2014  . Influenza-Unspecified 07/23/2013, 09/01/2015, 07/26/2016  . Pneumococcal Conjugate-13 05/01/2014  . Zoster 10/23/2005   Health Maintenance Due  Topic Date Due  . Hepatitis C Screening  January 25, 1946  . TETANUS/TDAP  11/26/1964  . PNA vac Low Risk Adult (2 of 2 - PPSV23) 05/02/2015    Patient Care Team: Einar Pheasant, MD as PCP - General (Internal Medicine)  Indicate any recent Medical Services you may have  received from other than [facility] providers in the past year (date may be approximate).     Assessment:   This is a routine wellness examination for Joan Ritter. The goal of the wellness visit is to assist the patient how to close the gaps in care and create a preventative care plan for the patient.   Taking calcium VIT D as appropriate/Osteoporosis risk reviewed.  Medications reviewed; taking without issues or barriers.  Safety issues reviewed; smoke detectors in the home. No firearms in the home.  Wears seatbelts when driving or riding with others. Patient does wear sunscreen or protective clothing when in direct sunlight. No violence in the home.  Depression- PHQ 2 &9 complete.  No signs/symptoms or verbal communication regarding little pleasure in doing things, feeling down, depressed or hopeless. No changes in sleeping, energy, eating, concentrating.  No thoughts of self harm or harm towards others.  Time spent on this topic is 8 minutes.   Patient is alert, normal appearance, oriented to person/place/and time. Correctly identified the president of the [place], recall of 3/3 words, and performing simple calculations.  Patient displays appropriate judgement and can read correct time from watch face.  No new identified risk were noted.  No failures at ADL's or IADL's.   BMI- discussed the importance of a healthy diet, water intake and exercise. Educational material provided.   Dental- every six months.  Eye- Visual acuity not assessed per patient preference since they have regular follow up with the ophthalmologist.  Wears corrective lenses.  Sleep patterns- Sleeps 6 hours at night.  Wakes feeling rested.  Pneumovax 23 and TDAP vaccine deferred per patient preference.    Educational material provided.  Hepatitis C Screening discussed, educational material provided.  Patient Concerns: None at this time. Follow up with PCP as needed.  Hearing/Vision screen Hearing Screening Comments:  Patient is able to hear conversational tones without difficulty.  No issues reported.  Vision Screening Comments: Followed by Sondra Barges Wears corrective lenses  Last OV 12/2016 Cataract extraction, bilateral Visual acuity not assessed per patient preference since they have regular follow up with the ophthalmologist  Dietary issues and exercise activities discussed: Current Exercise Habits: Structured exercise class, Type of exercise: calisthenics;stretching;walking, Time (Minutes): 60, Frequency (Times/Week): 3, Weekly Exercise (Minutes/Week): 180, Intensity: Mild  Goals    . Healthy Lifestyle          Stay active Low carb foods Stay hydrated      Depression Screen PHQ 2/9 Scores 02/22/2017 10/09/2016 04/21/2016 04/20/2015 10/09/2014 04/16/2014 03/06/2013  PHQ - 2 Score 0 0 0 0 0 0 0  PHQ- 9 Score 0 - - - - - -    Fall Risk Fall  Risk  02/22/2017 10/09/2016 04/21/2016 04/20/2015 10/09/2014  Falls in the past year? No No No No No    Cognitive Function: MMSE - Mini Mental State Exam 02/22/2017  Orientation to time 5  Orientation to Place 5  Registration 3  Attention/ Calculation 5  Recall 3  Language- name 2 objects 2  Language- repeat 1  Language- follow 3 step command 3  Language- read & follow direction 1  Write a sentence 1  Copy design 1  Total score 30        Screening Tests Health Maintenance  Topic Date Due  . Hepatitis C Screening  12/10/1945  . TETANUS/TDAP  11/26/1964  . PNA vac Low Risk Adult (2 of 2 - PPSV23) 05/02/2015  . INFLUENZA VACCINE  05/23/2017  . MAMMOGRAM  11/27/2017  . COLONOSCOPY  08/10/2024  . DEXA SCAN  Completed      Plan:   End of life planning; Advanced aging; Advanced directives discussed.  No HCPOA/Living Will.  Additional information declined at this time.  I have personally reviewed and noted the following in the patient's chart:   . Medical and social history . Use of alcohol, tobacco or illicit drugs  . Current medications  and supplements . Functional ability and status . Nutritional status . Physical activity . Advanced directives . List of other physicians . Hospitalizations, surgeries, and ER visits in previous 12 months . Vitals . Screenings to include cognitive, depression, and falls . Referrals and appointments  In addition, I have reviewed and discussed with patient certain preventive protocols, quality metrics, and best practice recommendations. A written personalized care plan for preventive services as well as general preventive health recommendations were provided to patient.     Varney Biles, LPN   6/0/1561

## 2017-02-23 ENCOUNTER — Telehealth: Payer: Self-pay | Admitting: Internal Medicine

## 2017-02-23 MED ORDER — ACYCLOVIR 800 MG PO TABS: ORAL_TABLET | ORAL | Status: DC

## 2017-02-23 NOTE — Telephone Encounter (Signed)
-----   Message from Dia Crawford, LPN sent at 12/28/8873  4:02 PM EDT ----- Regarding: Med refill Pt wants a refill on Acyclovir for flare ups in the mouth and sent to her local pharmacy. You are not the prescribing physician.  She believes it was given by her dermatologist.  Are you OK to refill or should I have her contact prescribing physician?  I will follow as directed.  Thanks Denisa

## 2017-02-23 NOTE — Telephone Encounter (Signed)
rx ok'd for acyclovir #30 with no refills.

## 2017-02-23 NOTE — Progress Notes (Signed)
Care was provided under my supervision. I agree with the management as indicated in the note.  Silverio Hagan DO  

## 2017-03-01 ENCOUNTER — Other Ambulatory Visit: Payer: Self-pay | Admitting: Internal Medicine

## 2017-03-01 DIAGNOSIS — M79606 Pain in leg, unspecified: Secondary | ICD-10-CM

## 2017-03-05 ENCOUNTER — Ambulatory Visit (INDEPENDENT_AMBULATORY_CARE_PROVIDER_SITE_OTHER): Payer: Medicare Other

## 2017-03-05 DIAGNOSIS — M79606 Pain in leg, unspecified: Secondary | ICD-10-CM | POA: Diagnosis not present

## 2017-03-14 ENCOUNTER — Encounter: Payer: Self-pay | Admitting: Internal Medicine

## 2017-03-27 ENCOUNTER — Other Ambulatory Visit: Payer: Self-pay | Admitting: Internal Medicine

## 2017-03-28 ENCOUNTER — Other Ambulatory Visit: Payer: Self-pay | Admitting: Internal Medicine

## 2017-05-06 ENCOUNTER — Other Ambulatory Visit: Payer: Self-pay | Admitting: Internal Medicine

## 2017-05-09 ENCOUNTER — Encounter: Payer: Self-pay | Admitting: Internal Medicine

## 2017-05-09 ENCOUNTER — Ambulatory Visit (INDEPENDENT_AMBULATORY_CARE_PROVIDER_SITE_OTHER): Payer: Medicare Other | Admitting: Internal Medicine

## 2017-05-09 ENCOUNTER — Ambulatory Visit (INDEPENDENT_AMBULATORY_CARE_PROVIDER_SITE_OTHER): Payer: Medicare Other

## 2017-05-09 VITALS — BP 110/62 | HR 65 | Temp 98.6°F | Resp 12 | Ht 63.0 in | Wt 135.8 lb

## 2017-05-09 DIAGNOSIS — M549 Dorsalgia, unspecified: Secondary | ICD-10-CM | POA: Insufficient documentation

## 2017-05-09 DIAGNOSIS — R195 Other fecal abnormalities: Secondary | ICD-10-CM

## 2017-05-09 DIAGNOSIS — Z0000 Encounter for general adult medical examination without abnormal findings: Secondary | ICD-10-CM

## 2017-05-09 DIAGNOSIS — M5442 Lumbago with sciatica, left side: Secondary | ICD-10-CM

## 2017-05-09 DIAGNOSIS — M545 Low back pain, unspecified: Secondary | ICD-10-CM | POA: Insufficient documentation

## 2017-05-09 DIAGNOSIS — Z8673 Personal history of transient ischemic attack (TIA), and cerebral infarction without residual deficits: Secondary | ICD-10-CM

## 2017-05-09 DIAGNOSIS — N63 Unspecified lump in unspecified breast: Secondary | ICD-10-CM | POA: Diagnosis not present

## 2017-05-09 DIAGNOSIS — E7800 Pure hypercholesterolemia, unspecified: Secondary | ICD-10-CM

## 2017-05-09 LAB — CBC WITH DIFFERENTIAL/PLATELET
Basophils Absolute: 0.1 10*3/uL (ref 0.0–0.1)
Basophils Relative: 1 % (ref 0.0–3.0)
EOS PCT: 3.7 % (ref 0.0–5.0)
Eosinophils Absolute: 0.2 10*3/uL (ref 0.0–0.7)
HCT: 42.9 % (ref 36.0–46.0)
HEMOGLOBIN: 14.7 g/dL (ref 12.0–15.0)
LYMPHS PCT: 29 % (ref 12.0–46.0)
Lymphs Abs: 1.6 10*3/uL (ref 0.7–4.0)
MCHC: 34.2 g/dL (ref 30.0–36.0)
MCV: 94.6 fl (ref 78.0–100.0)
MONO ABS: 0.5 10*3/uL (ref 0.1–1.0)
MONOS PCT: 9.4 % (ref 3.0–12.0)
Neutro Abs: 3.1 10*3/uL (ref 1.4–7.7)
Neutrophils Relative %: 56.9 % (ref 43.0–77.0)
Platelets: 217 10*3/uL (ref 150.0–400.0)
RBC: 4.53 Mil/uL (ref 3.87–5.11)
RDW: 12.8 % (ref 11.5–15.5)
WBC: 5.5 10*3/uL (ref 4.0–10.5)

## 2017-05-09 LAB — HEPATIC FUNCTION PANEL
ALT: 18 U/L (ref 0–35)
AST: 23 U/L (ref 0–37)
Albumin: 4.1 g/dL (ref 3.5–5.2)
Alkaline Phosphatase: 50 U/L (ref 39–117)
BILIRUBIN TOTAL: 0.6 mg/dL (ref 0.2–1.2)
Bilirubin, Direct: 0.2 mg/dL (ref 0.0–0.3)
Total Protein: 6.6 g/dL (ref 6.0–8.3)

## 2017-05-09 LAB — LIPID PANEL
CHOL/HDL RATIO: 3
CHOLESTEROL: 174 mg/dL (ref 0–200)
HDL: 65.9 mg/dL (ref >39.00)
LDL CALC: 92 mg/dL (ref 0–99)
NonHDL: 108.17
Triglycerides: 79 mg/dL (ref 0.0–149.0)
VLDL: 15.8 mg/dL (ref 0.0–40.0)

## 2017-05-09 LAB — BASIC METABOLIC PANEL
BUN: 15 mg/dL (ref 6–23)
CALCIUM: 9.7 mg/dL (ref 8.4–10.5)
CO2: 30 meq/L (ref 19–32)
Chloride: 105 mEq/L (ref 96–112)
Creatinine, Ser: 0.9 mg/dL (ref 0.40–1.20)
GFR: 65.52 mL/min (ref >60.00)
Glucose, Bld: 81 mg/dL (ref 70–99)
Potassium: 3.9 mEq/L (ref 3.5–5.1)
SODIUM: 142 meq/L (ref 135–145)

## 2017-05-09 LAB — TSH: TSH: 3.01 u[IU]/mL (ref 0.35–4.50)

## 2017-05-09 MED ORDER — CLOPIDOGREL BISULFATE 75 MG PO TABS: 75.0000 mg | ORAL_TABLET | Freq: Every day | ORAL | 2 refills | Status: DC

## 2017-05-09 MED ORDER — PANTOPRAZOLE SODIUM 40 MG PO TBEC: 40.0000 mg | DELAYED_RELEASE_TABLET | Freq: Every day | ORAL | 1 refills | Status: DC

## 2017-05-09 MED ORDER — SIMVASTATIN 10 MG PO TABS: 10.0000 mg | ORAL_TABLET | Freq: Every day | ORAL | 1 refills | Status: DC

## 2017-05-09 NOTE — Assessment & Plan Note (Signed)
Right breast nodule as outlined.  Schedule diagnostic mammogram with ultrasound.

## 2017-05-09 NOTE — Assessment & Plan Note (Signed)
On simvastatin.  Low cholesterol diet and exercise.  Follow lipid panel and liver function tests.   

## 2017-05-09 NOTE — Progress Notes (Signed)
Pre-visit discussion using our clinic review tool. No additional management support is needed unless otherwise documented below in the visit note.  

## 2017-05-09 NOTE — Assessment & Plan Note (Signed)
On plavix and doing well.  

## 2017-05-09 NOTE — Assessment & Plan Note (Signed)
Physical today 05/09/17.  Mammogram 11/27/16 - Birads I.  Colonoscopy 08/10/14 - normal.

## 2017-05-09 NOTE — Assessment & Plan Note (Signed)
Low back pain and pain extending down into left buttock and left leg.  Persistent.  Sometimes limits activity.  She continues to exercise.  Will check xray.  Further w/up pending results.

## 2017-05-09 NOTE — Progress Notes (Signed)
Patient ID: Joan Ritter, female   DOB: Apr 05, 1946, 71 y.o.   MRN: [id number]   Subjective:    Patient ID: Joan Ritter, female    DOB: 01/13/1946, 71 y.o.   MRN: [id number]  HPI  Patient here for her physical exam.  Also has several concerns.  She reports noticing a "knot" on her right breast.  2:00 position.  Has been present for one month.  No pain.  No change.  She also reports persistent left lower back pain and pain radiating down into her left buttock and down her left leg - to ankle.  She occasionally will stop walking just to no aggravate her symptoms.  This has been present since 09/2016.  Discussed further w/up.  She also reports that she was on abx for right lower leg lesion/infection.  Took abx from 03/31/17 -11/10/16.  Infection cleared.  Since on abx, has had increased bowel movements - 5x/day.  Soft stool.  No abdominal pain.  No blood in stool.  Eating and drinking well.  No nausea or vomiting.  No chest pain.  No sob.  No fever.    Past Medical History:  Diagnosis Date  . Allergic rhinitis    seasonal  . Elevated TSH   . Esophagitis   . GERD (gastroesophageal reflux disease)   . History of Helicobacter pylori infection 2012  . Hypercholesterolemia   . Osteopenia   . TIA (transient ischemic attack) 2001   Past Surgical History:  Procedure Laterality Date  . BREAST BIOPSY Right 2000   neg  . BREAST EXCISIONAL BIOPSY Right 1970   NEG  . COLONOSCOPY  2005   normal   . ESOPHAGOGASTRODUODENOSCOPY  2012   normal   . HEMORRHOID SURGERY  1980s   Family History  Problem Relation Age of Onset  . Lymphoma Father   . CVA Mother        h/o cerebral hemorrhage  . Brain cancer Brother   . Cardiomyopathy Sister        enlarged heart, died age 13 - sudden death  . Breast cancer Maternal Aunt        x2  . Colon cancer Neg Hx    Social History   Social History  . Marital status: Married    Spouse name: N/A  . Number of children: 2  . Years of education: N/A   Social  History Main Topics  . Smoking status: Never Smoker  . Smokeless tobacco: Never Used  . Alcohol use No  . Drug use: No  . Sexual activity: Not Asked   Other Topics Concern  . None   Social History Narrative  . None    Outpatient Encounter Prescriptions as of 05/09/2017  Medication Sig  . acyclovir (ZOVIRAX) 800 MG tablet Take as directed.  Marland Kitchen aspirin 81 MG tablet Take 81 mg by mouth daily.  . Calcium Carbonate-Vit D-Min 600-400 MG-UNIT TABS Take 1 tablet by mouth 2 (two) times daily.  . cetirizine (ZYRTEC) 10 MG tablet Take 10 mg by mouth daily as needed.   . clopidogrel (PLAVIX) 75 MG tablet Take 1 tablet (75 mg total) by mouth daily.  . fluticasone (FLONASE) 50 MCG/ACT nasal spray USE 2 SPRAYS EACH NOSTRIL EVERY DAY (Patient taking differently: USE 2 SPRAYS EACH NOSTRIL DAILY AS NEEDED)  . gabapentin (NEURONTIN) 100 MG capsule TAKE 2-3 CAPSULES PER DAY  . Multiple Vitamin (MULTIVITAMIN) tablet Take 1 tablet by mouth daily.  . pantoprazole (PROTONIX) 40 MG tablet Take 1  tablet (40 mg total) by mouth daily.  . ranitidine (ZANTAC) 150 MG tablet Take 150 mg by mouth at bedtime.  . simvastatin (ZOCOR) 10 MG tablet Take 1 tablet (10 mg total) by mouth at bedtime.  . [DISCONTINUED] clopidogrel (PLAVIX) 75 MG tablet TAKE 1 TABLET BY MOUTH DAILY  . [DISCONTINUED] pantoprazole (PROTONIX) 40 MG tablet Take 1 tablet (40 mg total) by mouth daily.  . [DISCONTINUED] simvastatin (ZOCOR) 10 MG tablet TAKE 1 TABLET BY MOUTH AT BEDTIME.   No facility-administered encounter medications on file as of 05/09/2017.     Review of Systems  Constitutional: Negative for appetite change and unexpected weight change.  HENT: Negative for congestion and sinus pressure.   Eyes: Negative for pain and visual disturbance.  Respiratory: Negative for cough, chest tightness and shortness of breath.   Cardiovascular: Negative for chest pain, palpitations and leg swelling.  Gastrointestinal: Negative for abdominal  pain, nausea and vomiting.       Persistent soft stool - 5x/day.    Genitourinary: Negative for difficulty urinating and dysuria.  Musculoskeletal: Negative for joint swelling.       Left lower back and left buttock to left leg pain as outlined.   Skin: Negative for color change and rash.  Neurological: Negative for dizziness, light-headedness and headaches.  Hematological: Negative for adenopathy. Does not bruise/bleed easily.  Psychiatric/Behavioral: Negative for agitation and dysphoric mood.       Objective:    Physical Exam  Constitutional: She appears well-developed and well-nourished. No distress.  HENT:  Nose: Nose normal.  Mouth/Throat: Oropharynx is clear and moist.  Eyes: Conjunctivae are normal. Right eye exhibits no discharge. Left eye exhibits no discharge.  Neck: Neck supple. No thyromegaly present.  Cardiovascular: Normal rate and regular rhythm.   Pulmonary/Chest: Breath sounds normal. No respiratory distress. She has no wheezes.  Breasts:  No nipple discharge or nipple retraction present. Nodule present - 2:00 right breast.  Non tender.  No axillary adenopathy.  No nodules - left breast.    Abdominal: Soft. Bowel sounds are normal. There is no tenderness.  Musculoskeletal: She exhibits no edema or tenderness.  Lymphadenopathy:    She has no cervical adenopathy.  Skin: No rash noted. No erythema.  Psychiatric: She has a normal mood and affect. Her behavior is normal.    BP 110/62 (BP Location: Left Arm, Patient Position: Sitting, Cuff Size: Normal)   Pulse 65   Temp 98.6 F (37 C) (Oral)   Resp 12   Ht 5\' 3"  (1.6 m)   Wt 135 lb 12.8 oz (61.6 kg)   SpO2 (!) 65%   BMI 24.06 kg/m  Wt Readings from Last 3 Encounters:  05/09/17 135 lb 12.8 oz (61.6 kg)  02/22/17 138 lb (62.6 kg)  01/29/17 135 lb 9.6 oz (61.5 kg)     Lab Results  Component Value Date   WBC 5.5 05/09/2017   HGB 14.7 05/09/2017   HCT 42.9 05/09/2017   PLT 217.0 05/09/2017   GLUCOSE 81  05/09/2017   CHOL 174 05/09/2017   TRIG 79.0 05/09/2017   HDL 65.90 05/09/2017   LDLCALC 92 05/09/2017   ALT 18 05/09/2017   AST 23 05/09/2017   NA 142 05/09/2017   K 3.9 05/09/2017   CL 105 05/09/2017   CREATININE 0.90 05/09/2017   BUN 15 05/09/2017   CO2 30 05/09/2017   TSH 3.01 05/09/2017    Mm Screening Breast Tomo Bilateral  Result Date: 11/27/2016 CLINICAL DATA:  Screening. EXAM:  2D DIGITAL SCREENING BILATERAL MAMMOGRAM WITH CAD AND ADJUNCT TOMO COMPARISON:  Previous exam(s). ACR Breast Density Category c: The breast tissue is heterogeneously dense, which may obscure small masses. FINDINGS: There are no findings suspicious for malignancy. Images were processed with CAD. IMPRESSION: No mammographic evidence of malignancy. A result letter of this screening mammogram will be mailed directly to the patient. RECOMMENDATION: Screening mammogram in one year. (Code:SM-B-01Y) BI-RADS CATEGORY  1: Negative. Electronically Signed   By: Ammie Ferrier M.D.   On: 11/27/2016 13:43       Assessment & Plan:   Problem List Items Addressed This Visit    Breast nodule    Right breast nodule as outlined.  Schedule diagnostic mammogram with ultrasound.        Relevant Orders   MM Digital Diagnostic Unilat R   US BREAST LTD UNI [place] care maintenance    Physical today 05/09/17.  Mammogram 11/27/16 - Birads I.  Colonoscopy 08/10/14 - normal.        History of TIA (transient ischemic attack)    On plavix and doing well.        Hypercholesterolemia - Primary    On simvastatin.  Low cholesterol diet and exercise.  Follow lipid panel and liver function tests.        Relevant Medications   simvastatin (ZOCOR) 10 MG tablet   Other Relevant Orders   CBC with Differential/Platelet (Completed)   Hepatic function panel (Completed)   Lipid panel (Completed)   TSH (Completed)   Basic metabolic panel (Completed)   Low back pain    Low back pain and pain extending down into  left buttock and left leg.  Persistent.  Sometimes limits activity.  She continues to exercise.  Will check xray.  Further w/up pending results.        Relevant Orders   DG Lumbar Spine 2-3 Views (Completed)    Other Visit Diagnoses    Loose stools       Persistent after taking abx.  start probiotics.  check stool for c.diff.     Relevant Orders   Clostridium Difficile by PCR       Einar Pheasant, MD

## 2017-05-09 NOTE — Patient Instructions (Signed)
Examples of probiotics:  Florastor, align, [employer] colon health or culturelle.

## 2017-05-10 ENCOUNTER — Encounter: Payer: Self-pay | Admitting: Internal Medicine

## 2017-05-11 LAB — CLOSTRIDIUM DIFFICILE BY PCR: Toxigenic C. Difficile by PCR: NOT DETECTED

## 2017-05-11 NOTE — Telephone Encounter (Signed)
Pt called back returning your call. Please advise, thank you!  Call pt @ [phone]

## 2017-05-13 ENCOUNTER — Other Ambulatory Visit: Payer: Self-pay | Admitting: Internal Medicine

## 2017-05-13 DIAGNOSIS — M5442 Lumbago with sciatica, left side: Secondary | ICD-10-CM

## 2017-05-13 NOTE — Progress Notes (Signed)
Order placed for MRI L- S spine.   

## 2017-05-14 NOTE — Telephone Encounter (Signed)
Called patient she has already received call back on Friday no further questions.

## 2017-05-16 ENCOUNTER — Ambulatory Visit
Admission: RE | Admit: 2017-05-16 | Discharge: 2017-05-16 | Disposition: A | Discharge status: Home / Self Care | Payer: Medicare Other | Source: Ambulatory Visit | Attending: Internal Medicine | Admitting: Internal Medicine

## 2017-05-16 ENCOUNTER — Other Ambulatory Visit: Payer: Self-pay | Admitting: Internal Medicine

## 2017-05-16 DIAGNOSIS — N63 Unspecified lump in unspecified breast: Secondary | ICD-10-CM | POA: Diagnosis present

## 2017-05-16 DIAGNOSIS — N6312 Unspecified lump in the right breast, upper inner quadrant: Secondary | ICD-10-CM | POA: Diagnosis not present

## 2017-05-17 ENCOUNTER — Other Ambulatory Visit: Payer: Self-pay | Admitting: Internal Medicine

## 2017-05-17 DIAGNOSIS — N6310 Unspecified lump in the right breast, unspecified quadrant: Secondary | ICD-10-CM

## 2017-05-17 DIAGNOSIS — R928 Other abnormal and inconclusive findings on diagnostic imaging of breast: Secondary | ICD-10-CM

## 2017-05-18 ENCOUNTER — Ambulatory Visit
Admission: RE | Admit: 2017-05-18 | Discharge: 2017-05-18 | Disposition: A | Discharge status: Home / Self Care | Payer: Medicare Other | Source: Ambulatory Visit | Attending: Internal Medicine | Admitting: Internal Medicine

## 2017-05-18 DIAGNOSIS — R928 Other abnormal and inconclusive findings on diagnostic imaging of breast: Secondary | ICD-10-CM

## 2017-05-18 DIAGNOSIS — N6310 Unspecified lump in the right breast, unspecified quadrant: Secondary | ICD-10-CM | POA: Insufficient documentation

## 2017-05-18 HISTORY — PX: BREAST BIOPSY: SHX20

## 2017-05-19 ENCOUNTER — Other Ambulatory Visit: Payer: Self-pay | Admitting: Internal Medicine

## 2017-05-21 LAB — SURGICAL PATHOLOGY

## 2017-05-24 ENCOUNTER — Ambulatory Visit
Admission: RE | Admit: 2017-05-24 | Discharge: 2017-05-24 | Disposition: A | Discharge status: Home / Self Care | Payer: Medicare Other | Source: Ambulatory Visit | Attending: Internal Medicine | Admitting: Internal Medicine

## 2017-05-24 DIAGNOSIS — M5136 Other intervertebral disc degeneration, lumbar region: Secondary | ICD-10-CM | POA: Diagnosis not present

## 2017-05-24 DIAGNOSIS — M5442 Lumbago with sciatica, left side: Secondary | ICD-10-CM | POA: Diagnosis present

## 2017-05-24 DIAGNOSIS — M48061 Spinal stenosis, lumbar region without neurogenic claudication: Secondary | ICD-10-CM | POA: Insufficient documentation

## 2017-05-25 ENCOUNTER — Other Ambulatory Visit: Payer: Self-pay | Admitting: Internal Medicine

## 2017-05-25 DIAGNOSIS — R937 Abnormal findings on diagnostic imaging of other parts of musculoskeletal system: Secondary | ICD-10-CM

## 2017-05-25 DIAGNOSIS — M5442 Lumbago with sciatica, left side: Secondary | ICD-10-CM

## 2017-05-25 NOTE — Progress Notes (Signed)
Order placed for [facility] referral.

## 2017-08-21 ENCOUNTER — Ambulatory Visit (INDEPENDENT_AMBULATORY_CARE_PROVIDER_SITE_OTHER): Payer: Medicare Other | Admitting: Internal Medicine

## 2017-08-21 ENCOUNTER — Encounter: Payer: Self-pay | Admitting: Internal Medicine

## 2017-08-21 VITALS — BP 110/70 | HR 63 | Temp 98.2°F | Resp 16 | Ht 62.99 in | Wt 133.2 lb

## 2017-08-21 DIAGNOSIS — Z1239 Encounter for other screening for malignant neoplasm of breast: Secondary | ICD-10-CM

## 2017-08-21 DIAGNOSIS — K219 Gastro-esophageal reflux disease without esophagitis: Secondary | ICD-10-CM

## 2017-08-21 DIAGNOSIS — N63 Unspecified lump in unspecified breast: Secondary | ICD-10-CM | POA: Diagnosis not present

## 2017-08-21 DIAGNOSIS — Z1231 Encounter for screening mammogram for malignant neoplasm of breast: Secondary | ICD-10-CM | POA: Diagnosis not present

## 2017-08-21 DIAGNOSIS — Z8673 Personal history of transient ischemic attack (TIA), and cerebral infarction without residual deficits: Secondary | ICD-10-CM | POA: Diagnosis not present

## 2017-08-21 DIAGNOSIS — M5442 Lumbago with sciatica, left side: Secondary | ICD-10-CM | POA: Diagnosis not present

## 2017-08-21 DIAGNOSIS — Z23 Encounter for immunization: Secondary | ICD-10-CM

## 2017-08-21 DIAGNOSIS — E7800 Pure hypercholesterolemia, unspecified: Secondary | ICD-10-CM

## 2017-08-21 NOTE — Progress Notes (Signed)
Patient ID: Joan Ritter, female   DOB: 01/29/1946, 71 y.o.   MRN: [id number]   Subjective:    Patient ID: Joan Ritter, female    DOB: 10-Sep-1946, 71 y.o.   MRN: [id number]  HPI  Patient here for a scheduled follow up.  Has been having back and left leg pain.  MRI as outlined.  Saw Dr Aris Lot and subsequently saw Dr Sharlet Salina.  S/p ESI.  Doing better.  Pain better.  Feels better.  Stays active.  No chest pain.  No sob.  No acid reflux.  No abdominal pain. Bowels moving.  Overall she feels she is doing well.  Recently had breast biopsy.  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  Discussed with her today.  Recommended f/u mammogram in 11/2017.  She is comfortable with this plan.     Past Medical History:  Diagnosis Date  . Allergic rhinitis    seasonal  . Elevated TSH   . Esophagitis   . GERD (gastroesophageal reflux disease)   . History of Helicobacter pylori infection 2012  . Hypercholesterolemia   . Osteopenia   . TIA (transient ischemic attack) 2001   Past Surgical History:  Procedure Laterality Date  . BREAST BIOPSY Right 2000   neg  . BREAST EXCISIONAL BIOPSY Right 1970   NEG  . COLONOSCOPY  2005   normal   . ESOPHAGOGASTRODUODENOSCOPY  2012   normal   . HEMORRHOID SURGERY  1980s   Family History  Problem Relation Age of Onset  . Lymphoma Father   . CVA Mother        h/o cerebral hemorrhage  . Brain cancer Brother   . Cardiomyopathy Sister        enlarged heart, died age 86 - sudden death  . Breast cancer Maternal Aunt        x2  . Colon cancer Neg Hx    Social History   Social History  . Marital status: Married    Spouse name: N/A  . Number of children: 2  . Years of education: N/A   Social History Main Topics  . Smoking status: Never Smoker  . Smokeless tobacco: Never Used  . Alcohol use No  . Drug use: No  . Sexual activity: Not Asked   Other Topics Concern  . None   Social History Narrative  . None    Outpatient Encounter Prescriptions as of 08/21/2017   Medication Sig  . Acetaminophen (TYLENOL ARTHRITIS PAIN PO) Take by mouth 2 (two) times daily.  Marland Kitchen acyclovir (ZOVIRAX) 800 MG tablet Take as directed.  Marland Kitchen aspirin 81 MG tablet Take 81 mg by mouth daily.  . Calcium Carbonate-Vit D-Min 600-400 MG-UNIT TABS Take 1 tablet by mouth 2 (two) times daily.  . cetirizine (ZYRTEC) 10 MG tablet Take 10 mg by mouth daily as needed.   . clopidogrel (PLAVIX) 75 MG tablet Take 1 tablet (75 mg total) by mouth daily.  . fluticasone (FLONASE) 50 MCG/ACT nasal spray USE 2 SPRAYS EACH NOSTRIL EVERY DAY (Patient taking differently: USE 2 SPRAYS EACH NOSTRIL DAILY AS NEEDED)  . Multiple Vitamin (MULTIVITAMIN) tablet Take 1 tablet by mouth daily.  . pantoprazole (PROTONIX) 40 MG tablet Take 1 tablet (40 mg total) by mouth daily.  . Probiotic Product (PROBIOTIC-10 PO) Take by mouth.  . ranitidine (ZANTAC) 150 MG tablet Take 150 mg by mouth at bedtime.  . simvastatin (ZOCOR) 10 MG tablet Take 1 tablet (10 mg total) by mouth at bedtime.  . [  DISCONTINUED] clopidogrel (PLAVIX) 75 MG tablet TAKE 1 TABLET BY MOUTH DAILY  . [DISCONTINUED] gabapentin (NEURONTIN) 100 MG capsule TAKE 2-3 CAPSULES PER DAY   No facility-administered encounter medications on file as of 08/21/2017.     Review of Systems  Constitutional: Negative for appetite change and unexpected weight change.  HENT: Negative for congestion and sinus pressure.   Respiratory: Negative for cough, chest tightness and shortness of breath.   Cardiovascular: Negative for chest pain, palpitations and leg swelling.  Gastrointestinal: Negative for abdominal pain, diarrhea, nausea and vomiting.  Genitourinary: Negative for difficulty urinating and dysuria.  Musculoskeletal: Negative for myalgias.       Back is better.   Skin: Negative for color change and rash.  Neurological: Negative for dizziness, light-headedness and headaches.  Psychiatric/Behavioral: Negative for agitation and dysphoric mood.         Objective:    Physical Exam  Constitutional: She appears well-developed and well-nourished. No distress.  HENT:  Nose: Nose normal.  Mouth/Throat: Oropharynx is clear and moist.  Neck: Neck supple. No thyromegaly present.  Cardiovascular: Normal rate and regular rhythm.   Pulmonary/Chest: Breath sounds normal. No respiratory distress. She has no wheezes.  Breast exam:  No nipple discharge or nipple retraction present.  Palpable superficial nodule 2:00 right breast.  No there palpable nodules or axillary adenopathy.    Abdominal: Soft. Bowel sounds are normal. There is no tenderness.  Musculoskeletal: She exhibits no edema or tenderness.  Lymphadenopathy:    She has no cervical adenopathy.  Skin: No rash noted. No erythema.  Psychiatric: She has a normal mood and affect. Her behavior is normal.    BP 110/70 (BP Location: Left Arm, Patient Position: Sitting, Cuff Size: Normal)   Pulse 63   Temp 98.2 F (36.8 C) (Oral)   Resp 16   Ht 5' 2.99" (1.6 m)   Wt 133 lb 3.2 oz (60.4 kg)   SpO2 95%   BMI 23.60 kg/m  Wt Readings from Last 3 Encounters:  08/21/17 133 lb 3.2 oz (60.4 kg)  05/09/17 135 lb 12.8 oz (61.6 kg)  02/22/17 138 lb (62.6 kg)     Lab Results  Component Value Date   WBC 5.5 05/09/2017   HGB 14.7 05/09/2017   HCT 42.9 05/09/2017   PLT 217.0 05/09/2017   GLUCOSE 81 05/09/2017   CHOL 174 05/09/2017   TRIG 79.0 05/09/2017   HDL 65.90 05/09/2017   LDLCALC 92 05/09/2017   ALT 18 05/09/2017   AST 23 05/09/2017   NA 142 05/09/2017   K 3.9 05/09/2017   CL 105 05/09/2017   CREATININE 0.90 05/09/2017   BUN 15 05/09/2017   CO2 30 05/09/2017   TSH 3.01 05/09/2017    Mr Lumbar Spine Wo Contrast  Result Date: 05/24/2017 CLINICAL DATA:  Low back pain since December 2017. EXAM: MRI LUMBAR SPINE WITHOUT CONTRAST TECHNIQUE: Multiplanar, multisequence MR imaging of the lumbar spine was performed. No intravenous contrast was administered. COMPARISON:  Radiography  05/09/2017 FINDINGS: Segmentation:  Standard. Alignment:  Slight L4-5 anterolisthesis, facet mediated. Vertebrae: No acute fracture, evidence of discitis, or bone lesion. Mildly heterogeneous marrow which may be related to patient's age. Small focus of STIR hyperintensity around a L1 inferior endplate Schmorl's node. Conus medullaris: Extends to the L1 level and appears normal. 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 Disc levels: T12- L1: Unremarkable.  L1-L2: Tiny central disc protrusion. Small facet spurs. No impingement L2-L3: Mild rightward disc bulging. Borderline right facet spurring. No impingement L3-L4: Mild disc narrowing and circumferential bulging. Facet degeneration with mild to moderate spurring. Ventral thecal sac flattening. No impingement L4-L5: Advanced facet arthropathy with bony and ligamentous hypertrophy. Slight anterolisthesis. The disc is narrowed and bulging with a superimposed central protrusion at an annular fissure. High-grade spinal stenosis. Both L5 nerves are compressed in the subarticular recesses. Patent foramina L5-S1:Mild disc bulging and facet spurring.  No impingement IMPRESSION: 1. L4-5 advanced facet arthropathy with mild anterolisthesis. Combined with disc bulging there is advanced spinal stenosis. Both L5 nerves are compressed in the subarticular recesses. 2. Milder and noncompressive degenerative changes are described above. Electronically Signed   By: Monte Fantasia M.D.   On: 05/24/2017 12:43       Assessment & Plan:   Problem List Items Addressed This Visit    Breast nodule    Persistent on exam.  Superficial.  Mammogram as outlined.  S/p biopsy.  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  Recommended f/u mammogram in 11/2016.  Discussed with pt today.        GERD (gastroesophageal reflux disease)    Controlled on current regimen.         Relevant Medications   Probiotic Product (PROBIOTIC-10 PO)   History of TIA (transient ischemic attack)    Doing well on plavix.        Hypercholesterolemia    On simvastatin.  Low cholesterol diet and exercise.  Follow lipid panel and liver function tests.        Relevant Orders   Hepatic function panel   Lipid panel   Basic metabolic panel   Low back pain    S/p MRI as outlined.  Saw Dr Aris Lot and Dr Sharlet Salina.  S/p ESI.  Doing better.  Follow.        Relevant Medications   Acetaminophen (TYLENOL ARTHRITIS PAIN PO)    Other Visit Diagnoses    Need for 23-polyvalent pneumococcal polysaccharide vaccine    -  Primary   Breast cancer screening       Relevant Orders   MM DIGITAL SCREENING BILATERAL       Einar Pheasant, MD

## 2017-08-24 ENCOUNTER — Encounter: Payer: Self-pay | Admitting: Internal Medicine

## 2017-08-24 NOTE — Assessment & Plan Note (Signed)
S/p MRI as outlined.  Saw Dr Aris Lot and Dr Sharlet Salina.  S/p ESI.  Doing better.  Follow.

## 2017-08-24 NOTE — Assessment & Plan Note (Signed)
Persistent on exam.  Superficial.  Mammogram as outlined.  S/p biopsy.  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  Recommended f/u mammogram in 11/2016.  Discussed with pt today.

## 2017-08-24 NOTE — Assessment & Plan Note (Signed)
Doing well on plavix.  

## 2017-08-24 NOTE — Assessment & Plan Note (Signed)
Controlled on current regimen.   

## 2017-08-24 NOTE — Assessment & Plan Note (Signed)
On simvastatin.  Low cholesterol diet and exercise.  Follow lipid panel and liver function tests.   

## 2017-11-16 ENCOUNTER — Ambulatory Visit: Payer: Medicare Other | Admitting: Family Medicine

## 2017-11-16 ENCOUNTER — Encounter: Payer: Self-pay | Admitting: Family Medicine

## 2017-11-16 ENCOUNTER — Other Ambulatory Visit: Payer: Self-pay

## 2017-11-16 VITALS — BP 110/70 | HR 62 | Temp 97.8°F | Wt 131.8 lb

## 2017-11-16 DIAGNOSIS — R102 Pelvic and perineal pain: Secondary | ICD-10-CM

## 2017-11-16 DIAGNOSIS — R1024 Suprapubic pain: Secondary | ICD-10-CM | POA: Insufficient documentation

## 2017-11-16 DIAGNOSIS — N950 Postmenopausal bleeding: Secondary | ICD-10-CM | POA: Diagnosis not present

## 2017-11-16 LAB — URINALYSIS, MICROSCOPIC ONLY

## 2017-11-16 LAB — POCT URINALYSIS DIPSTICK
BILIRUBIN UA: NEGATIVE
GLUCOSE UA: NEGATIVE
KETONES UA: NEGATIVE
Nitrite, UA: POSITIVE
Protein, UA: NEGATIVE
SPEC GRAV UA: 1.015 (ref 1.010–1.025)
Urobilinogen, UA: 0.2 E.U./dL
pH, UA: 6 (ref 5.0–8.0)

## 2017-11-16 MED ORDER — CEPHALEXIN 500 MG PO CAPS: 500.0000 mg | ORAL_CAPSULE | Freq: Two times a day (BID) | ORAL | 0 refills | Status: DC

## 2017-11-16 NOTE — Patient Instructions (Signed)
Nice to see you. We will treat you for a UTI. We will let you know if you need to follow-up with [place] in the office or see gynecology.

## 2017-11-16 NOTE — Progress Notes (Signed)
Patient notified and would like referral

## 2017-11-16 NOTE — Assessment & Plan Note (Addendum)
Question whether or not the symptoms are related to a urinary issue versus gynecologic issue.  Urinalysis concerning for UTI.  Will treat for this.  Pelvic exam was completed today and was benign.  Patient wondered whether or not referral to gynecology was necessary given positive urinalysis.  I advised I would discuss with her PCP.  Discussed with Dr. Nicki Reaper and she was in agreement that patient should see gynecology.  We will refer to gynecology given the concern for postmenopausal bleeding.  If symptoms worsen or she develops new symptoms she should be reevaluated.

## 2017-11-16 NOTE — Progress Notes (Signed)
  Tommi Rumps, MD Phone: [phone]  Joan Ritter is a 72 y.o. female who presents today for same day visit.  Patient notes she got up yesterday and went to the bathroom to urinate and felt some pelvic pressure prior to this and then while wiping noted some light brown material.  No bright red blood.  No blood in her urine.  No dysuria or frequency.  She has some chronic urgency.  No fevers.  No nausea, vomiting, or diarrhea.  No prior surgeries.  Very mild pelvic pressure at this time.  Social History   Tobacco Use  Smoking Status Never Smoker  Smokeless Tobacco Never Used     ROS see history of present illness  Objective  Physical Exam Vitals:   11/16/17 0801  BP: 110/70  Pulse: 62  Temp: 97.8 F (36.6 C)  SpO2: 99%    BP Readings from Last 3 Encounters:  11/16/17 110/70  08/21/17 110/70  05/09/17 110/62   Wt Readings from Last 3 Encounters:  11/16/17 131 lb 12.8 oz (59.8 kg)  08/21/17 133 lb 3.2 oz (60.4 kg)  05/09/17 135 lb 12.8 oz (61.6 kg)    Physical Exam  Constitutional: No distress.  Cardiovascular: Normal rate, regular rhythm and normal heart sounds.  Pulmonary/Chest: Effort normal and breath sounds normal.  Abdominal: Soft. Bowel sounds are normal. She exhibits no distension. There is no tenderness. There is no rebound and no guarding.  Genitourinary:  Genitourinary Comments: Normal labia, normal vaginal mucosa, normal-appearing cervix, no cervical motion tenderness, normal bimanual exam, skin tag noted on rectum does report history of prior hemorrhoids  Musculoskeletal: She exhibits no edema.  Neurological: She is alert. Gait normal.  Skin: Skin is warm and dry. She is not diaphoretic.     Assessment/Plan: Please see individual problem list.  Suprapubic pressure Question whether or not the symptoms are related to a urinary issue versus gynecologic issue.  Urinalysis concerning for UTI.  Will treat for this.  Pelvic exam was completed today  and was benign..  Will refer to gynecology given the concern for postmenopausal bleeding.  If symptoms worsen or she develops new symptoms she should be reevaluated.   Orders Placed This Encounter  Procedures  . Urine Culture  . Urine Microscopic Only  . Ambulatory referral to Gynecology    Referral Priority:   Routine    Referral Type:   Consultation    Referral Reason:   Specialty Services Required    Requested Specialty:   Gynecology    Number of Visits Requested:   1  . POCT Urinalysis Dipstick    Meds ordered this encounter  Medications  . cephALEXin (KEFLEX) 500 MG capsule    Sig: Take 1 capsule (500 mg total) by mouth 2 (two) times daily.    Dispense:  14 capsule    Refill:  0     Tommi Rumps, MD [place]

## 2017-11-19 LAB — URINE CULTURE
MICRO NUMBER:: 90108653
SPECIMEN QUALITY:: ADEQUATE

## 2017-12-07 ENCOUNTER — Encounter: Payer: Self-pay | Admitting: Obstetrics and Gynecology

## 2017-12-07 ENCOUNTER — Ambulatory Visit (INDEPENDENT_AMBULATORY_CARE_PROVIDER_SITE_OTHER): Payer: Medicare Other | Admitting: Obstetrics and Gynecology

## 2017-12-07 VITALS — BP 120/80 | HR 65 | Ht 63.0 in | Wt 134.0 lb

## 2017-12-07 DIAGNOSIS — N3090 Cystitis, unspecified without hematuria: Secondary | ICD-10-CM

## 2017-12-07 DIAGNOSIS — R3121 Asymptomatic microscopic hematuria: Secondary | ICD-10-CM

## 2017-12-07 DIAGNOSIS — N950 Postmenopausal bleeding: Secondary | ICD-10-CM

## 2017-12-07 LAB — POCT URINALYSIS DIPSTICK
APPEARANCE: NORMAL
Bilirubin, UA: NEGATIVE
Blood, UA: NEGATIVE
Glucose, UA: NEGATIVE
Ketones, UA: NEGATIVE
LEUKOCYTES UA: NEGATIVE
NITRITE UA: NEGATIVE
PROTEIN UA: NEGATIVE
Spec Grav, UA: 1.01 (ref 1.010–1.025)
Urobilinogen, UA: 0.2 E.U./dL
pH, UA: 6 (ref 5.0–8.0)

## 2017-12-07 NOTE — Progress Notes (Signed)
Patient ID: Joan Ritter, female   DOB: 21-Oct-1946, 72 y.o.   MRN: [id number]  Reason for Consult: Referral (post menopausal bleeding )   Referred by Einar Pheasant, MD  Subjective:     HPI:  Joan Ritter is a 72 y.o. female  Patient was referred here for evaluation of postmenopausal bleeding. She reports two separate episodes where she saw a small amount of brown discharge. One episode was after intercourse and one episode was after urination. Patient recently had hematuria on urine analysis and there has been uncertainty if this is related. She has complaints of pelvic pressure for 1 month. She denies any changes in appetite. She denies weight gain or weight loss. She is sexually active. She denies dyspareunia, but does report vaginal dryness. She uses Replens for moisture during intercourse. She has had a stroke in the past. She denies the sensation of a vaginal bulge. She denies incontinence. She denies vaginal/vulvar itching, burning, pain or discharge. She reports regular bowel movement. She reports menarche at 79, monopause at 74 and a history of regular monthly menses. She denies any history of abnormal pap smears.  Past Medical History:  Diagnosis Date  . Allergic rhinitis    seasonal  . Elevated TSH   . Esophagitis   . GERD (gastroesophageal reflux disease)   . History of Helicobacter pylori infection 2012  . Hypercholesterolemia   . Osteopenia   . TIA (transient ischemic attack) 2001   Family History  Problem Relation Age of Onset  . Lymphoma Father   . CVA Mother        h/o cerebral hemorrhage  . Brain cancer Brother   . Cardiomyopathy Sister        enlarged heart, died age 83 - sudden death  . Breast cancer Maternal Aunt        x2  . Colon cancer Neg Hx    Past Surgical History:  Procedure Laterality Date  . BREAST BIOPSY Right 2000   neg  . BREAST EXCISIONAL BIOPSY Right 1970   NEG  . COLONOSCOPY  2005   normal   . ESOPHAGOGASTRODUODENOSCOPY  2012   normal   . HEMORRHOID SURGERY  1980s    Short Social History:  Social History   Tobacco Use  . Smoking status: Never Smoker  . Smokeless tobacco: Never Used  Substance Use Topics  . Alcohol use: No    Alcohol/week: 0.0 oz    Allergies  Allergen Reactions  . No Known Drug Allergy     Current Outpatient Medications  Medication Sig Dispense Refill  . Acetaminophen (TYLENOL ARTHRITIS PAIN PO) Take by mouth 2 (two) times daily.    Marland Kitchen acyclovir (ZOVIRAX) 800 MG tablet Take as directed. 30 tablet q  . aspirin 81 MG tablet Take 81 mg by mouth daily.    . Calcium Carbonate-Vit D-Min 600-400 MG-UNIT TABS Take 1 tablet by mouth 2 (two) times daily.    . cetirizine (ZYRTEC) 10 MG tablet Take 10 mg by mouth daily as needed.     . clopidogrel (PLAVIX) 75 MG tablet Take 1 tablet (75 mg total) by mouth daily. 90 tablet 2  . fluticasone (FLONASE) 50 MCG/ACT nasal spray USE 2 SPRAYS EACH NOSTRIL EVERY DAY (Patient taking differently: USE 2 SPRAYS EACH NOSTRIL DAILY AS NEEDED) 16 g 5  . Multiple Vitamin (MULTIVITAMIN) tablet Take 1 tablet by mouth daily.    . pantoprazole (PROTONIX) 40 MG tablet Take 1 tablet (40 mg total) by mouth daily.  90 tablet 1  . Probiotic Product (PROBIOTIC-10 PO) Take by mouth.    . ranitidine (ZANTAC) 150 MG tablet Take 150 mg by mouth at bedtime.    . simvastatin (ZOCOR) 10 MG tablet Take 1 tablet (10 mg total) by mouth at bedtime. 90 tablet 1  . cephALEXin (KEFLEX) 500 MG capsule Take 1 capsule (500 mg total) by mouth 2 (two) times daily. (Patient not taking: Reported on 12/07/2017) 14 capsule 0   No current facility-administered medications for this visit.     Review of Systems  Constitutional: Negative for chills, fatigue, fever and unexpected weight change.  HENT: Negative for trouble swallowing.  Eyes: Negative for loss of vision.  Respiratory: Negative for cough, shortness of breath and wheezing.  Cardiovascular: Negative for chest pain, leg swelling,  palpitations and syncope.  GI: Negative for abdominal pain, blood in stool, diarrhea, nausea and vomiting.  GU: Negative for difficulty urinating, dysuria, frequency and hematuria.  Musculoskeletal: Negative for back pain, leg pain and joint pain.  Skin: Negative for rash.  Neurological: Negative for dizziness, headaches, light-headedness, numbness and seizures.  Psychiatric: Negative for behavioral problem, confusion, depressed mood and sleep disturbance.        Objective:  Objective   Vitals:   12/07/17 1013  BP: 120/80  Pulse: 65  Weight: 134 lb (60.8 kg)  Height: 5\' 3"  (1.6 m)   Body mass index is 23.74 kg/m.  Physical Exam  Constitutional: She is oriented to person, place, and time. She appears well-developed and well-nourished.  HENT:  Head: Normocephalic and atraumatic.  Eyes: EOM are normal.  Cardiovascular: Normal rate, regular rhythm and normal heart sounds.  Pulmonary/Chest: Effort normal and breath sounds normal.  Genitourinary: Vagina normal and uterus normal. Rectal exam shows no external hemorrhoid, no fissure, no mass and no tenderness. Uterus is not enlarged, not fixed and not tender. Cervix exhibits no discharge. Right adnexum displays no mass, no tenderness and no fullness. Left adnexum displays no mass, no tenderness and no fullness. No vaginal discharge found.  Genitourinary Comments: No prolapse appreciated on exam.  Neurological: She is alert and oriented to person, place, and time.  Skin: Skin is warm and dry.  Psychiatric: She has a normal mood and affect. Her behavior is normal. Judgment and thought content normal.  Nursing note and vitals reviewed.      Assessment/Plan:     Pelvic pressure and scant brown vaginal discharge. Will obtain transvaginal US. If endometrium is thin bleeding likely atrophic. If thickened more than 5 mm will need to perform endometrial biopsy. This was discussed with the patient.  Dyspareunia- discussed options with  patient such as Intrarosa and the MonaLisa Touch procedure. Patient was given a sample of Intrarosa. Not data available about possible risk of stroke with Intrarosa, but medication is a locally active estrogen metabolite so theoretically there is a low risk.  Microscopic hematuria, urine sample sent for evaluation. UA in office was negative today. Consider referral to urology if this is an ongoing issue.   Homero Fellers MD [facility] 12/09/17 2:38 PM

## 2017-12-08 LAB — URINALYSIS, MICROSCOPIC ONLY
Casts: NONE SEEN /lpf
Epithelial Cells (non renal): NONE SEEN /hpf (ref 0–10)
RBC MICROSCOPIC, UA: NONE SEEN /HPF (ref 0–?)

## 2017-12-09 ENCOUNTER — Encounter: Payer: Self-pay | Admitting: Obstetrics and Gynecology

## 2017-12-09 LAB — URINE CULTURE: ORGANISM ID, BACTERIA: NO GROWTH

## 2017-12-10 NOTE — Progress Notes (Signed)
Negative, released to mychart

## 2017-12-11 ENCOUNTER — Ambulatory Visit
Admission: RE | Admit: 2017-12-11 | Discharge: 2017-12-11 | Disposition: A | Discharge status: Home / Self Care | Payer: Medicare Other | Source: Ambulatory Visit | Attending: Internal Medicine | Admitting: Internal Medicine

## 2017-12-11 DIAGNOSIS — Z1239 Encounter for other screening for malignant neoplasm of breast: Secondary | ICD-10-CM

## 2017-12-11 DIAGNOSIS — Z1231 Encounter for screening mammogram for malignant neoplasm of breast: Secondary | ICD-10-CM | POA: Insufficient documentation

## 2017-12-24 ENCOUNTER — Ambulatory Visit (INDEPENDENT_AMBULATORY_CARE_PROVIDER_SITE_OTHER): Payer: Medicare Other | Admitting: Obstetrics and Gynecology

## 2017-12-24 ENCOUNTER — Ambulatory Visit (INDEPENDENT_AMBULATORY_CARE_PROVIDER_SITE_OTHER): Payer: Medicare Other

## 2017-12-24 ENCOUNTER — Encounter: Payer: Self-pay | Admitting: Obstetrics and Gynecology

## 2017-12-24 VITALS — BP 104/64 | HR 58 | Ht 63.0 in | Wt 133.0 lb

## 2017-12-24 DIAGNOSIS — N950 Postmenopausal bleeding: Secondary | ICD-10-CM | POA: Diagnosis not present

## 2017-12-24 NOTE — Progress Notes (Signed)
Patient ID: Joan Ritter, female   DOB: 04/04/1946, 72 y.o.   MRN: [id number]  Reason for Consult: Follow-up (gyn u/s)   Referred by Einar Pheasant, MD  Subjective:     HPI:  Joan Ritter is a 72 y.o. female she is following up today for an ultrasound for postmenopausal bleeding. She reports that she has not had any further vaginal bleeding. She has not used the intrarosa and is not interested in using it at this time.    Past Medical History:  Diagnosis Date  . Allergic rhinitis    seasonal  . Elevated TSH   . Esophagitis   . GERD (gastroesophageal reflux disease)   . History of Helicobacter pylori infection 2012  . Hypercholesterolemia   . Osteopenia   . TIA (transient ischemic attack) 2001   Family History  Problem Relation Age of Onset  . Lymphoma Father   . CVA Mother        h/o cerebral hemorrhage  . Brain cancer Brother   . Cardiomyopathy Sister        enlarged heart, died age 19 - sudden death  . Breast cancer Maternal Aunt        x2  . Colon cancer Neg Hx    Past Surgical History:  Procedure Laterality Date  . BREAST BIOPSY Right 2000   neg  . BREAST BIOPSY Right 05/18/2017   neg  . BREAST EXCISIONAL BIOPSY Right 1970   NEG  . COLONOSCOPY  2005   normal   . ESOPHAGOGASTRODUODENOSCOPY  2012   normal   . HEMORRHOID SURGERY  1980s    Short Social History:  Social History   Tobacco Use  . Smoking status: Never Smoker  . Smokeless tobacco: Never Used  Substance Use Topics  . Alcohol use: No    Alcohol/week: 0.0 oz    Allergies  Allergen Reactions  . No Known Drug Allergy     Current Outpatient Medications  Medication Sig Dispense Refill  . Acetaminophen (TYLENOL ARTHRITIS PAIN PO) Take by mouth 2 (two) times daily.    Marland Kitchen acyclovir (ZOVIRAX) 800 MG tablet Take as directed. 30 tablet q  . aspirin 81 MG tablet Take 81 mg by mouth daily.    . Calcium Carbonate-Vit D-Min 600-400 MG-UNIT TABS Take 1 tablet by mouth 2 (two) times daily.    .  cephALEXin (KEFLEX) 500 MG capsule Take 1 capsule (500 mg total) by mouth 2 (two) times daily. (Patient not taking: Reported on 12/07/2017) 14 capsule 0  . cetirizine (ZYRTEC) 10 MG tablet Take 10 mg by mouth daily as needed.     . clopidogrel (PLAVIX) 75 MG tablet Take 1 tablet (75 mg total) by mouth daily. 90 tablet 2  . fluticasone (FLONASE) 50 MCG/ACT nasal spray USE 2 SPRAYS EACH NOSTRIL EVERY DAY (Patient taking differently: USE 2 SPRAYS EACH NOSTRIL DAILY AS NEEDED) 16 g 5  . Multiple Vitamin (MULTIVITAMIN) tablet Take 1 tablet by mouth daily.    . pantoprazole (PROTONIX) 40 MG tablet Take 1 tablet (40 mg total) by mouth daily. 90 tablet 1  . Probiotic Product (PROBIOTIC-10 PO) Take by mouth.    . ranitidine (ZANTAC) 150 MG tablet Take 150 mg by mouth at bedtime.    . simvastatin (ZOCOR) 10 MG tablet Take 1 tablet (10 mg total) by mouth at bedtime. 90 tablet 1   No current facility-administered medications for this visit.     Review of Systems  Constitutional: Negative for chills,  fatigue, fever and unexpected weight change.  HENT: Negative for trouble swallowing.  Eyes: Negative for loss of vision.  Respiratory: Negative for cough, shortness of breath and wheezing.  Cardiovascular: Negative for chest pain, leg swelling, palpitations and syncope.  GI: Negative for abdominal pain, blood in stool, diarrhea, nausea and vomiting.  GU: Negative for difficulty urinating, dysuria, frequency and hematuria.  Musculoskeletal: Negative for back pain, leg pain and joint pain.  Skin: Negative for rash.  Neurological: Negative for dizziness, headaches, light-headedness, numbness and seizures.  Psychiatric: Negative for behavioral problem, confusion, depressed mood and sleep disturbance.        Objective:  Objective   Vitals:   12/24/17 1420  BP: 104/64  Pulse: (!) 58  Weight: 133 lb (60.3 kg)  Height: 5\' 3"  (1.6 m)   Body mass index is 23.56 kg/m.  Physical Exam  Constitutional: She  is oriented to person, place, and time. She appears well-developed and well-nourished.  HENT:  Head: Normocephalic and atraumatic.  Cardiovascular: Normal rate.  Abdominal: Soft.  Neurological: She is alert and oriented to person, place, and time.  Skin: Skin is warm and dry.  Psychiatric: She has a normal mood and affect. Her behavior is normal. Thought content normal.    Data: TVUS showed the endometrial thickness to be 1.18mm, atrophic     Assessment/Plan:     72 with postmenopausal bleeding  Endometrial lining is less than 16mm. This means that the patient is in a very low risk group for endometrial cancer or hyperplasia. No biopsy needed. Asked the patient to return if bleeding occurs again since any episodes of postmenopausal bleeding needs to be evaluated.   [facility] 12/24/17 2:43 PM

## 2018-01-31 ENCOUNTER — Other Ambulatory Visit: Payer: Self-pay | Admitting: Internal Medicine

## 2018-02-06 ENCOUNTER — Other Ambulatory Visit (INDEPENDENT_AMBULATORY_CARE_PROVIDER_SITE_OTHER): Payer: Medicare Other

## 2018-02-06 DIAGNOSIS — E7800 Pure hypercholesterolemia, unspecified: Secondary | ICD-10-CM | POA: Diagnosis not present

## 2018-02-06 LAB — HEPATIC FUNCTION PANEL
ALT: 17 U/L (ref 0–35)
AST: 17 U/L (ref 0–37)
Albumin: 3.8 g/dL (ref 3.5–5.2)
Alkaline Phosphatase: 52 U/L (ref 39–117)
BILIRUBIN TOTAL: 0.4 mg/dL (ref 0.2–1.2)
Bilirubin, Direct: 0.1 mg/dL (ref 0.0–0.3)
Total Protein: 6.2 g/dL (ref 6.0–8.3)

## 2018-02-06 LAB — BASIC METABOLIC PANEL
BUN: 15 mg/dL (ref 6–23)
CALCIUM: 9.2 mg/dL (ref 8.4–10.5)
CO2: 29 meq/L (ref 19–32)
Chloride: 106 mEq/L (ref 96–112)
Creatinine, Ser: 0.85 mg/dL (ref 0.40–1.20)
GFR: 69.84 mL/min (ref >60.00)
GLUCOSE: 87 mg/dL (ref 70–99)
Potassium: 4.1 mEq/L (ref 3.5–5.1)
Sodium: 142 mEq/L (ref 135–145)

## 2018-02-06 LAB — LIPID PANEL
CHOL/HDL RATIO: 3
Cholesterol: 182 mg/dL (ref 0–200)
HDL: 57.5 mg/dL (ref >39.00)
LDL Cholesterol: 104 mg/dL — ABNORMAL HIGH (ref 0–99)
NONHDL: 124.59
Triglycerides: 104 mg/dL (ref 0.0–149.0)
VLDL: 20.8 mg/dL (ref 0.0–40.0)

## 2018-02-15 ENCOUNTER — Other Ambulatory Visit: Payer: Medicare Other

## 2018-02-19 ENCOUNTER — Ambulatory Visit: Payer: Medicare Other | Admitting: Internal Medicine

## 2018-02-19 ENCOUNTER — Encounter: Payer: Self-pay | Admitting: Internal Medicine

## 2018-02-19 DIAGNOSIS — E7800 Pure hypercholesterolemia, unspecified: Secondary | ICD-10-CM | POA: Diagnosis not present

## 2018-02-19 DIAGNOSIS — M5441 Lumbago with sciatica, right side: Secondary | ICD-10-CM | POA: Diagnosis not present

## 2018-02-19 DIAGNOSIS — R0989 Other specified symptoms and signs involving the circulatory and respiratory systems: Secondary | ICD-10-CM

## 2018-02-19 DIAGNOSIS — K219 Gastro-esophageal reflux disease without esophagitis: Secondary | ICD-10-CM | POA: Diagnosis not present

## 2018-02-19 DIAGNOSIS — Z8673 Personal history of transient ischemic attack (TIA), and cerebral infarction without residual deficits: Secondary | ICD-10-CM | POA: Diagnosis not present

## 2018-02-19 MED ORDER — SIMVASTATIN 20 MG PO TABS: 20.0000 mg | ORAL_TABLET | Freq: Every day | ORAL | 3 refills | Status: DC

## 2018-02-19 NOTE — Progress Notes (Signed)
Patient ID: Joan Ritter, female   DOB: 1946/02/25, 72 y.o.   MRN: [id number]   Subjective:    Patient ID: Joan Ritter, female    DOB: 1946-04-29, 72 y.o.   MRN: [id number]  HPI  Patient here for a scheduled follow up.  States she is doing relatively well.  Has been having increased and persistent pain in her right low back and right leg.  Worse in am.  Has seen Dr Sharlet Salina previously.  Request referral back.  Stays active.  No chest pain.  No sob.  No acid reflux.  No abdominal pain. Bowels moving.  No urine change. Recently worked up for post menopausal bleeding.  Negative w/up.  No further bleeding.  Discussed lab results.  Discussed cholesterol.     Past Medical History:  Diagnosis Date  . Allergic rhinitis    seasonal  . Elevated TSH   . Esophagitis   . GERD (gastroesophageal reflux disease)   . History of Helicobacter pylori infection 2012  . Hypercholesterolemia   . Osteopenia   . TIA (transient ischemic attack) 2001   Past Surgical History:  Procedure Laterality Date  . BREAST BIOPSY Right 2000   neg  . BREAST BIOPSY Right 05/18/2017   neg  . BREAST EXCISIONAL BIOPSY Right 1970   NEG  . COLONOSCOPY  2005   normal   . ESOPHAGOGASTRODUODENOSCOPY  2012   normal   . HEMORRHOID SURGERY  1980s   Family History  Problem Relation Age of Onset  . Lymphoma Father   . CVA Mother        h/o cerebral hemorrhage  . Brain cancer Brother   . Cardiomyopathy Sister        enlarged heart, died age 46 - sudden death  . Breast cancer Maternal Aunt        x2  . Colon cancer Neg Hx    Social History   Socioeconomic History  . Marital status: Married    Spouse name: Not on file  . Number of children: 2  . Years of education: Not on file  . Highest education level: Not on file  Occupational History  . Not on file  Social Needs  . Financial resource strain: Not on file  . Food insecurity:    Worry: Not on file    Inability: Not on file  . Transportation needs:   Medical: Not on file    Non-medical: Not on file  Tobacco Use  . Smoking status: Never Smoker  . Smokeless tobacco: Never Used  Substance and Sexual Activity  . Alcohol use: No    Alcohol/week: 0.0 oz  . Drug use: No  . Sexual activity: Yes    Birth control/protection: Post-menopausal  Lifestyle  . Physical activity:    Days per week: 4 days    Minutes per session: 60 min  . Stress: Not at all  Relationships  . Social connections:    Talks on phone: Not on file    Gets together: Not on file    Attends religious service: Not on file    Active member of club or organization: Not on file    Attends meetings of clubs or organizations: Not on file    Relationship status: Not on file  Other Topics Concern  . Not on file  Social History Narrative  . Not on file    Outpatient Encounter Medications as of 02/19/2018  Medication Sig  . Acetaminophen (TYLENOL ARTHRITIS PAIN PO) Take by mouth  2 (two) times daily.  Marland Kitchen acyclovir (ZOVIRAX) 800 MG tablet Take as directed.  Marland Kitchen aspirin 81 MG tablet Take 81 mg by mouth daily.  . Calcium Carbonate-Vit D-Min 600-400 MG-UNIT TABS Take 1 tablet by mouth 2 (two) times daily.  . cetirizine (ZYRTEC) 10 MG tablet Take 10 mg by mouth daily as needed.   . clopidogrel (PLAVIX) 75 MG tablet Take 1 tablet (75 mg total) by mouth daily.  . fluticasone (FLONASE) 50 MCG/ACT nasal spray USE 2 SPRAYS EACH NOSTRIL EVERY DAY (Patient taking differently: USE 2 SPRAYS EACH NOSTRIL DAILY AS NEEDED)  . Multiple Vitamin (MULTIVITAMIN) tablet Take 1 tablet by mouth daily.  . pantoprazole (PROTONIX) 40 MG tablet Take 1 tablet (40 mg total) by mouth daily.  . Probiotic Product (PROBIOTIC-10 PO) Take by mouth.  . ranitidine (ZANTAC) 150 MG tablet Take 150 mg by mouth at bedtime.  . [DISCONTINUED] simvastatin (ZOCOR) 10 MG tablet TAKE 1 TABLET BY MOUTH EVERYDAY AT BEDTIME  . simvastatin (ZOCOR) 20 MG tablet Take 1 tablet (20 mg total) by mouth at bedtime.  . [DISCONTINUED]  cephALEXin (KEFLEX) 500 MG capsule Take 1 capsule (500 mg total) by mouth 2 (two) times daily. (Patient not taking: Reported on 12/07/2017)   No facility-administered encounter medications on file as of 02/19/2018.     Review of Systems  Constitutional: Negative for appetite change and unexpected weight change.  HENT: Negative for congestion and sinus pressure.   Respiratory: Negative for cough, chest tightness and shortness of breath.   Cardiovascular: Negative for chest pain, palpitations and leg swelling.  Gastrointestinal: Negative for abdominal pain, diarrhea, nausea and vomiting.  Genitourinary: Negative for difficulty urinating and dysuria.  Musculoskeletal: Negative for joint swelling and myalgias.       Right low back pain and pain in right leg.    Skin: Negative for color change and rash.  Neurological: Negative for dizziness, light-headedness and headaches.  Psychiatric/Behavioral: Negative for agitation and dysphoric mood.       Objective:    Physical Exam  Constitutional: She appears well-developed and well-nourished. No distress.  HENT:  Nose: Nose normal.  Mouth/Throat: Oropharynx is clear and moist.  Neck: Neck supple. No thyromegaly present.  Cardiovascular: Normal rate and regular rhythm.  Left carotid bruit.    Pulmonary/Chest: Breath sounds normal. No respiratory distress. She has no wheezes.  Abdominal: Soft. Bowel sounds are normal. There is no tenderness.  Musculoskeletal: She exhibits no edema or tenderness.  Lymphadenopathy:    She has no cervical adenopathy.  Skin: No rash noted. No erythema.  Psychiatric: She has a normal mood and affect. Her behavior is normal.    BP 108/72 (BP Location: Left Arm, Patient Position: Sitting, Cuff Size: Normal)   Pulse (!) 59   Temp 97.8 F (36.6 C) (Oral)   Resp 16   Ht 5\' 3"  (1.6 m)   Wt 132 lb 6 oz (60 kg)   SpO2 98%   BMI 23.45 kg/m  Wt Readings from Last 3 Encounters:  02/19/18 132 lb 6 oz (60 kg)    12/24/17 133 lb (60.3 kg)  12/07/17 134 lb (60.8 kg)     Lab Results  Component Value Date   WBC 5.5 05/09/2017   HGB 14.7 05/09/2017   HCT 42.9 05/09/2017   PLT 217.0 05/09/2017   GLUCOSE 87 02/06/2018   CHOL 182 02/06/2018   TRIG 104.0 02/06/2018   HDL 57.50 02/06/2018   LDLCALC 104 (H) 02/06/2018   ALT 17 02/06/2018  AST 17 02/06/2018   NA 142 02/06/2018   K 4.1 02/06/2018   CL 106 02/06/2018   CREATININE 0.85 02/06/2018   BUN 15 02/06/2018   CO2 29 02/06/2018   TSH 3.01 05/09/2017    Mm Screening Breast Tomo Bilateral  Result Date: 12/11/2017 CLINICAL DATA:  Screening. EXAM: DIGITAL SCREENING BILATERAL MAMMOGRAM WITH TOMO AND CAD COMPARISON:  Previous exam(s). ACR Breast Density Category c: The breast tissue is heterogeneously dense, which may obscure small masses. FINDINGS: There are no findings suspicious for malignancy. Images were processed with CAD. IMPRESSION: No mammographic evidence of malignancy. A result letter of this screening mammogram will be mailed directly to the patient. RECOMMENDATION: Screening mammogram in one year. (Code:SM-B-01Y) BI-RADS CATEGORY  1: Negative. Electronically Signed   By: Everlean Alstrom M.D.   On: 12/11/2017 11:36       Assessment & Plan:   Problem List Items Addressed This Visit    Back pain    Previous MRI as outlined.  Has seen Dr Sharlet Salina previously.  Persistent pain.  Request referral back to Dr Sharlet Salina for evaluation and treatment.        Relevant Orders   Ambulatory referral to Orthopedic Surgery   GERD (gastroesophageal reflux disease)    Controlled.        History of TIA (transient ischemic attack)    Doing well on plavix.        Hypercholesterolemia    On simvastatin.  Low cholesterol diet and exercise.  discussed recent labs.  Will increase simvastatin to 20mg  q day.  Follow lipid panel and liver function tests.        Relevant Medications   simvastatin (ZOCOR) 20 MG tablet   Other Relevant Orders   CBC  with Differential/Platelet   Hepatic function panel   Lipid panel   TSH   Basic metabolic panel   Left carotid bruit    Schedule carotid ultrasound.       Relevant Orders   VAS US CAROTID       Einar Pheasant, MD

## 2018-02-19 NOTE — Progress Notes (Signed)
Pre-visit discussion using our clinic review tool. No additional management support is needed unless otherwise documented below in the visit note.  

## 2018-02-24 ENCOUNTER — Encounter: Payer: Self-pay | Admitting: Internal Medicine

## 2018-02-24 DIAGNOSIS — R0989 Other specified symptoms and signs involving the circulatory and respiratory systems: Secondary | ICD-10-CM | POA: Insufficient documentation

## 2018-02-24 NOTE — Assessment & Plan Note (Signed)
Doing well on plavix.  

## 2018-02-24 NOTE — Assessment & Plan Note (Signed)
Previous MRI as outlined.  Has seen Dr Sharlet Salina previously.  Persistent pain.  Request referral back to Dr Sharlet Salina for evaluation and treatment.

## 2018-02-24 NOTE — Assessment & Plan Note (Addendum)
On simvastatin.  Low cholesterol diet and exercise.  discussed recent labs.  Will increase simvastatin to 20mg  q day.  Follow lipid panel and liver function tests.

## 2018-02-24 NOTE — Assessment & Plan Note (Signed)
Schedule carotid ultrasound.  

## 2018-02-24 NOTE — Assessment & Plan Note (Signed)
Controlled.  

## 2018-02-25 ENCOUNTER — Ambulatory Visit: Payer: Medicare Other

## 2018-04-09 ENCOUNTER — Encounter: Payer: Self-pay | Admitting: Internal Medicine

## 2018-04-09 DIAGNOSIS — R0989 Other specified symptoms and signs involving the circulatory and respiratory systems: Secondary | ICD-10-CM

## 2018-04-12 NOTE — Telephone Encounter (Signed)
Please confirm with pt.  Per note, we were going to schedule a carotid ultrasound for carotid bruit.  I placed the order for this back in 02/2018.  See if she has heard about this being scheduled.  If no, I can send Melissa a note to schedule.  Also confirm does not need anything more.  Thanks

## 2018-04-12 NOTE — Telephone Encounter (Signed)
Pa 

## 2018-04-12 NOTE — Telephone Encounter (Signed)
Left message to call office

## 2018-04-12 NOTE — Telephone Encounter (Signed)
Joan Ritter.  I ordered a carotid ultrasound (under CV procedures).  I just ordered the carotid ultrasound to be performed and did not put this in as a vascular surgery consult.  Do I need to change something?  Just let me know what you need me to do.  Thanks

## 2018-04-12 NOTE — Telephone Encounter (Signed)
Not seeing a referral open in appt.  She did not receive any call to set up appt.   Will need new referral and please call pt.  Will be out of town July 7-21

## 2018-04-16 IMAGING — MG MM BREAST LOCALIZATION CLIP
2 series · 2 of 2 positions shown · non-contrast
Comparison: Previous exam(s).

CLINICAL DATA: Post ultrasound-guided core needle biopsy of the
right breast.

EXAM:
DIAGNOSTIC RIGHT MAMMOGRAM POST ULTRASOUND BIOPSY

[R ML]
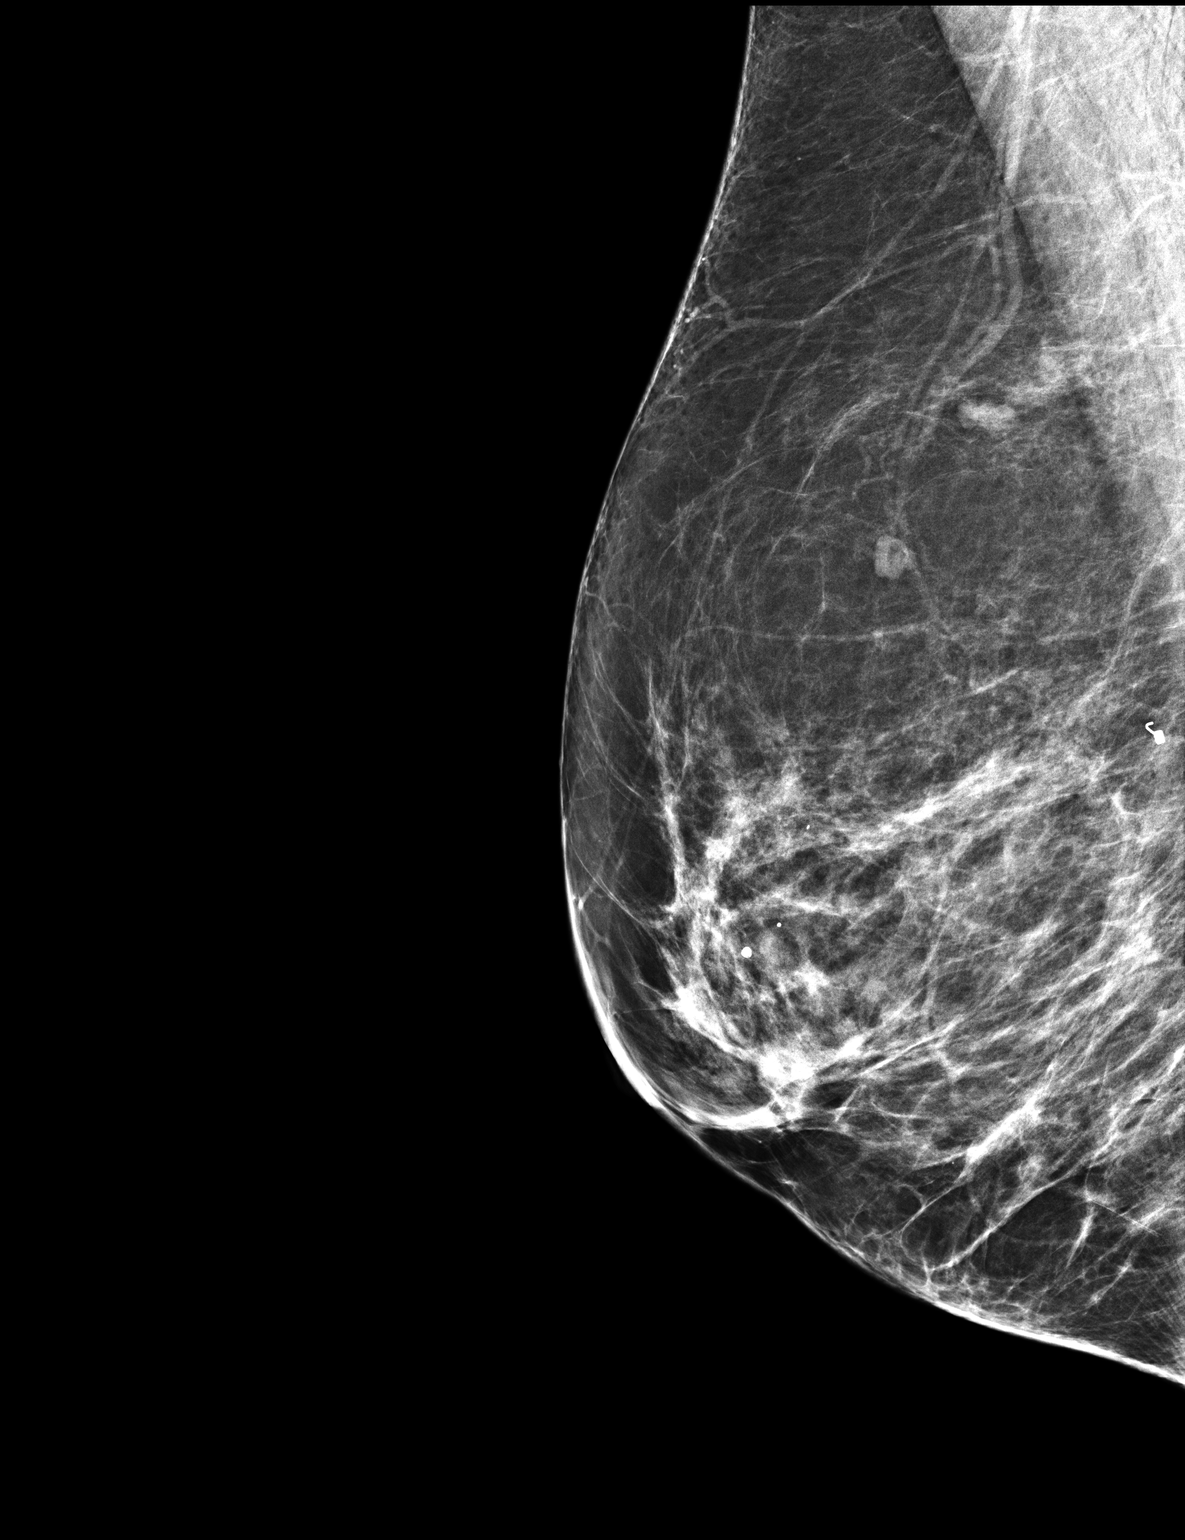

[R CC]
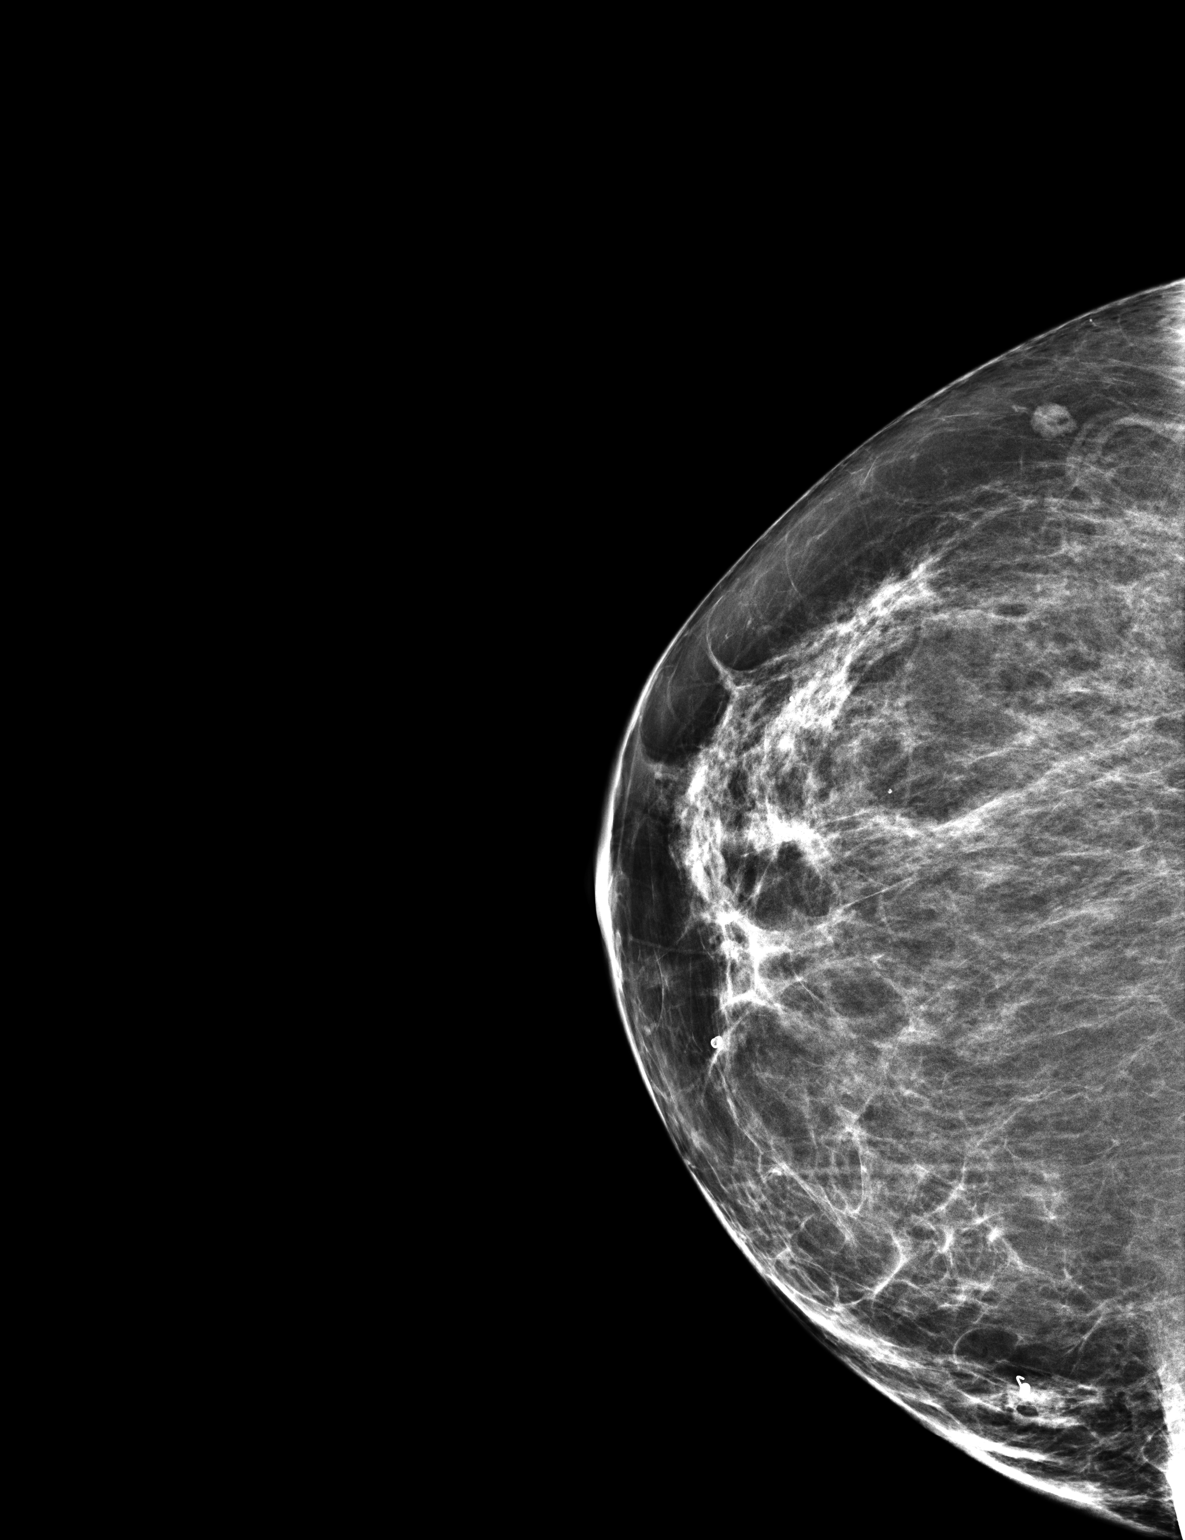

[2 of 2 positions shown; findings below may reference images not displayed]

FINDINGS: Mammographic images were obtained following ultrasound guided biopsy
of right breast 2 o'clock mass. Two-view mammography demonstrates
presence of coil shaped marker within the biopsy site. Expected post
biopsy changes are seen.
IMPRESSION: Successful placement of a coil shaped marker within the right
breast, post ultrasound-guided core needle biopsy.

Final Assessment: Post Procedure Mammograms for Marker Placement

## 2018-04-22 ENCOUNTER — Other Ambulatory Visit: Payer: Self-pay | Admitting: Internal Medicine

## 2018-04-22 DIAGNOSIS — R0989 Other specified symptoms and signs involving the circulatory and respiratory systems: Secondary | ICD-10-CM

## 2018-04-22 NOTE — Progress Notes (Signed)
Order placed for carotid ultrasound at [facility]

## 2018-04-23 NOTE — Telephone Encounter (Signed)
See note to referrals.

## 2018-04-24 ENCOUNTER — Other Ambulatory Visit: Payer: Self-pay | Admitting: Internal Medicine

## 2018-05-20 ENCOUNTER — Telehealth: Payer: Self-pay

## 2018-05-20 DIAGNOSIS — R0989 Other specified symptoms and signs involving the circulatory and respiratory systems: Secondary | ICD-10-CM

## 2018-05-20 NOTE — Telephone Encounter (Signed)
[place] ordered. Will call [place] in the morning to f/u on why it has not been scheduled.

## 2018-05-20 NOTE — Telephone Encounter (Signed)
Pt called b/c she has been out of town for a while but is now back at home; pt states she would like to be contacted about any information that she needs to be updated on, contact to advise

## 2018-05-20 NOTE — Telephone Encounter (Signed)
Copied from [place] [id number]. Topic: General - Other >> May 20, 2018  4:09 PM Keene Breath wrote: Reason for CRM: Patient called to get an update on a referral for a carotid test that the doctor said she would need.  Please advise.  CB# [phone].

## 2018-05-21 NOTE — Telephone Encounter (Signed)
[place] reordered for [facility], Joan Ritter is going to call and schedule. This message is just an [place]

## 2018-05-21 NOTE — Addendum Note (Signed)
Addended by: Lars Masson on: 05/21/2018 11:50 AM   Modules accepted: Orders

## 2018-05-21 NOTE — Telephone Encounter (Signed)
Spoke with patient regarding this. Will call [facility]

## 2018-05-22 ENCOUNTER — Ambulatory Visit
Admission: RE | Admit: 2018-05-22 | Discharge: 2018-05-22 | Disposition: A | Discharge status: Home / Self Care | Payer: Medicare Other | Source: Ambulatory Visit | Attending: Internal Medicine | Admitting: Internal Medicine

## 2018-05-22 DIAGNOSIS — R0989 Other specified symptoms and signs involving the circulatory and respiratory systems: Secondary | ICD-10-CM | POA: Diagnosis not present

## 2018-05-26 ENCOUNTER — Other Ambulatory Visit: Payer: Self-pay | Admitting: Internal Medicine

## 2018-07-01 ENCOUNTER — Other Ambulatory Visit (INDEPENDENT_AMBULATORY_CARE_PROVIDER_SITE_OTHER): Payer: Medicare Other

## 2018-07-01 ENCOUNTER — Encounter: Payer: Self-pay | Admitting: Internal Medicine

## 2018-07-01 DIAGNOSIS — E7800 Pure hypercholesterolemia, unspecified: Secondary | ICD-10-CM | POA: Diagnosis not present

## 2018-07-01 LAB — CBC WITH DIFFERENTIAL/PLATELET
BASOS PCT: 1.2 % (ref 0.0–3.0)
Basophils Absolute: 0.1 10*3/uL (ref 0.0–0.1)
EOS PCT: 4.7 % (ref 0.0–5.0)
Eosinophils Absolute: 0.3 10*3/uL (ref 0.0–0.7)
HCT: 40.2 % (ref 36.0–46.0)
HEMOGLOBIN: 13.8 g/dL (ref 12.0–15.0)
Lymphocytes Relative: 38.8 % (ref 12.0–46.0)
Lymphs Abs: 2.1 10*3/uL (ref 0.7–4.0)
MCHC: 34.3 g/dL (ref 30.0–36.0)
MCV: 93.3 fl (ref 78.0–100.0)
MONOS PCT: 10.1 % (ref 3.0–12.0)
Monocytes Absolute: 0.6 10*3/uL (ref 0.1–1.0)
Neutro Abs: 2.5 10*3/uL (ref 1.4–7.7)
Neutrophils Relative %: 45.2 % (ref 43.0–77.0)
Platelets: 196 10*3/uL (ref 150.0–400.0)
RBC: 4.31 Mil/uL (ref 3.87–5.11)
RDW: 12.6 % (ref 11.5–15.5)
WBC: 5.5 10*3/uL (ref 4.0–10.5)

## 2018-07-01 LAB — LIPID PANEL
CHOL/HDL RATIO: 3
Cholesterol: 155 mg/dL (ref 0–200)
HDL: 56.8 mg/dL (ref >39.00)
LDL Cholesterol: 77 mg/dL (ref 0–99)
NONHDL: 97.72
Triglycerides: 105 mg/dL (ref 0.0–149.0)
VLDL: 21 mg/dL (ref 0.0–40.0)

## 2018-07-01 LAB — HEPATIC FUNCTION PANEL
ALBUMIN: 3.7 g/dL (ref 3.5–5.2)
ALT: 17 U/L (ref 0–35)
AST: 17 U/L (ref 0–37)
Alkaline Phosphatase: 45 U/L (ref 39–117)
BILIRUBIN TOTAL: 0.6 mg/dL (ref 0.2–1.2)
Bilirubin, Direct: 0.1 mg/dL (ref 0.0–0.3)
Total Protein: 5.9 g/dL — ABNORMAL LOW (ref 6.0–8.3)

## 2018-07-01 LAB — BASIC METABOLIC PANEL
BUN: 12 mg/dL (ref 6–23)
CO2: 30 mEq/L (ref 19–32)
CREATININE: 0.91 mg/dL (ref 0.40–1.20)
Calcium: 8.9 mg/dL (ref 8.4–10.5)
Chloride: 107 mEq/L (ref 96–112)
GFR: 64.48 mL/min (ref >60.00)
GLUCOSE: 84 mg/dL (ref 70–99)
POTASSIUM: 3.8 meq/L (ref 3.5–5.1)
Sodium: 143 mEq/L (ref 135–145)

## 2018-07-01 LAB — TSH: TSH: 3.69 u[IU]/mL (ref 0.35–4.50)

## 2018-07-03 ENCOUNTER — Ambulatory Visit: Payer: Medicare Other | Admitting: Internal Medicine

## 2018-07-03 ENCOUNTER — Ambulatory Visit (INDEPENDENT_AMBULATORY_CARE_PROVIDER_SITE_OTHER): Payer: Medicare Other

## 2018-07-03 ENCOUNTER — Encounter: Payer: Self-pay | Admitting: Internal Medicine

## 2018-07-03 VITALS — BP 120/70 | HR 64 | Temp 98.2°F | Resp 18 | Wt 134.8 lb

## 2018-07-03 DIAGNOSIS — M5441 Lumbago with sciatica, right side: Secondary | ICD-10-CM | POA: Diagnosis not present

## 2018-07-03 DIAGNOSIS — Z8673 Personal history of transient ischemic attack (TIA), and cerebral infarction without residual deficits: Secondary | ICD-10-CM

## 2018-07-03 DIAGNOSIS — M25571 Pain in right ankle and joints of right foot: Secondary | ICD-10-CM

## 2018-07-03 DIAGNOSIS — Z23 Encounter for immunization: Secondary | ICD-10-CM | POA: Diagnosis not present

## 2018-07-03 DIAGNOSIS — E7800 Pure hypercholesterolemia, unspecified: Secondary | ICD-10-CM

## 2018-07-03 DIAGNOSIS — K219 Gastro-esophageal reflux disease without esophagitis: Secondary | ICD-10-CM | POA: Diagnosis not present

## 2018-07-03 NOTE — Assessment & Plan Note (Signed)
Saw Dr Sharlet Salina.  S/ epidural.  Doing better.

## 2018-07-03 NOTE — Progress Notes (Signed)
Patient ID: Joan Ritter, female   DOB: [birth year], 72 y.o.   MRN: [id number]   Subjective:    Patient ID: Joan Ritter, female    DOB: [birth year], 72 y.o.   MRN: [id number]  HPI  Patient here for a scheduled follow up.  Was having persistent pain in her right lower back and right leg.  Saw Dr Sharlet Salina.  S/p epidural injection.  Helped.  Has been doing better.   She did step wrong a few days ago.  Had right ankle pain after this occurred.  Did not fall.  No increased swelling.  Did not ice her ankle.  Stays active.  Exercises.  No chest pain.  No sob.  No acid reflux.  No abdominal pain.  Bowels moving.   Overall she feels she is doing well.     Past Medical History:  Diagnosis Date  . Allergic rhinitis    seasonal  . Elevated TSH   . Esophagitis   . GERD (gastroesophageal reflux disease)   . History of Helicobacter pylori infection 2012  . Hypercholesterolemia   . Osteopenia   . TIA (transient ischemic attack) 2001   Past Surgical History:  Procedure Laterality Date  . BREAST BIOPSY Right 2000   neg  . BREAST BIOPSY Right 05/18/2017   neg  . BREAST EXCISIONAL BIOPSY Right 1970   NEG  . COLONOSCOPY  2005   normal   . ESOPHAGOGASTRODUODENOSCOPY  2012   normal   . HEMORRHOID SURGERY  1980s   Family History  Problem Relation Age of Onset  . Lymphoma Father   . CVA Mother        h/o cerebral hemorrhage  . Brain cancer Brother   . Cardiomyopathy Sister        enlarged heart, died age 34 - sudden death  . Breast cancer Maternal Aunt        x2  . Colon cancer Neg Hx    Social History   Socioeconomic History  . Marital status: Married    Spouse name: Not on file  . Number of children: 2  . Years of education: Not on file  . Highest education level: Not on file  Occupational History  . Not on file  Social Needs  . Financial resource strain: Not on file  . Food insecurity:    Worry: Not on file    Inability: Not on file  . Transportation needs:    Medical: Not on  file    Non-medical: Not on file  Tobacco Use  . Smoking status: Never Smoker  . Smokeless tobacco: Never Used  Substance and Sexual Activity  . Alcohol use: No    Alcohol/week: 0.0 standard drinks  . Drug use: No  . Sexual activity: Yes    Birth control/protection: Post-menopausal  Lifestyle  . Physical activity:    Days per week: 4 days    Minutes per session: 60 min  . Stress: Not at all  Relationships  . Social connections:    Talks on phone: Not on file    Gets together: Not on file    Attends religious service: Not on file    Active member of club or organization: Not on file    Attends meetings of clubs or organizations: Not on file    Relationship status: Not on file  Other Topics Concern  . Not on file  Social History Narrative  . Not on file    Outpatient Encounter Medications as of 07/03/2018  Medication Sig  . Acetaminophen (TYLENOL ARTHRITIS PAIN PO) Take by mouth 2 (two) times daily.  Marland Kitchen acyclovir (ZOVIRAX) 800 MG tablet Take as directed.  Marland Kitchen aspirin 81 MG tablet Take 81 mg by mouth daily.  . Calcium Carbonate-Vit D-Min 600-400 MG-UNIT TABS Take 1 tablet by mouth 2 (two) times daily.  . cetirizine (ZYRTEC) 10 MG tablet Take 10 mg by mouth daily as needed.   . clopidogrel (PLAVIX) 75 MG tablet TAKE 1 TABLET BY MOUTH DAILY  . fluticasone (FLONASE) 50 MCG/ACT nasal spray USE 2 SPRAYS EACH NOSTRIL EVERY DAY (Patient taking differently: USE 2 SPRAYS EACH NOSTRIL DAILY AS NEEDED)  . Multiple Vitamin (MULTIVITAMIN) tablet Take 1 tablet by mouth daily.  . pantoprazole (PROTONIX) 40 MG tablet TAKE 1 TABLET BY MOUTH EVERY DAY  . Probiotic Product (PROBIOTIC-10 PO) Take by mouth.  . ranitidine (ZANTAC) 150 MG tablet Take 150 mg by mouth at bedtime.  . simvastatin (ZOCOR) 20 MG tablet Take 1 tablet (20 mg total) by mouth at bedtime.   No facility-administered encounter medications on file as of 07/03/2018.     Review of Systems  Constitutional: Negative for appetite  change and unexpected weight change.  HENT: Negative for congestion and sinus pressure.   Respiratory: Negative for cough, chest tightness and shortness of breath.   Cardiovascular: Negative for chest pain, palpitations and leg swelling.  Gastrointestinal: Negative for abdominal pain, diarrhea, nausea and vomiting.  Genitourinary: Negative for difficulty urinating and dysuria.  Musculoskeletal:       Right ankle pain as outlined.  Hurts if goes from side to side.  Able to walk straight without significant difficulty.  Request xray.    Skin: Negative for color change and rash.  Neurological: Negative for dizziness, light-headedness and headaches.  Psychiatric/Behavioral: Negative for agitation and dysphoric mood.       Objective:    Physical Exam  Constitutional: She appears well-developed and well-nourished. No distress.  HENT:  Nose: Nose normal.  Mouth/Throat: Oropharynx is clear and moist.  Neck: Neck supple. No thyromegaly present.  Cardiovascular: Normal rate and regular rhythm.  Pulmonary/Chest: Breath sounds normal. No respiratory distress. She has no wheezes.  Abdominal: Soft. Bowel sounds are normal. There is no tenderness.  Musculoskeletal: She exhibits no edema.  Increased discomfort with rotation of right ankle.  No increased redness or swelling.   Lymphadenopathy:    She has no cervical adenopathy.  Skin: No rash noted. No erythema.  Psychiatric: She has a normal mood and affect. Her behavior is normal.    BP 120/70 (BP Location: Left Arm, Patient Position: Sitting, Cuff Size: Normal)   Pulse 64   Temp 98.2 F (36.8 C) (Oral)   Resp 18   Wt 134 lb 12.8 oz (61.1 kg)   SpO2 97%   BMI 23.88 kg/m  Wt Readings from Last 3 Encounters:  07/03/18 134 lb 12.8 oz (61.1 kg)  02/19/18 132 lb 6 oz (60 kg)  12/24/17 133 lb (60.3 kg)     Lab Results  Component Value Date   WBC 5.5 07/01/2018   HGB 13.8 07/01/2018   HCT 40.2 07/01/2018   PLT 196.0 07/01/2018    GLUCOSE 84 07/01/2018   CHOL 155 07/01/2018   TRIG 105.0 07/01/2018   HDL 56.80 07/01/2018   LDLCALC 77 07/01/2018   ALT 17 07/01/2018   AST 17 07/01/2018   NA 143 07/01/2018   K 3.8 07/01/2018   CL 107 07/01/2018   CREATININE 0.91 07/01/2018  BUN 12 07/01/2018   CO2 30 07/01/2018   TSH 3.69 07/01/2018    US Carotid Duplex Bilateral  Result Date: 05/22/2018 CLINICAL DATA:  Left neck bruit EXAM: BILATERAL CAROTID DUPLEX ULTRASOUND TECHNIQUE: Pearline Cables scale imaging, color Doppler and duplex ultrasound were performed of bilateral carotid and vertebral arteries in the neck. COMPARISON:  05/18/2011 FINDINGS: Criteria: Quantification of carotid stenosis is based on velocity parameters that correlate the residual internal carotid diameter with NASCET-based stenosis levels, using the diameter of the distal internal carotid lumen as the denominator for stenosis measurement. The following velocity measurements were obtained: RIGHT ICA:  77 cm/sec CCA:  83 cm/sec SYSTOLIC ICA/CCA RATIO:  0.9 DIASTOLIC ICA/CCA RATIO: ECA:  85 cm/sec LEFT ICA:  82 cm/sec CCA:  88 cm/sec SYSTOLIC ICA/CCA RATIO:  0.9 DIASTOLIC ICA/CCA RATIO: ECA:  136 cm/sec RIGHT CAROTID ARTERY: Little if any plaque in the bulb. Low resistance internal carotid Doppler pattern is preserved. RIGHT VERTEBRAL ARTERY:  Antegrade. LEFT CAROTID ARTERY: Little if any plaque in the bulb. Low resistance internal carotid Doppler pattern is preserved. LEFT VERTEBRAL ARTERY:  Antegrade. IMPRESSION: Less than 50% stenosis in the right and left internal carotid arteries. Electronically Signed   By: Marybelle Killings M.D.   On: 05/22/2018 13:41       Assessment & Plan:   Problem List Items Addressed This Visit    Back pain    Saw Dr Sharlet Salina.  S/ epidural.  Doing better.        GERD (gastroesophageal reflux disease)    Controlled.        History of TIA (transient ischemic attack)    Doing well on plavix.        Hypercholesterolemia    On simvastatin.   Low cholesterol diet and exercise.  Follow lipid panel and liver function tests.        Relevant Orders   Hepatic function panel   Lipid panel   Basic metabolic panel    Other Visit Diagnoses    Acute right ankle pain    -  Primary   Persistent s/p injury.  Check xray.  Discussed laced ankle support.     Relevant Orders   DG Ankle 2 Views Right (Completed)   Need for influenza vaccination       Relevant Orders   Flu vaccine HIGH DOSE PF (Fluzone High dose) (Completed)       Einar Pheasant, MD

## 2018-07-06 ENCOUNTER — Encounter: Payer: Self-pay | Admitting: Internal Medicine

## 2018-07-06 NOTE — Assessment & Plan Note (Signed)
Controlled.  

## 2018-07-06 NOTE — Assessment & Plan Note (Signed)
On simvastatin.  Low cholesterol diet and exercise.  Follow lipid panel and liver function tests.   

## 2018-07-06 NOTE — Assessment & Plan Note (Signed)
Doing well on plavix.  

## 2018-07-08 ENCOUNTER — Other Ambulatory Visit: Payer: Self-pay | Admitting: Internal Medicine

## 2018-10-27 ENCOUNTER — Other Ambulatory Visit: Payer: Self-pay | Admitting: Internal Medicine

## 2018-11-13 IMAGING — US US CAROTID DUPLEX BILAT
1 series · 13 of 24 positions shown · non-contrast
Comparison: 05/18/2011

CLINICAL DATA: Left neck bruit

EXAM:
BILATERAL CAROTID DUPLEX ULTRASOUND
TECHNIQUE: Gray scale imaging, color Doppler and duplex ultrasound were
performed of bilateral carotid and vertebral arteries in the neck.

[Series 1: us carotid duplex bilat · 13 of 66 slices shown]
[im 1/66]
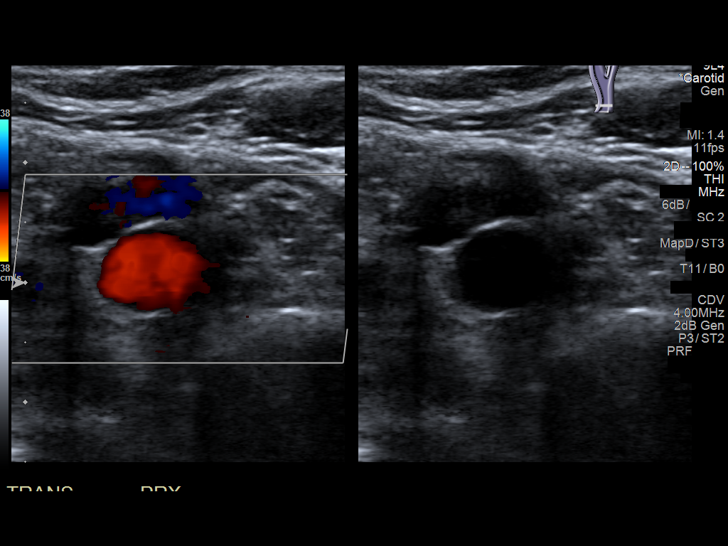
[im 6/66]
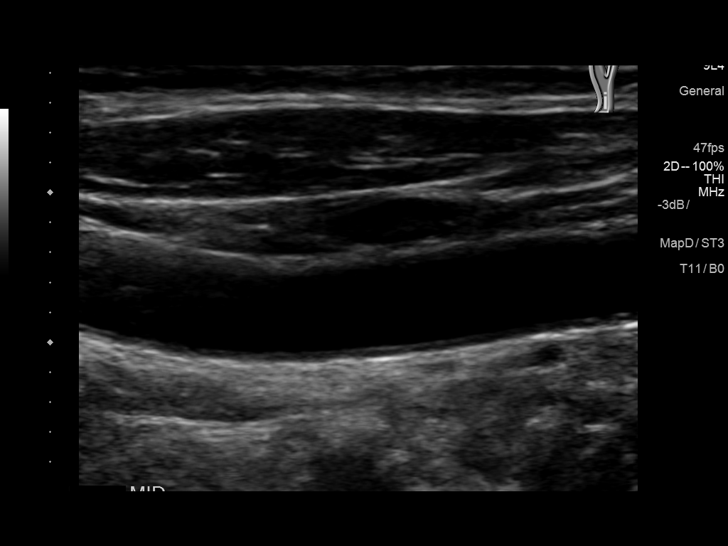
[im 12/66]
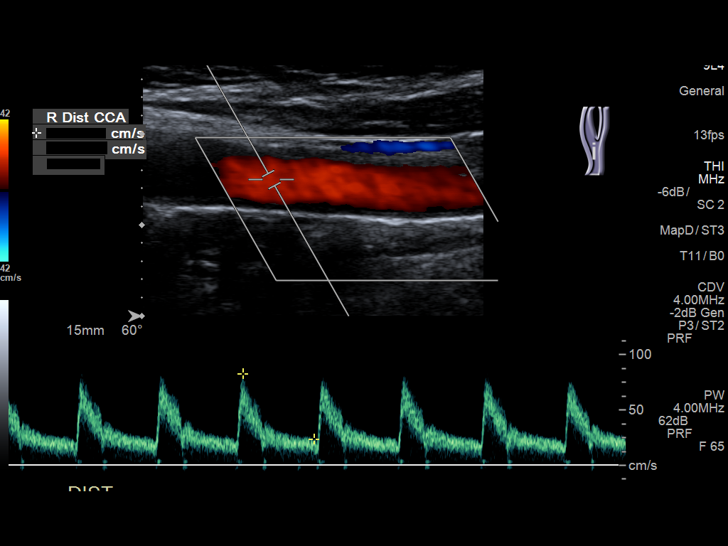
[im 17/66]
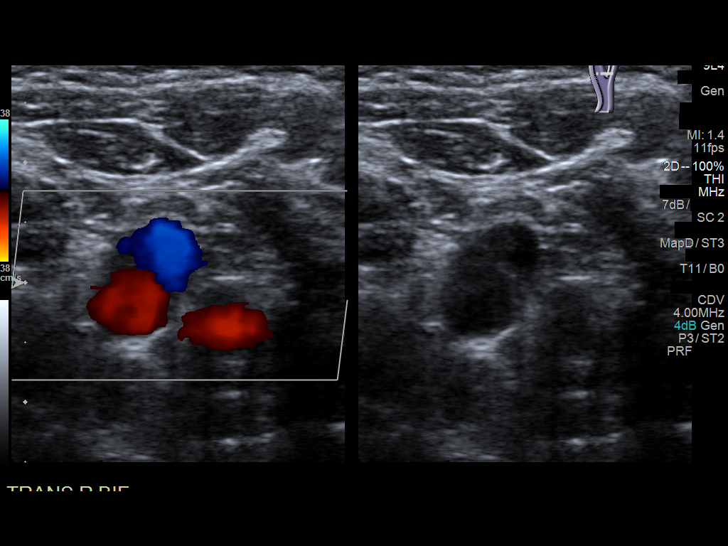
[im 23/66]
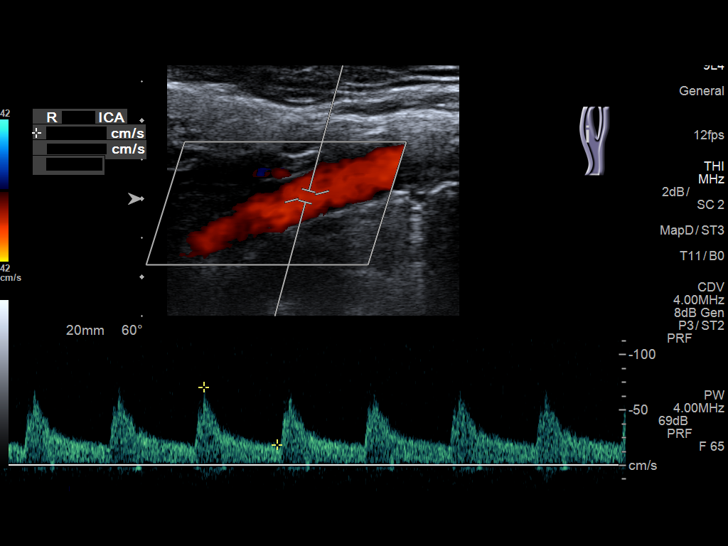
[im 29/66]
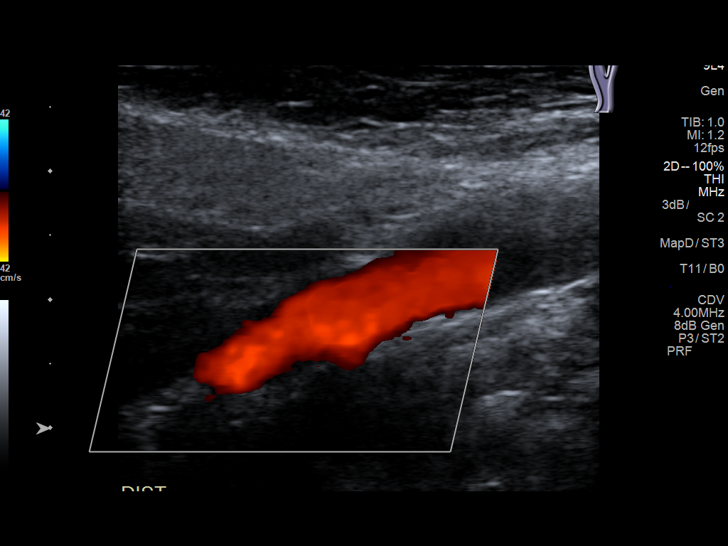
[im 34/66]
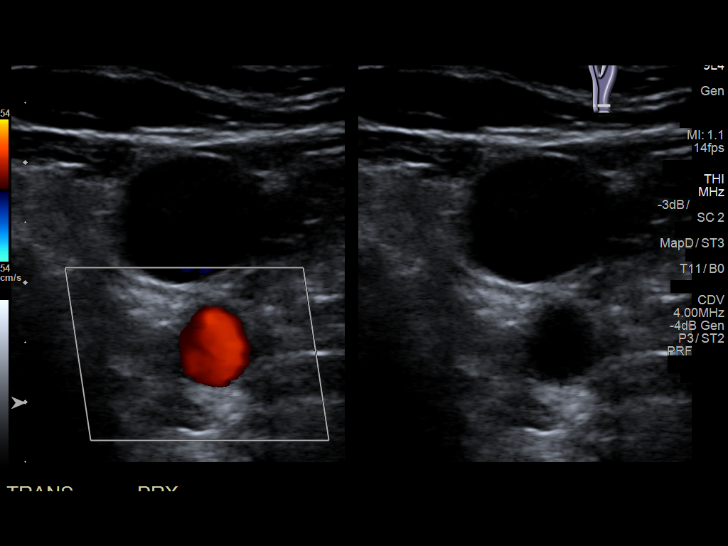
[im 37/66]
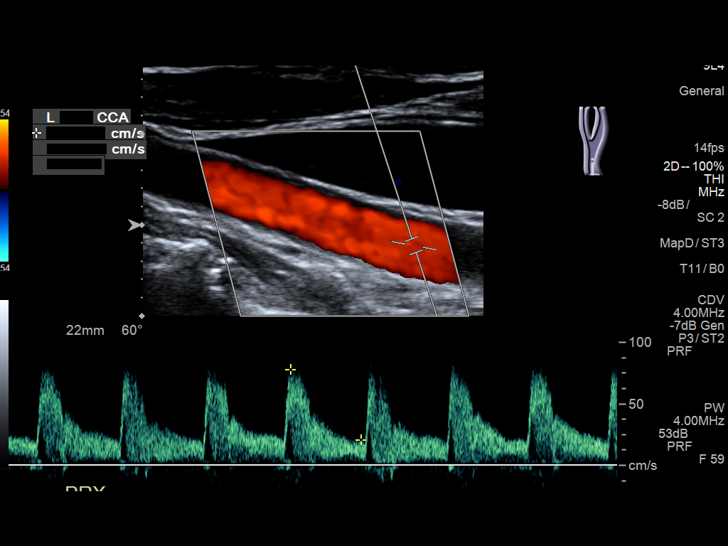
[im 43/66]
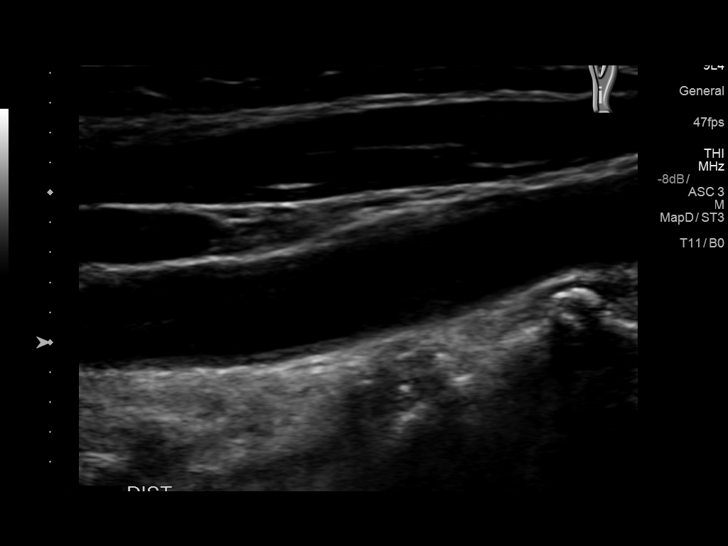
[im 49/66]
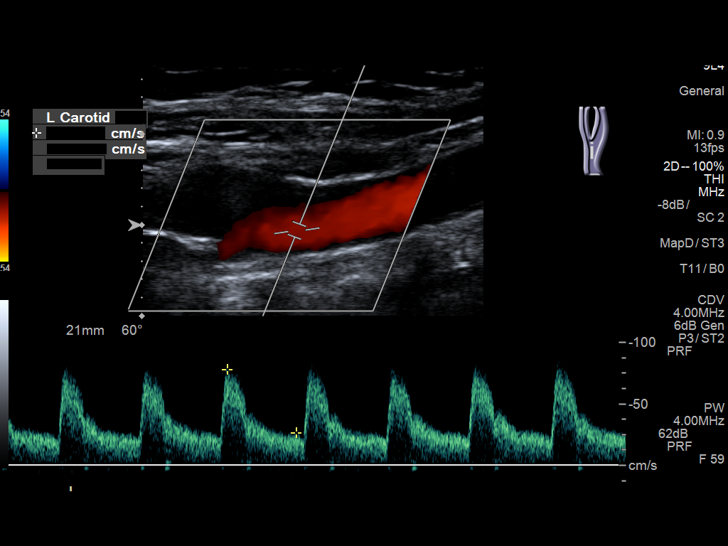
[im 54/66]
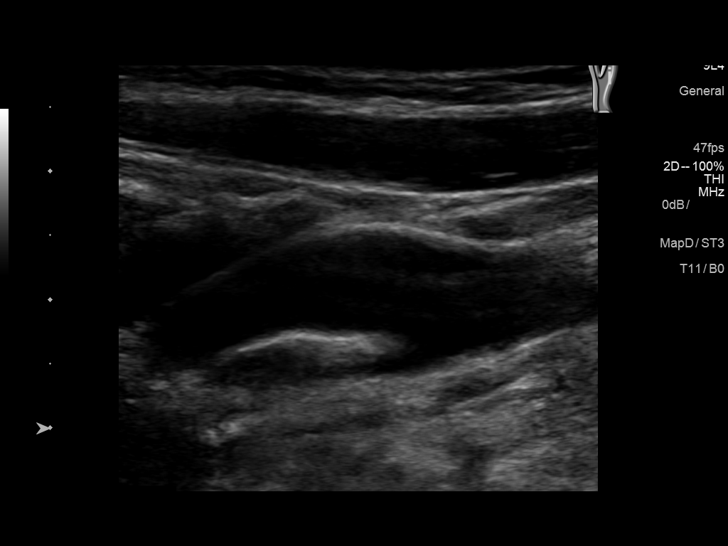
[im 60/66]
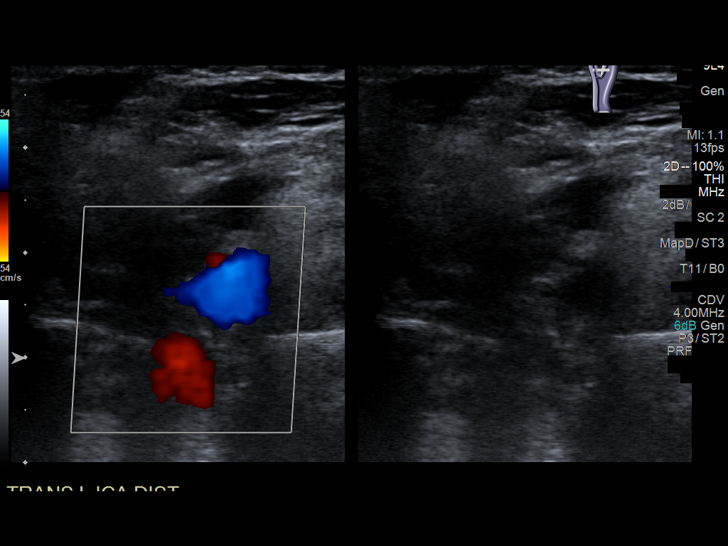
[im 66/66]
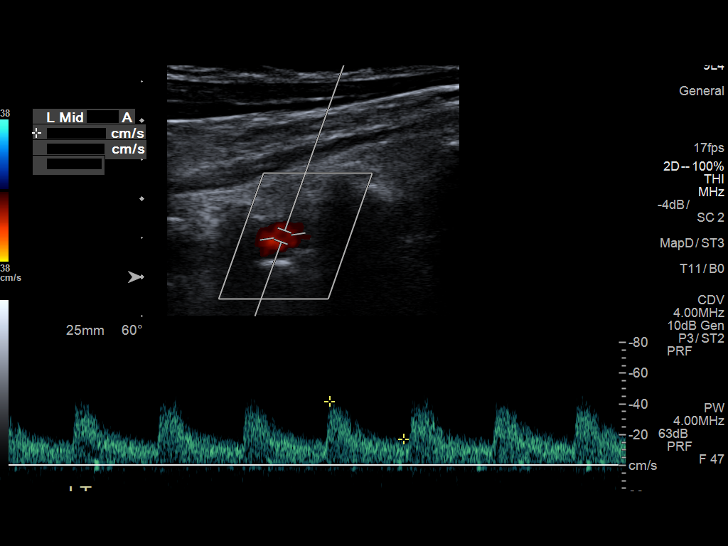

[13 of 24 positions shown; findings below may reference images not displayed]

FINDINGS: Criteria: Quantification of carotid stenosis is based on velocity
parameters that correlate the residual internal carotid diameter
with NASCET-based stenosis levels, using the diameter of the distal
internal carotid lumen as the denominator for stenosis measurement.

The following velocity measurements were obtained:

RIGHT

ICA:  77 cm/sec

CCA:  83 cm/sec

SYSTOLIC ICA/CCA RATIO:

DIASTOLIC ICA/CCA RATIO:

ECA:  85 cm/sec

LEFT

ICA:  82 cm/sec

CCA:  88 cm/sec

SYSTOLIC ICA/CCA RATIO:

DIASTOLIC ICA/CCA RATIO:

ECA:  136 cm/sec

RIGHT CAROTID ARTERY: Little if any plaque in the bulb. Low
resistance internal carotid Doppler pattern is preserved.

RIGHT VERTEBRAL ARTERY:  Antegrade.

LEFT CAROTID ARTERY: Little if any plaque in the bulb. Low
resistance internal carotid Doppler pattern is preserved.

LEFT VERTEBRAL ARTERY:  Antegrade.
IMPRESSION: Less than 50% stenosis in the right and left internal carotid
arteries.

## 2018-12-31 ENCOUNTER — Other Ambulatory Visit (INDEPENDENT_AMBULATORY_CARE_PROVIDER_SITE_OTHER): Payer: Medicare Other

## 2018-12-31 DIAGNOSIS — E7800 Pure hypercholesterolemia, unspecified: Secondary | ICD-10-CM

## 2018-12-31 LAB — BASIC METABOLIC PANEL
BUN: 15 mg/dL (ref 6–23)
CHLORIDE: 105 meq/L (ref 96–112)
CO2: 31 mEq/L (ref 19–32)
Calcium: 9.3 mg/dL (ref 8.4–10.5)
Creatinine, Ser: 0.88 mg/dL (ref 0.40–1.20)
GFR: 62.97 mL/min (ref >60.00)
GLUCOSE: 81 mg/dL (ref 70–99)
POTASSIUM: 4.3 meq/L (ref 3.5–5.1)
Sodium: 143 mEq/L (ref 135–145)

## 2018-12-31 LAB — HEPATIC FUNCTION PANEL
ALBUMIN: 4 g/dL (ref 3.5–5.2)
ALT: 20 U/L (ref 0–35)
AST: 20 U/L (ref 0–37)
Alkaline Phosphatase: 52 U/L (ref 39–117)
Bilirubin, Direct: 0.1 mg/dL (ref 0.0–0.3)
Total Bilirubin: 0.7 mg/dL (ref 0.2–1.2)
Total Protein: 5.9 g/dL — ABNORMAL LOW (ref 6.0–8.3)

## 2018-12-31 LAB — LIPID PANEL
CHOLESTEROL: 179 mg/dL (ref 0–200)
HDL: 61.1 mg/dL (ref >39.00)
LDL Cholesterol: 98 mg/dL (ref 0–99)
NonHDL: 118.24
Total CHOL/HDL Ratio: 3
Triglycerides: 101 mg/dL (ref 0.0–149.0)
VLDL: 20.2 mg/dL (ref 0.0–40.0)

## 2019-01-01 ENCOUNTER — Encounter: Payer: Self-pay | Admitting: Internal Medicine

## 2019-01-07 ENCOUNTER — Encounter: Payer: Self-pay | Admitting: Internal Medicine

## 2019-01-07 ENCOUNTER — Ambulatory Visit (INDEPENDENT_AMBULATORY_CARE_PROVIDER_SITE_OTHER): Payer: Medicare Other | Admitting: Internal Medicine

## 2019-01-07 ENCOUNTER — Other Ambulatory Visit: Payer: Self-pay

## 2019-01-07 VITALS — BP 106/68 | HR 65 | Temp 98.1°F | Resp 16 | Ht 63.0 in | Wt 129.8 lb

## 2019-01-07 DIAGNOSIS — K219 Gastro-esophageal reflux disease without esophagitis: Secondary | ICD-10-CM | POA: Diagnosis not present

## 2019-01-07 DIAGNOSIS — M25561 Pain in right knee: Secondary | ICD-10-CM

## 2019-01-07 DIAGNOSIS — Z0000 Encounter for general adult medical examination without abnormal findings: Secondary | ICD-10-CM

## 2019-01-07 DIAGNOSIS — M5441 Lumbago with sciatica, right side: Secondary | ICD-10-CM

## 2019-01-07 DIAGNOSIS — Z1231 Encounter for screening mammogram for malignant neoplasm of breast: Secondary | ICD-10-CM | POA: Diagnosis not present

## 2019-01-07 DIAGNOSIS — E7800 Pure hypercholesterolemia, unspecified: Secondary | ICD-10-CM

## 2019-01-07 DIAGNOSIS — Z8673 Personal history of transient ischemic attack (TIA), and cerebral infarction without residual deficits: Secondary | ICD-10-CM

## 2019-01-07 NOTE — Progress Notes (Signed)
Patient ID: Joan Ritter, female   DOB: 1946/05/05, 73 y.o.   MRN: [id number]   Subjective:    Patient ID: Joan Ritter, female    DOB: 11/02/1945, 73 y.o.   MRN: [id number]  HPI  Patient here for her physical exam.  Seeing Dr Sharlet Salina for her back.  Was evaluated 12/09/18.  Placed on tramadol.  Not taking.  Still with pain.  Discussed f/u with Dr Sharlet Salina.  No chest pain.  No sob.  No acid reflux.  No abdominal pain.  Bowels moving.  No urine change.  Still with right hip/calf pain.  Also aching in her right knee.  Does not limit her activity.  Staying active.     Past Medical History:  Diagnosis Date   Allergic rhinitis    seasonal   Elevated TSH    Esophagitis    GERD (gastroesophageal reflux disease)    History of Helicobacter pylori infection 2012   Hypercholesterolemia    Osteopenia    TIA (transient ischemic attack) 2001   Past Surgical History:  Procedure Laterality Date   BREAST BIOPSY Right 2000   neg   BREAST BIOPSY Right 05/18/2017   neg   BREAST EXCISIONAL BIOPSY Right 1970   NEG   COLONOSCOPY  2005   normal    ESOPHAGOGASTRODUODENOSCOPY  2012   normal    HEMORRHOID SURGERY  1980s   Family History  Problem Relation Age of Onset   Lymphoma Father    CVA Mother        h/o cerebral hemorrhage   Brain cancer Brother    Cardiomyopathy Sister        enlarged heart, died age 15 - sudden death   Breast cancer Maternal Aunt        x2   Colon cancer Neg Hx    Social History   Socioeconomic History   Marital status: Married    Spouse name: Not on file   Number of children: 2   Years of education: Not on file   Highest education level: Not on file  Occupational History   Not on file  Social Needs   Financial resource strain: Not on file   Food insecurity:    Worry: Not on file    Inability: Not on file   Transportation needs:    Medical: Not on file    Non-medical: Not on file  Tobacco Use   Smoking status: Never Smoker    Smokeless tobacco: Never Used  Substance and Sexual Activity   Alcohol use: No    Alcohol/week: 0.0 standard drinks   Drug use: No   Sexual activity: Yes    Birth control/protection: Post-menopausal  Lifestyle   Physical activity:    Days per week: 4 days    Minutes per session: 60 min   Stress: Not at all  Relationships   Social connections:    Talks on phone: Not on file    Gets together: Not on file    Attends religious service: Not on file    Active member of club or organization: Not on file    Attends meetings of clubs or organizations: Not on file    Relationship status: Not on file  Other Topics Concern   Not on file  Social History Narrative   Not on file    Outpatient Encounter Medications as of 01/07/2019  Medication Sig   Acetaminophen (TYLENOL ARTHRITIS PAIN PO) Take by mouth 2 (two) times daily.   acyclovir (ZOVIRAX)  800 MG tablet TAKE AS DIRECTED   aspirin 81 MG tablet Take 81 mg by mouth daily.   Calcium Carbonate-Vit D-Min 600-400 MG-UNIT TABS Take 1 tablet by mouth 2 (two) times daily.   cetirizine (ZYRTEC) 10 MG tablet Take 10 mg by mouth daily as needed.    clopidogrel (PLAVIX) 75 MG tablet TAKE 1 TABLET BY MOUTH DAILY   fluticasone (FLONASE) 50 MCG/ACT nasal spray USE 2 SPRAYS EACH NOSTRIL EVERY DAY (Patient taking differently: USE 2 SPRAYS EACH NOSTRIL DAILY AS NEEDED)   Multiple Vitamin (MULTIVITAMIN) tablet Take 1 tablet by mouth daily.   pantoprazole (PROTONIX) 40 MG tablet TAKE 1 TABLET BY MOUTH EVERY DAY   Probiotic Product (PROBIOTIC-10 PO) Take by mouth.   ranitidine (ZANTAC) 150 MG tablet Take 150 mg by mouth at bedtime.   simvastatin (ZOCOR) 20 MG tablet Take 1 tablet (20 mg total) by mouth at bedtime.   traMADol (ULTRAM) 50 MG tablet 1/2 TO 1 TABLET BY MOUTH TWICE DAILY AS NEEDED   No facility-administered encounter medications on file as of 01/07/2019.     Review of Systems  Constitutional: Negative for appetite change  and unexpected weight change.  HENT: Negative for congestion and sinus pressure.   Eyes: Negative for pain and visual disturbance.  Respiratory: Negative for cough, chest tightness and shortness of breath.   Cardiovascular: Negative for chest pain, palpitations and leg swelling.  Gastrointestinal: Negative for abdominal pain, diarrhea, nausea and vomiting.  Genitourinary: Negative for difficulty urinating and dysuria.  Musculoskeletal: Negative for myalgias.       Right hip and calf pain as outlined.  Right knee aching.    Skin: Negative for color change and rash.  Neurological: Negative for dizziness, light-headedness and headaches.  Hematological: Negative for adenopathy. Does not bruise/bleed easily.  Psychiatric/Behavioral: Negative for dysphoric mood.       Objective:    Physical Exam Constitutional:      General: She is not in acute distress.    Appearance: Normal appearance. She is well-developed.  HENT:     Nose: Nose normal. No congestion.     Mouth/Throat:     Pharynx: No oropharyngeal exudate or posterior oropharyngeal erythema.  Eyes:     General: No scleral icterus.       Right eye: No discharge.        Left eye: No discharge.  Neck:     Musculoskeletal: Neck supple. No muscular tenderness.     Thyroid: No thyromegaly.  Cardiovascular:     Rate and Rhythm: Normal rate and regular rhythm.  Pulmonary:     Effort: No tachypnea, accessory muscle usage or respiratory distress.     Breath sounds: Normal breath sounds. No decreased breath sounds or wheezing.  Chest:     Breasts:        Right: No inverted nipple, mass, nipple discharge or tenderness (no axillary adenopathy).        Left: No inverted nipple, mass, nipple discharge or tenderness (no axilarry adenopathy).  Abdominal:     General: Bowel sounds are normal.     Palpations: Abdomen is soft.     Tenderness: There is no abdominal tenderness.  Musculoskeletal:        General: No swelling or tenderness.      Comments: No increased swelling of her knee.  No instability of knee joint.    Lymphadenopathy:     Cervical: No cervical adenopathy.  Skin:    Findings: No erythema or rash.  Neurological:  Mental Status: She is alert and oriented to person, place, and time.  Psychiatric:        Behavior: Behavior normal.     BP 106/68    Pulse 65    Temp 98.1 F (36.7 C) (Oral)    Resp 16    Ht 5\' 3"  (1.6 m)    Wt 129 lb 12.8 oz (58.9 kg)    SpO2 98%    BMI 22.99 kg/m  Wt Readings from Last 3 Encounters:  01/10/19 130 lb (59 kg)  01/07/19 129 lb 12.8 oz (58.9 kg)  07/03/18 134 lb 12.8 oz (61.1 kg)     Lab Results  Component Value Date   WBC 12.0 (H) 01/10/2019   HGB 14.1 01/10/2019   HCT 40.3 01/10/2019   PLT 207 01/10/2019   GLUCOSE 127 (H) 01/10/2019   CHOL 179 12/31/2018   TRIG 101.0 12/31/2018   HDL 61.10 12/31/2018   LDLCALC 98 12/31/2018   ALT 20 12/31/2018   AST 20 12/31/2018   NA 139 01/10/2019   K 3.4 (L) 01/10/2019   CL 105 01/10/2019   CREATININE 0.72 01/10/2019   BUN 17 01/10/2019   CO2 26 01/10/2019   TSH 3.69 07/01/2018    US Carotid Duplex Bilateral  Result Date: 05/22/2018 CLINICAL DATA:  Left neck bruit EXAM: BILATERAL CAROTID DUPLEX ULTRASOUND TECHNIQUE: Pearline Cables scale imaging, color Doppler and duplex ultrasound were performed of bilateral carotid and vertebral arteries in the neck. COMPARISON:  05/18/2011 FINDINGS: Criteria: Quantification of carotid stenosis is based on velocity parameters that correlate the residual internal carotid diameter with NASCET-based stenosis levels, using the diameter of the distal internal carotid lumen as the denominator for stenosis measurement. The following velocity measurements were obtained: RIGHT ICA:  77 cm/sec CCA:  83 cm/sec SYSTOLIC ICA/CCA RATIO:  0.9 DIASTOLIC ICA/CCA RATIO: ECA:  85 cm/sec LEFT ICA:  82 cm/sec CCA:  88 cm/sec SYSTOLIC ICA/CCA RATIO:  0.9 DIASTOLIC ICA/CCA RATIO: ECA:  136 cm/sec RIGHT CAROTID ARTERY: Little  if any plaque in the bulb. Low resistance internal carotid Doppler pattern is preserved. RIGHT VERTEBRAL ARTERY:  Antegrade. LEFT CAROTID ARTERY: Little if any plaque in the bulb. Low resistance internal carotid Doppler pattern is preserved. LEFT VERTEBRAL ARTERY:  Antegrade. IMPRESSION: Less than 50% stenosis in the right and left internal carotid arteries. Electronically Signed   By: Marybelle Killings M.D.   On: 05/22/2018 13:41       Assessment & Plan:   Problem List Items Addressed This Visit    Back pain    Persistent pain.  Followed by Dr Sharlet Salina.        Relevant Medications   traMADol (ULTRAM) 50 MG tablet   GERD (gastroesophageal reflux disease)    Controlled.        Health care maintenance    Physical today 01/07/19.  Mammogram 12/11/17 - birads I.  Schedule f/u mammogrm.  Colonoscopy 07/2014 - normal.        History of TIA (transient ischemic attack)    Doing well on plavix.        Hypercholesterolemia    On simvastatin.  Low cholesterol diet and exercise.  Follow lipid panel and liver function tests.        Relevant Orders   CBC with Differential/Platelet   Hepatic function panel   Lipid panel   TSH   Basic metabolic panel   Right knee pain    Right knee pain.  Due to f/u with Dr Sharlet Salina regarding  her back.  Will notify me if desires further w/up regarding her knee.         Other Visit Diagnoses    Routine general medical examination at a health care facility    -  Primary   Visit for screening mammogram       Relevant Orders   MM 3D SCREEN BREAST BILATERAL       Einar Pheasant, MD

## 2019-01-07 NOTE — Assessment & Plan Note (Signed)
Physical today 01/07/19.  Mammogram 12/11/17 - birads I.  Schedule f/u mammogrm.  Colonoscopy 07/2014 - normal.

## 2019-01-10 ENCOUNTER — Other Ambulatory Visit: Payer: Self-pay

## 2019-01-10 ENCOUNTER — Encounter: Payer: Self-pay | Admitting: Emergency Medicine

## 2019-01-10 ENCOUNTER — Emergency Department
Admission: EM | Admit: 2019-01-10 | Discharge: 2019-01-10 | Disposition: A | Discharge status: Home / Self Care | Payer: Medicare Other | Attending: Emergency Medicine | Admitting: Emergency Medicine

## 2019-01-10 ENCOUNTER — Encounter: Payer: Self-pay | Admitting: Internal Medicine

## 2019-01-10 ENCOUNTER — Emergency Department: Payer: Medicare Other

## 2019-01-10 DIAGNOSIS — W010XXA Fall on same level from slipping, tripping and stumbling without subsequent striking against object, initial encounter: Secondary | ICD-10-CM | POA: Diagnosis not present

## 2019-01-10 DIAGNOSIS — Z79899 Other long term (current) drug therapy: Secondary | ICD-10-CM | POA: Insufficient documentation

## 2019-01-10 DIAGNOSIS — S0990XA Unspecified injury of head, initial encounter: Secondary | ICD-10-CM | POA: Diagnosis present

## 2019-01-10 DIAGNOSIS — Y9301 Activity, walking, marching and hiking: Secondary | ICD-10-CM | POA: Diagnosis not present

## 2019-01-10 DIAGNOSIS — Y999 Unspecified external cause status: Secondary | ICD-10-CM | POA: Insufficient documentation

## 2019-01-10 DIAGNOSIS — R55 Syncope and collapse: Secondary | ICD-10-CM | POA: Insufficient documentation

## 2019-01-10 DIAGNOSIS — Z7982 Long term (current) use of aspirin: Secondary | ICD-10-CM | POA: Insufficient documentation

## 2019-01-10 DIAGNOSIS — Y929 Unspecified place or not applicable: Secondary | ICD-10-CM | POA: Insufficient documentation

## 2019-01-10 LAB — URINALYSIS, COMPLETE (UACMP) WITH MICROSCOPIC
BILIRUBIN URINE: NEGATIVE
Bacteria, UA: NONE SEEN
Glucose, UA: NEGATIVE mg/dL
Ketones, ur: NEGATIVE mg/dL
Leukocytes,Ua: NEGATIVE
Nitrite: NEGATIVE
Protein, ur: NEGATIVE mg/dL
Specific Gravity, Urine: 1.03 (ref 1.005–1.030)
pH: 5 (ref 5.0–8.0)

## 2019-01-10 LAB — CBC
HCT: 40.3 % (ref 36.0–46.0)
Hemoglobin: 14.1 g/dL (ref 12.0–15.0)
MCH: 32.2 pg (ref 26.0–34.0)
MCHC: 35 g/dL (ref 30.0–36.0)
MCV: 92 fL (ref 80.0–100.0)
Platelets: 207 10*3/uL (ref 150–400)
RBC: 4.38 MIL/uL (ref 3.87–5.11)
RDW: 11.7 % (ref 11.5–15.5)
WBC: 12 10*3/uL — ABNORMAL HIGH (ref 4.0–10.5)
nRBC: 0 % (ref 0.0–0.2)

## 2019-01-10 LAB — BASIC METABOLIC PANEL
Anion gap: 8 (ref 5–15)
BUN: 17 mg/dL (ref 8–23)
CO2: 26 mmol/L (ref 22–32)
Calcium: 9.2 mg/dL (ref 8.9–10.3)
Chloride: 105 mmol/L (ref 98–111)
Creatinine, Ser: 0.72 mg/dL (ref 0.44–1.00)
GFR calc Af Amer: >60 mL/min (ref >60)
GFR calc non Af Amer: >60 mL/min (ref >60)
Glucose, Bld: 127 mg/dL — ABNORMAL HIGH (ref 70–99)
Potassium: 3.4 mmol/L — ABNORMAL LOW (ref 3.5–5.1)
Sodium: 139 mmol/L (ref 135–145)

## 2019-01-10 LAB — TROPONIN I: Troponin I: <0.03 ng/mL (ref <0.03)

## 2019-01-10 NOTE — ED Provider Notes (Addendum)
[facility] Emergency Department Provider Note  ____________________________________________   I have reviewed the triage vital signs and the nursing notes.   HISTORY  Chief Complaint Fall and Loss of Consciousness   History limited by: Not Limited   HPI Joan Ritter is a 73 y.o. female who presents to the emergency department today because of concern for a syncopal episode. It occurred this morning when the patient was getting out of bed. She states she started feeling lightheaded and then passed out. Her husband was there so she did not fall. The patient denies any chest pain or palpitations when this occurred. She did have a fall yesterday. States that she tripped on a tree branch that she had not seen. The patient did hit her head. Had some pain in her shoulders overnight so took a tramadol around 3 am. The patient is on blood thinners.    Records reviewed. Per medical record review patient has a history of taking plavix.   Past Medical History:  Diagnosis Date  . Allergic rhinitis    seasonal  . Elevated TSH   . Esophagitis   . GERD (gastroesophageal reflux disease)   . History of Helicobacter pylori infection 2012  . Hypercholesterolemia   . Osteopenia   . TIA (transient ischemic attack) 2001    Patient Active Problem List   Diagnosis Date Noted  . Left carotid bruit 02/24/2018  . Suprapubic pressure 11/16/2017  . Back pain 05/09/2017  . Breast nodule 05/09/2017  . Knee pain, bilateral 04/20/2015  . Health care maintenance 04/20/2015  . Medicare annual wellness visit, subsequent 10/09/2014  . Encounter for screening colonoscopy 07/07/2014  . Pre-op evaluation 09/07/2013  . History of TIA (transient ischemic attack) 09/08/2012  . GERD (gastroesophageal reflux disease) 09/08/2012  . Hypercholesterolemia 09/08/2012  . Osteopenia 09/08/2012  . 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 BREAST  BIOPSY Right 2000   neg  . BREAST BIOPSY Right 05/18/2017   neg  . BREAST EXCISIONAL BIOPSY Right 1970   NEG  . COLONOSCOPY  2005   normal   . ESOPHAGOGASTRODUODENOSCOPY  2012   normal   . HEMORRHOID SURGERY  1980s    Prior to Admission medications   Medication Sig Start Date End Date Taking? Authorizing Provider  Acetaminophen (TYLENOL ARTHRITIS PAIN PO) Take by mouth 2 (two) times daily.    [provider]  acyclovir (ZOVIRAX) 800 MG tablet TAKE AS DIRECTED 07/09/18   Einar Pheasant, MD  aspirin 81 MG tablet Take 81 mg by mouth daily.    [provider]  Calcium Carbonate-Vit D-Min 600-400 MG-UNIT TABS Take 1 tablet by mouth 2 (two) times daily.    [provider]  cetirizine (ZYRTEC) 10 MG tablet Take 10 mg by mouth daily as needed.     [provider]  clopidogrel (PLAVIX) 75 MG tablet TAKE 1 TABLET BY MOUTH DAILY 05/27/18   Einar Pheasant, MD  fluticasone (FLONASE) 50 MCG/ACT nasal spray USE 2 SPRAYS EACH NOSTRIL EVERY DAY Patient taking differently: USE 2 SPRAYS EACH NOSTRIL DAILY AS NEEDED 11/01/12   Einar Pheasant, MD  Multiple Vitamin (MULTIVITAMIN) tablet Take 1 tablet by mouth daily.    [provider]  pantoprazole (PROTONIX) 40 MG tablet TAKE 1 TABLET BY MOUTH EVERY DAY 10/29/18   Einar Pheasant, MD  Probiotic Product (PROBIOTIC-10 PO) Take by mouth.    [provider]  ranitidine (ZANTAC) 150 MG tablet Take 150 mg  by mouth at bedtime.    [provider]  simvastatin (ZOCOR) 20 MG tablet Take 1 tablet (20 mg total) by mouth at bedtime. 02/19/18   Einar Pheasant, MD  traMADol (ULTRAM) 50 MG tablet 1/2 TO 1 TABLET BY MOUTH TWICE DAILY AS NEEDED 11/29/18   [provider]    Allergies No known drug allergy  Family History  Problem Relation Age of Onset  . Lymphoma Father   . CVA Mother        h/o cerebral hemorrhage  . Brain cancer Brother   . Cardiomyopathy Sister        enlarged heart, died age 46  - sudden death  . Breast cancer Maternal Aunt        x2  . Colon cancer Neg Hx     Social History Social History   Tobacco Use  . Smoking status: Never Smoker  . Smokeless tobacco: Never Used  Substance Use Topics  . Alcohol use: No    Alcohol/week: 0.0 standard drinks  . Drug use: No    Review of Systems Constitutional: No fever/chills Eyes: No visual changes. ENT: No sore throat. Cardiovascular: Denies chest pain. Respiratory: Denies shortness of breath. Gastrointestinal: No abdominal pain.  No nausea, no vomiting.  No diarrhea.   Genitourinary: Negative for dysuria. Musculoskeletal: Positive for shoulder pain. Skin: Positive for bruising.  Neurological: Positive for headache. Positive for syncopal episode ____________________________________________   PHYSICAL EXAM:  VITAL SIGNS: ED Triage Vitals  Enc Vitals Group     BP 01/10/19 1113 121/66     Pulse Rate 01/10/19 1113 65     Resp 01/10/19 1113 18     Temp 01/10/19 1113 98.6 F (37 C)     Temp Source 01/10/19 1113 Oral     SpO2 01/10/19 1113 98 %     Weight 01/10/19 1114 130 lb (59 kg)     Height 01/10/19 1114 5\' 3"  (1.6 m)     Head Circumference --      Peak Flow --      Pain Score 01/10/19 1114 5     Pain Loc --      Pain Edu? --      Excl. in [place]? --      Constitutional: Alert and oriented.  Eyes: Conjunctivae are normal.  ENT      Head: Normocephalic. Bruising noted to bilateral cheeks.       Nose: No congestion/rhinnorhea.      Mouth/Throat: Mucous membranes are moist.      Neck: No stridor. Hematological/Lymphatic/Immunilogical: No cervical lymphadenopathy. Cardiovascular: Normal rate, regular rhythm.  No murmurs, rubs, or gallops. Respiratory: Normal respiratory effort without tachypnea nor retractions. Breath sounds are clear and equal bilaterally. No wheezes/rales/rhonchi. Gastrointestinal: Soft and non tender. No rebound. No guarding.  Genitourinary: Deferred Musculoskeletal: Normal  range of motion in all extremities. No lower extremity edema. Neurologic:  Normal speech and language. No gross focal neurologic deficits are appreciated.  Skin:  Skin is warm, dry and intact. No rash noted. Psychiatric: Mood and affect are normal. Speech and behavior are normal. Patient exhibits appropriate insight and judgment.  ____________________________________________    LABS (pertinent positives/negatives)  BMP wnl except k 3.4, glu 127 CBC wbc 12.0, hgb 14.1, plt 207 UA moderate hgb dipstick, 11-20 rbc, 6-10 wbc, bacteria none seen  ____________________________________________   EKG  I, Nance Pear, attending physician, personally viewed and interpreted this EKG  EKG Time: 1121 Rate: 79 Rhythm: sinus rhythm Axis: normal Intervals:  qtc 417 QRS: narrow, q waves v1 ST changes: no st elevation, st depression, III, aVF, V3, V4 Impression: abnormal ekg  I, Nance Pear, attending physician, personally viewed and interpreted this EKG  EKG Time: 1423 Rate: 58 Rhythm: sinus rhythm Axis: normal Intervals: qtc 370 QRS: narrow ST changes: no st elevation st depression III, avf, v3, v4 Impression: abnormal ekg, no significant change from prior  ____________________________________________    RADIOLOGY  CT head/max face/cervical spine No fracture. No intracranial bleed.   ____________________________________________   PROCEDURES  Procedures  ____________________________________________   INITIAL IMPRESSION / ASSESSMENT AND PLAN / ED COURSE  Pertinent labs & imaging results that were available during my care of the patient were reviewed by me and considered in my medical decision making (see chart for details).   Patient presented to the emergency department today because of concerns for syncopal episode.  Patient did have a fall yesterday with some head trauma.  She is on blood thinners.  CT head however was negative.  The patient did have 2 troponins  that were negative here.  Possible or without any concerning anemia or electrolyte abnormalities.  Patient without any concerning arrhythmias on monitor. At this point will plan on discharging home to follow up with primary care provider.   ____________________________________________   FINAL CLINICAL IMPRESSION(S) / ED DIAGNOSES  Final diagnoses:  Syncope, unspecified syncope type     Note: This dictation was prepared with Dragon dictation. Any transcriptional errors that result from this process are unintentional     Nance Pear, MD 01/10/19 [id number]    Nance Pear, MD 01/10/19 [phone]

## 2019-01-10 NOTE — ED Notes (Signed)
Pt verbalized understanding of d/c instructions, and f/u care. No further questions at this time. E-signature not responding at the time of d/c. Pt ambulatory to the exit with steady gait

## 2019-01-10 NOTE — ED Triage Notes (Signed)
Patient presents to the ED post fall yesterday.  Patient has bruising under both eyes.  Patient denies passing out at time of fall.  Patient takes plavix.  Patient had a syncopal episode today around 8:30am and husband caught her and laid her on the floor.  Husband states patient lost consciousness for approx. 10 seconds.

## 2019-01-10 NOTE — Discharge Instructions (Addendum)
Please seek medical attention for any high fevers, chest pain, shortness of breath, change in behavior, persistent vomiting, bloody stool or any other new or concerning symptoms.  

## 2019-01-11 ENCOUNTER — Encounter: Payer: Self-pay | Admitting: Internal Medicine

## 2019-01-11 DIAGNOSIS — M25561 Pain in right knee: Secondary | ICD-10-CM | POA: Insufficient documentation

## 2019-01-11 NOTE — Assessment & Plan Note (Signed)
Persistent pain.  Followed by Dr Chasnis.   

## 2019-01-11 NOTE — Assessment & Plan Note (Signed)
Right knee pain.  Due to f/u with Dr Sharlet Salina regarding her back.  Will notify me if desires further w/up regarding her knee.

## 2019-01-11 NOTE — Assessment & Plan Note (Signed)
Controlled.  

## 2019-01-11 NOTE — Assessment & Plan Note (Signed)
Doing well on plavix.  

## 2019-01-11 NOTE — Assessment & Plan Note (Signed)
On simvastatin.  Low cholesterol diet and exercise.  Follow lipid panel and liver function tests.   

## 2019-02-07 ENCOUNTER — Encounter: Payer: Self-pay | Admitting: Internal Medicine

## 2019-02-10 ENCOUNTER — Other Ambulatory Visit: Payer: Self-pay

## 2019-02-10 MED ORDER — FAMOTIDINE 20 MG PO TABS: 20.0000 mg | ORAL_TABLET | Freq: Every day | ORAL | 1 refills | Status: DC

## 2019-02-10 NOTE — Progress Notes (Signed)
I have sent in a prescription for famotidine 20 mg q day to replace her zantac per patient request. Just an FYI for you.

## 2019-02-10 NOTE — Progress Notes (Signed)
Noted.  Just confirm pt aware.

## 2019-03-01 ENCOUNTER — Other Ambulatory Visit: Payer: Self-pay | Admitting: Internal Medicine

## 2019-03-16 ENCOUNTER — Other Ambulatory Visit: Payer: Self-pay | Admitting: Internal Medicine

## 2019-03-25 ENCOUNTER — Other Ambulatory Visit: Payer: Self-pay

## 2019-03-25 ENCOUNTER — Ambulatory Visit (INDEPENDENT_AMBULATORY_CARE_PROVIDER_SITE_OTHER): Payer: Medicare Other

## 2019-03-25 DIAGNOSIS — Z0000 Encounter for general adult medical examination without abnormal findings: Secondary | ICD-10-CM

## 2019-03-25 NOTE — Patient Instructions (Addendum)
  Joan Ritter , Thank you for taking time to come for your Medicare Wellness Visit. I appreciate your ongoing commitment to your health goals. Please review the following plan we discussed and let me know if I can assist you in the future.   These are the goals we discussed: Goals    . Maintain Healthy Lifestyle     Stay active Healthy diet        This is a list of the screening recommended for you and due dates:  Health Maintenance  Topic Date Due  .  Hepatitis C: One time screening is recommended by Center for Disease Control  (CDC) for  adults born from [birth year] through 1965.   [birth year]  . Tetanus Vaccine  11/26/1964  . Mammogram  12/11/2018  . Flu Shot  05/24/2019  . Colon Cancer Screening  08/10/2024  . DEXA scan (bone density measurement)  Completed  . Pneumonia vaccines  Completed

## 2019-03-25 NOTE — Progress Notes (Signed)
Subjective:   Joan Ritter is a 73 y.o. female who presents for Medicare Annual (Subsequent) preventive examination.  Review of Systems:  No ROS.  Medicare Wellness Virtual Visit.  Visual/audio telehealth visit, UTA vital signs.   See social history for additional risk factors.  Cardiac Risk Factors include: advanced age (>44men, >21 women)     Objective:     Vitals: There were no vitals taken for this visit.  There is no height or weight on file to calculate BMI.  Advanced Directives 03/25/2019 01/10/2019 02/22/2017  Does Patient Have a Medical Advance Directive? No No No  Does patient want to make changes to medical advance directive? Yes (MAU/Ambulatory/Procedural Areas - Information given) - -  Would patient like information on creating a medical advance directive? - No - Patient declined No - Patient declined    Tobacco Social History   Tobacco Use  Smoking Status Never Smoker  Smokeless Tobacco Never Used     Counseling given: Not Answered   Clinical Intake:  Pre-visit preparation completed: Yes        Diabetes: No  How often do you need to have someone help you when you read instructions, pamphlets, or other written materials from your doctor or pharmacy?: 1 - Never  Interpreter Needed?: No     Past Medical History:  Diagnosis Date  . Allergic rhinitis    seasonal  . Elevated TSH   . Esophagitis   . GERD (gastroesophageal reflux disease)   . History of Helicobacter pylori infection 2012  . Hypercholesterolemia   . Osteopenia   . TIA (transient ischemic attack) 2001   Past Surgical History:  Procedure Laterality Date  . BREAST BIOPSY Right 2000   neg  . BREAST BIOPSY Right 05/18/2017   neg  . BREAST EXCISIONAL BIOPSY Right 1970   NEG  . COLONOSCOPY  2005   normal   . ESOPHAGOGASTRODUODENOSCOPY  2012   normal   . HEMORRHOID SURGERY  1980s   Family History  Problem Relation Age of Onset  . Lymphoma Father   . CVA Mother        h/o  cerebral hemorrhage  . Brain cancer Brother   . Cardiomyopathy Sister        enlarged heart, died age 33 - sudden death  . Breast cancer Maternal Aunt        x2  . Colon cancer Neg Hx    Social History   Socioeconomic History  . Marital status: Married    Spouse name: Not on file  . Number of children: 2  . Years of education: Not on file  . Highest education level: Not on file  Occupational History  . Not on file  Social Needs  . Financial resource strain: Not on file  . Food insecurity:    Worry: Not on file    Inability: Not on file  . Transportation needs:    Medical: Not on file    Non-medical: Not on file  Tobacco Use  . Smoking status: Never Smoker  . Smokeless tobacco: Never Used  Substance and Sexual Activity  . Alcohol use: No    Alcohol/week: 0.0 standard drinks  . Drug use: No  . Sexual activity: Yes    Birth control/protection: Post-menopausal  Lifestyle  . Physical activity:    Days per week: 4 days    Minutes per session: 60 min  . Stress: Not at all  Relationships  . Social connections:    Talks  on phone: Not on file    Gets together: Not on file    Attends religious service: Not on file    Active member of club or organization: Not on file    Attends meetings of clubs or organizations: Not on file    Relationship status: Not on file  Other Topics Concern  . Not on file  Social History Narrative  . Not on file    Outpatient Encounter Medications as of 03/25/2019  Medication Sig  . Acetaminophen (TYLENOL ARTHRITIS PAIN PO) Take by mouth 2 (two) times daily.  Marland Kitchen acyclovir (ZOVIRAX) 800 MG tablet TAKE AS DIRECTED  . aspirin 81 MG tablet Take 81 mg by mouth daily.  . Calcium Carbonate-Vit D-Min 600-400 MG-UNIT TABS Take 1 tablet by mouth 2 (two) times daily.  . cetirizine (ZYRTEC) 10 MG tablet Take 10 mg by mouth daily as needed.   . clopidogrel (PLAVIX) 75 MG tablet TAKE 1 TABLET BY MOUTH DAILY  . famotidine (PEPCID) 20 MG tablet Take 1 tablet  (20 mg total) by mouth daily.  . fluticasone (FLONASE) 50 MCG/ACT nasal spray USE 2 SPRAYS EACH NOSTRIL EVERY DAY (Patient taking differently: USE 2 SPRAYS EACH NOSTRIL DAILY AS NEEDED)  . Multiple Vitamin (MULTIVITAMIN) tablet Take 1 tablet by mouth daily.  . pantoprazole (PROTONIX) 40 MG tablet TAKE 1 TABLET BY MOUTH EVERY DAY  . Probiotic Product (PROBIOTIC-10 PO) Take by mouth.  . simvastatin (ZOCOR) 20 MG tablet TAKE 1 TABLET BY MOUTH EVERYDAY AT BEDTIME  . traMADol (ULTRAM) 50 MG tablet 1/2 TO 1 TABLET BY MOUTH TWICE DAILY AS NEEDED   No facility-administered encounter medications on file as of 03/25/2019.     Activities of Daily Living In your present state of health, do you have any difficulty performing the following activities: 03/25/2019  Hearing? N  Vision? N  Difficulty concentrating or making decisions? N  Walking or climbing stairs? N  Dressing or bathing? N  Doing errands, shopping? N  Preparing Food and eating ? N  Using the Toilet? N  In the past six months, have you accidently leaked urine? N  Do you have problems with loss of bowel control? N  Managing your Medications? N  Managing your Finances? N  Housekeeping or managing your Housekeeping? N  Some recent data might be hidden    Patient Care Team: Einar Pheasant, MD as PCP - General (Internal Medicine)    Assessment:   This is a routine wellness examination for Joan Ritter.  I connected with patient 03/25/19 at 12:00 PM EDT by a video/audio enabled telemedicine application and verified that I am speaking with the correct person using two identifiers. Patient stated full name and DOB. Patient gave permission to continue with virtual visit. Patient's location was at home and Nurse's location was at [place] office.   Health Screenings  Mammogram - 11/2017; new scheduled 04/2019 Colonoscopy - 03/2014 Bone Density - 05/2015 Glaucoma -none Hearing -demonstrates normal hearing during visit. Labs followed by pcp  Cholesterol - 12/2018 Dental- visits every 6 months. Vision- visits within the last 12 months.  Social  Alcohol intake - no          Smoking history- never    Smokers in home? none Illicit drug use? none Exercise - active around the home with yard work, walking, video zumba  Diet - regular Sexually Active -not currently BMI- discussed the importance of a healthy diet, water intake and the benefits of aerobic exercise.  Educational material provided.  Safety  Patient feels safe at home- yes Patient does have smoke detectors at home- yes Patient does wear sunscreen or protective clothing when in direct sunlight -yes Patient does wear seat belt when in a moving vehicle -yes  Covid-19 precautions and sickness symptoms discussed.   Activities of Daily Living Patient denies needing assistance with: driving, household chores, feeding themselves, getting from bed to chair, getting to the toilet, bathing/showering, dressing, managing money, or preparing meals.  No new identified risk were noted.    Depression Screen Patient denies losing interest in daily life, feeling hopeless, or crying easily over simple problems.   Medication-taking as directed and without issues.   Fall Screen Patient denies being afraid of falling or falling in the last year.   Memory Screen Patient is alert.  Patient denies difficulty focusing, concentrating or misplacing items. Correctly identified the president of the [place], season and recall. Patient likes to read and play scrabble for brain stimulation.  Immunizations The following Immunizations were discussed: Influenza, shingles, pneumonia, and tetanus.   Other Providers Patient Care Team: Einar Pheasant, MD as PCP - General (Internal Medicine)  Exercise Activities and Dietary recommendations Current Exercise Habits: Home exercise routine, Intensity: Mild  Goals    . Maintain Healthy Lifestyle     Stay active Healthy diet        Fall  Risk Fall Risk  03/25/2019 02/22/2017 10/09/2016 04/21/2016 04/20/2015  Falls in the past year? 1 No No No No  Injury with Fall? 1 - - - -  Comment She sought medical care afterwards.  Followed by pcp.  - - - -   Depression Screen PHQ 2/9 Scores 03/25/2019 02/22/2017 10/09/2016 04/21/2016  PHQ - 2 Score 0 0 0 0  PHQ- 9 Score - 0 - -     Cognitive Function MMSE - Mini Mental State Exam 02/22/2017  Orientation to time 5  Orientation to Place 5  Registration 3  Attention/ Calculation 5  Recall 3  Language- name 2 objects 2  Language- repeat 1  Language- follow 3 step command 3  Language- read & follow direction 1  Write a sentence 1  Copy design 1  Total score 30     6CIT Screen 03/25/2019  What Year? 0 points  What month? 0 points  What time? 0 points  Count back from 20 0 points  Months in reverse 0 points  Repeat phrase 0 points  Total Score 0    Immunization History  Administered Date(s) Administered  . Influenza Split 08/18/2014  . Influenza, High Dose Seasonal PF 06/28/2017, 07/03/2018  . Influenza-Unspecified 07/23/2013, 09/01/2015, 07/26/2016  . Pneumococcal Conjugate-13 05/01/2014  . Pneumococcal Polysaccharide-23 08/21/2017  . Zoster 10/23/2005  . Zoster Recombinat (Shingrix) 02/22/2017, 06/28/2017   Screening Tests Health Maintenance  Topic Date Due  . Hepatitis C Screening  04-25-1946  . TETANUS/TDAP  11/26/1964  . MAMMOGRAM  12/11/2018  . INFLUENZA VACCINE  05/24/2019  . COLONOSCOPY  08/10/2024  . DEXA SCAN  Completed  . PNA vac Low Risk Adult  Completed      Plan:    End of life planning; Advance aging; Advanced directives discussed.  Copy of current HCPOA/Living Will requested upon completion.    Return in September for fasting labs and appointment with pcp.   I have personally reviewed and noted the following in the patient's chart:   . Medical and social history . Use of alcohol, tobacco or illicit drugs  . Current medications and supplements .  Functional ability and status . Nutritional status . Physical activity . Advanced directives . List of other physicians . Hospitalizations, surgeries, and ER visits in previous 12 months . Vitals . Screenings to include cognitive, depression, and falls . Referrals and appointments  In addition, I have reviewed and discussed with patient certain preventive protocols, quality metrics, and best practice recommendations. A written personalized care plan for preventive services as well as general preventive health recommendations were provided to patient.     Varney Biles, LPN  12/26/5730   Reviewed above information.  Agree with assessment and plan.   Dr Nicki Reaper

## 2019-04-27 ENCOUNTER — Other Ambulatory Visit: Payer: Self-pay | Admitting: Internal Medicine

## 2019-05-15 ENCOUNTER — Ambulatory Visit
Admission: RE | Admit: 2019-05-15 | Discharge: 2019-05-15 | Disposition: A | Discharge status: Home / Self Care | Payer: Medicare Other | Source: Ambulatory Visit | Attending: Internal Medicine | Admitting: Internal Medicine

## 2019-05-15 DIAGNOSIS — Z1231 Encounter for screening mammogram for malignant neoplasm of breast: Secondary | ICD-10-CM | POA: Insufficient documentation

## 2019-07-08 ENCOUNTER — Other Ambulatory Visit: Payer: Medicare Other

## 2019-07-11 ENCOUNTER — Ambulatory Visit: Payer: Medicare Other | Admitting: Internal Medicine

## 2019-07-16 ENCOUNTER — Other Ambulatory Visit (INDEPENDENT_AMBULATORY_CARE_PROVIDER_SITE_OTHER): Payer: Medicare Other

## 2019-07-16 ENCOUNTER — Other Ambulatory Visit: Payer: Self-pay

## 2019-07-16 DIAGNOSIS — E7800 Pure hypercholesterolemia, unspecified: Secondary | ICD-10-CM | POA: Diagnosis not present

## 2019-07-16 LAB — CBC WITH DIFFERENTIAL/PLATELET
Basophils Absolute: 0.1 10*3/uL (ref 0.0–0.1)
Basophils Relative: 0.9 % (ref 0.0–3.0)
Eosinophils Absolute: 0.2 10*3/uL (ref 0.0–0.7)
Eosinophils Relative: 3.4 % (ref 0.0–5.0)
HCT: 42.1 % (ref 36.0–46.0)
Hemoglobin: 14.3 g/dL (ref 12.0–15.0)
Lymphocytes Relative: 32.9 % (ref 12.0–46.0)
Lymphs Abs: 2 10*3/uL (ref 0.7–4.0)
MCHC: 33.9 g/dL (ref 30.0–36.0)
MCV: 96 fl (ref 78.0–100.0)
Monocytes Absolute: 0.5 10*3/uL (ref 0.1–1.0)
Monocytes Relative: 7.4 % (ref 3.0–12.0)
Neutro Abs: 3.4 10*3/uL (ref 1.4–7.7)
Neutrophils Relative %: 55.4 % (ref 43.0–77.0)
Platelets: 200 10*3/uL (ref 150.0–400.0)
RBC: 4.38 Mil/uL (ref 3.87–5.11)
RDW: 13.2 % (ref 11.5–15.5)
WBC: 6.1 10*3/uL (ref 4.0–10.5)

## 2019-07-16 LAB — LIPID PANEL
Cholesterol: 187 mg/dL (ref 0–200)
HDL: 61.4 mg/dL (ref >39.00)
LDL Cholesterol: 104 mg/dL — ABNORMAL HIGH (ref 0–99)
NonHDL: 125.29
Total CHOL/HDL Ratio: 3
Triglycerides: 107 mg/dL (ref 0.0–149.0)
VLDL: 21.4 mg/dL (ref 0.0–40.0)

## 2019-07-16 LAB — BASIC METABOLIC PANEL
BUN: 13 mg/dL (ref 6–23)
CO2: 30 mEq/L (ref 19–32)
Calcium: 9.7 mg/dL (ref 8.4–10.5)
Chloride: 104 mEq/L (ref 96–112)
Creatinine, Ser: 0.84 mg/dL (ref 0.40–1.20)
GFR: 66.34 mL/min (ref >60.00)
Glucose, Bld: 84 mg/dL (ref 70–99)
Potassium: 4.1 mEq/L (ref 3.5–5.1)
Sodium: 142 mEq/L (ref 135–145)

## 2019-07-16 LAB — HEPATIC FUNCTION PANEL
ALT: 16 U/L (ref 0–35)
AST: 17 U/L (ref 0–37)
Albumin: 4 g/dL (ref 3.5–5.2)
Alkaline Phosphatase: 47 U/L (ref 39–117)
Bilirubin, Direct: 0.1 mg/dL (ref 0.0–0.3)
Total Bilirubin: 0.6 mg/dL (ref 0.2–1.2)
Total Protein: 6 g/dL (ref 6.0–8.3)

## 2019-07-16 LAB — TSH: TSH: 3.02 u[IU]/mL (ref 0.35–4.50)

## 2019-07-17 ENCOUNTER — Encounter: Payer: Self-pay | Admitting: Internal Medicine

## 2019-07-21 ENCOUNTER — Ambulatory Visit (INDEPENDENT_AMBULATORY_CARE_PROVIDER_SITE_OTHER): Payer: Medicare Other | Admitting: Internal Medicine

## 2019-07-21 ENCOUNTER — Other Ambulatory Visit: Payer: Self-pay

## 2019-07-21 ENCOUNTER — Encounter: Payer: Self-pay | Admitting: Internal Medicine

## 2019-07-21 DIAGNOSIS — E7800 Pure hypercholesterolemia, unspecified: Secondary | ICD-10-CM

## 2019-07-21 DIAGNOSIS — M5441 Lumbago with sciatica, right side: Secondary | ICD-10-CM | POA: Diagnosis not present

## 2019-07-21 DIAGNOSIS — Z8673 Personal history of transient ischemic attack (TIA), and cerebral infarction without residual deficits: Secondary | ICD-10-CM | POA: Diagnosis not present

## 2019-07-21 DIAGNOSIS — K219 Gastro-esophageal reflux disease without esophagitis: Secondary | ICD-10-CM

## 2019-07-21 NOTE — Assessment & Plan Note (Signed)
On simvastatin.  Low cholesterol diet and exercise.  Follow lipid panel and liver function tests.   Lab Results  Component Value Date   CHOL 187 07/16/2019   HDL 61.40 07/16/2019   LDLCALC 104 (H) 07/16/2019   TRIG 107.0 07/16/2019   CHOLHDL 3 07/16/2019

## 2019-07-21 NOTE — Assessment & Plan Note (Signed)
Controlled.  

## 2019-07-21 NOTE — Progress Notes (Signed)
Patient ID: Joan Ritter, female   DOB: 1946-05-12, 73 y.o.   MRN: [id number]   Virtual Visit via video Note  This visit type was conducted due to national recommendations for restrictions regarding the COVID-19 pandemic (e.g. social distancing).  This format is felt to be most appropriate for this patient at this time.  All issues noted in this document were discussed and addressed.  No physical exam was performed (except for noted visual exam findings with Video Visits).   I connected with Andres Shad by a video enabled telemedicine application or telephone and verified that I am speaking with the correct person using two identifiers. Location patient: home Location provider: work  Persons participating in the virtual visit: patient, provider  I discussed the limitations, risks, security and privacy concerns of performing an evaluation and management service by video and the availability of in person appointments.  The patient expressed understanding and agreed to proceed.   Reason for visit: scheduled follow up.    HPI: She reports she is doing well.  Feels good.  Has been exercising.  Losing weight.  Has adjusted her diet.  No chest pain.  No sob.  No acid reflux. No abdominal pain.  Bowels moving.  Back is doing better since she has been exercising more.  Is using CBD oil.  Very small amount.  Feels has helped.  Is not taking lyrica and not taking any other pain medication or muscle relaxer. Feels good.     ROS: See pertinent positives and negatives per HPI.  Past Medical History:  Diagnosis Date  . Allergic rhinitis    seasonal  . Elevated TSH   . Esophagitis   . GERD (gastroesophageal reflux disease)   . History of Helicobacter pylori infection 2012  . Hypercholesterolemia   . Osteopenia   . TIA (transient ischemic attack) 2001    Past Surgical History:  Procedure Laterality Date  . BREAST BIOPSY Right 2000   neg  . BREAST BIOPSY Right 05/18/2017   neg  . BREAST  EXCISIONAL BIOPSY Right 1970   NEG  . COLONOSCOPY  2005   normal   . ESOPHAGOGASTRODUODENOSCOPY  2012   normal   . HEMORRHOID SURGERY  1980s    Family History  Problem Relation Age of Onset  . Lymphoma Father   . CVA Mother        h/o cerebral hemorrhage  . Brain cancer Brother   . Cardiomyopathy Sister        enlarged heart, died age 70 - sudden death  . Breast cancer Maternal Aunt        x2  . Colon cancer Neg Hx     SOCIAL HX: reviewed.    Current Outpatient Medications:  .  Acetaminophen (TYLENOL ARTHRITIS PAIN PO), Take by mouth 2 (two) times daily., Disp: , Rfl:  .  acyclovir (ZOVIRAX) 800 MG tablet, TAKE AS DIRECTED, Disp: 30 tablet, Rfl: 0 .  aspirin 81 MG tablet, Take 81 mg by mouth daily., Disp: , Rfl:  .  Calcium Carbonate-Vit D-Min 600-400 MG-UNIT TABS, Take 1 tablet by mouth 2 (two) times daily., Disp: , Rfl:  .  cetirizine (ZYRTEC) 10 MG tablet, Take 10 mg by mouth daily as needed. , Disp: , Rfl:  .  clopidogrel (PLAVIX) 75 MG tablet, TAKE 1 TABLET BY MOUTH DAILY, Disp: 90 tablet, Rfl: 2 .  famotidine (PEPCID) 20 MG tablet, Take 1 tablet (20 mg total) by mouth daily., Disp: 90 tablet, Rfl: 1 .  fluticasone (FLONASE) 50 MCG/ACT nasal spray, USE 2 SPRAYS EACH NOSTRIL EVERY DAY (Patient taking differently: USE 2 SPRAYS EACH NOSTRIL DAILY AS NEEDED), Disp: 16 g, Rfl: 5 .  Multiple Vitamin (MULTIVITAMIN) tablet, Take 1 tablet by mouth daily., Disp: , Rfl:  .  pantoprazole (PROTONIX) 40 MG tablet, TAKE 1 TABLET BY MOUTH EVERY DAY, Disp: 90 tablet, Rfl: 1 .  Probiotic Product (PROBIOTIC-10 PO), Take by mouth., Disp: , Rfl:  .  simvastatin (ZOCOR) 20 MG tablet, TAKE 1 TABLET BY MOUTH EVERYDAY AT BEDTIME, Disp: 90 tablet, Rfl: 3 .  traMADol (ULTRAM) 50 MG tablet, 1/2 TO 1 TABLET BY MOUTH TWICE DAILY AS NEEDED, Disp: , Rfl:   EXAM:  GENERAL: alert, oriented, appears well and in no acute distress  HEENT: atraumatic, conjunttiva clear, no obvious abnormalities on  inspection of external nose and ears  NECK: normal movements of the head and neck  LUNGS: on inspection no signs of respiratory distress, breathing rate appears normal, no obvious gross SOB, gasping or wheezing  CV: no obvious cyanosis  PSYCH/NEURO: pleasant and cooperative, no obvious depression or anxiety, speech and thought processing grossly intact  ASSESSMENT AND PLAN:  Discussed the following assessment and plan:  Back pain Has been followed by Dr Sharlet Salina.  Back is doing better since she has been exercising more.  Follow.   GERD (gastroesophageal reflux disease) Controlled.    History of TIA (transient ischemic attack) Doing well on plavix.    Hypercholesterolemia On simvastatin.  Low cholesterol diet and exercise.  Follow lipid panel and liver function tests.   Lab Results  Component Value Date   CHOL 187 07/16/2019   HDL 61.40 07/16/2019   LDLCALC 104 (H) 07/16/2019   TRIG 107.0 07/16/2019   CHOLHDL 3 07/16/2019     I discussed the assessment and treatment plan with the patient. The patient was provided an opportunity to ask questions and all were answered. The patient agreed with the plan and demonstrated an understanding of the instructions.   The patient was advised to call back or seek an in-person evaluation if the symptoms worsen or if the condition fails to improve as anticipated.   Einar Pheasant, MD

## 2019-07-21 NOTE — Assessment & Plan Note (Signed)
Has been followed by Dr Sharlet Salina.  Back is doing better since she has been exercising more.  Follow.

## 2019-07-21 NOTE — Assessment & Plan Note (Signed)
Doing well on plavix.  

## 2019-07-26 ENCOUNTER — Other Ambulatory Visit: Payer: Self-pay | Admitting: Internal Medicine

## 2019-08-04 ENCOUNTER — Other Ambulatory Visit: Payer: Self-pay | Admitting: Internal Medicine

## 2019-08-29 ENCOUNTER — Other Ambulatory Visit: Payer: Self-pay | Admitting: Internal Medicine

## 2019-09-24 ENCOUNTER — Other Ambulatory Visit: Payer: Self-pay | Admitting: Internal Medicine

## 2019-10-27 ENCOUNTER — Other Ambulatory Visit: Payer: Self-pay | Admitting: Internal Medicine

## 2019-11-12 ENCOUNTER — Ambulatory Visit: Payer: Medicare PPO | Attending: Internal Medicine

## 2019-11-12 DIAGNOSIS — Z23 Encounter for immunization: Secondary | ICD-10-CM | POA: Insufficient documentation

## 2019-11-12 NOTE — Progress Notes (Signed)
   Covid-19 Vaccination Clinic  Name:  RAELYNE HABECKER    MRN: [id number] DOB: 02-21-1946  11/12/2019  Ms. Conde was observed post Covid-19 immunization for 15 minutes without incidence. She was provided with Vaccine Information Sheet and instruction to access the V-Safe system.   Ms. Streicher was instructed to call 911 with any severe reactions post vaccine: Marland Kitchen Difficulty breathing  . Swelling of your face and throat  . A fast heartbeat  . A bad rash all over your body  . Dizziness and weakness    Immunizations Administered    Name Date Dose VIS Date Route   Pfizer COVID-19 Vaccine 11/12/2019  4:47 PM 0.3 mL 10/03/2019 Intramuscular   Manufacturer: [place]   Lot: [id number]   [place]: [id number]

## 2019-12-03 ENCOUNTER — Ambulatory Visit: Payer: Medicare PPO | Attending: Internal Medicine

## 2019-12-03 DIAGNOSIS — Z23 Encounter for immunization: Secondary | ICD-10-CM

## 2019-12-03 NOTE — Progress Notes (Signed)
   Covid-19 Vaccination Clinic  Name:  Joan Ritter    MRN: [id number] DOB: 08/04/1946  12/03/2019  [name] was observed post Covid-19 immunization for 15 minutes without incidence. She was provided with Vaccine Information Sheet and instruction to access the V-Safe system.   [name] was instructed to call 911 with any severe reactions post vaccine: Marland Kitchen Difficulty breathing  . Swelling of your face and throat  . A fast heartbeat  . A bad rash all over your body  . Dizziness and weakness    Immunizations Administered    Name Date Dose VIS Date Route   Pfizer COVID-19 Vaccine 12/03/2019  8:47 AM 0.3 mL 10/03/2019 Intramuscular   Manufacturer: [place]   Lot: [id number]   [place]: [id number]

## 2019-12-07 ENCOUNTER — Other Ambulatory Visit: Payer: Self-pay | Admitting: Internal Medicine

## 2020-01-12 ENCOUNTER — Other Ambulatory Visit: Payer: Self-pay | Admitting: Internal Medicine

## 2020-01-20 ENCOUNTER — Other Ambulatory Visit: Payer: Self-pay

## 2020-01-20 ENCOUNTER — Other Ambulatory Visit (INDEPENDENT_AMBULATORY_CARE_PROVIDER_SITE_OTHER): Payer: Medicare PPO

## 2020-01-20 DIAGNOSIS — E7800 Pure hypercholesterolemia, unspecified: Secondary | ICD-10-CM

## 2020-01-20 LAB — LIPID PANEL
Cholesterol: 166 mg/dL (ref 0–200)
HDL: 54.4 mg/dL (ref >39.00)
LDL Cholesterol: 91 mg/dL (ref 0–99)
NonHDL: 111.18
Total CHOL/HDL Ratio: 3
Triglycerides: 103 mg/dL (ref 0.0–149.0)
VLDL: 20.6 mg/dL (ref 0.0–40.0)

## 2020-01-20 LAB — BASIC METABOLIC PANEL
BUN: 14 mg/dL (ref 6–23)
CO2: 30 mEq/L (ref 19–32)
Calcium: 9.2 mg/dL (ref 8.4–10.5)
Chloride: 107 mEq/L (ref 96–112)
Creatinine, Ser: 0.91 mg/dL (ref 0.40–1.20)
GFR: 60.41 mL/min (ref >60.00)
Glucose, Bld: 98 mg/dL (ref 70–99)
Potassium: 3.9 mEq/L (ref 3.5–5.1)
Sodium: 142 mEq/L (ref 135–145)

## 2020-01-20 LAB — HEPATIC FUNCTION PANEL
ALT: 22 U/L (ref 0–35)
AST: 21 U/L (ref 0–37)
Albumin: 3.9 g/dL (ref 3.5–5.2)
Alkaline Phosphatase: 45 U/L (ref 39–117)
Bilirubin, Direct: 0.1 mg/dL (ref 0.0–0.3)
Total Bilirubin: 0.6 mg/dL (ref 0.2–1.2)
Total Protein: 6 g/dL (ref 6.0–8.3)

## 2020-01-22 ENCOUNTER — Other Ambulatory Visit: Payer: Self-pay

## 2020-01-22 ENCOUNTER — Encounter: Payer: Self-pay | Admitting: Internal Medicine

## 2020-01-22 ENCOUNTER — Ambulatory Visit (INDEPENDENT_AMBULATORY_CARE_PROVIDER_SITE_OTHER): Payer: Medicare PPO | Admitting: Internal Medicine

## 2020-01-22 VITALS — BP 124/78 | HR 53 | Temp 96.9°F | Resp 16 | Ht 63.0 in | Wt 121.4 lb

## 2020-01-22 DIAGNOSIS — K219 Gastro-esophageal reflux disease without esophagitis: Secondary | ICD-10-CM

## 2020-01-22 DIAGNOSIS — Z8673 Personal history of transient ischemic attack (TIA), and cerebral infarction without residual deficits: Secondary | ICD-10-CM | POA: Diagnosis not present

## 2020-01-22 DIAGNOSIS — Z1231 Encounter for screening mammogram for malignant neoplasm of breast: Secondary | ICD-10-CM | POA: Diagnosis not present

## 2020-01-22 DIAGNOSIS — E2839 Other primary ovarian failure: Secondary | ICD-10-CM

## 2020-01-22 DIAGNOSIS — Z0000 Encounter for general adult medical examination without abnormal findings: Secondary | ICD-10-CM

## 2020-01-22 DIAGNOSIS — E7800 Pure hypercholesterolemia, unspecified: Secondary | ICD-10-CM | POA: Diagnosis not present

## 2020-01-22 DIAGNOSIS — Z1211 Encounter for screening for malignant neoplasm of colon: Secondary | ICD-10-CM

## 2020-01-22 NOTE — Progress Notes (Addendum)
Patient ID: Joan Ritter, female   DOB: 1946-09-01, 74 y.o.   MRN: [id number]   Subjective:    Patient ID: Joan Ritter, female    DOB: May 14, 1946, 74 y.o.   MRN: [id number]  HPI This visit occurred during the SARS-CoV-2 public health emergency.  Safety protocols were in place, including screening questions prior to the visit, additional usage of staff PPE, and extensive cleaning of exam room while observing appropriate contact time as indicated for disinfecting solutions.  Patient here for her physical exam.  She reports she is doing well.  Staying active.  No chest pain or sob with increased activity or exertion.  No acid relfux reported.  Previous fall.  Elbow better.  States took approximately 8 weeks to resolve.  No abdominal pain or bowel change.  She is exercising on zoom.  Discussed bone density.  Wants to schedule with her mammogram.    Past Medical History:  Diagnosis Date  . Allergic rhinitis    seasonal  . Elevated TSH   . Esophagitis   . GERD (gastroesophageal reflux disease)   . History of Helicobacter pylori infection 2012  . Hypercholesterolemia   . Osteopenia   . TIA (transient ischemic attack) 2001   Past Surgical History:  Procedure Laterality Date  . BREAST BIOPSY Right 2000   neg  . BREAST BIOPSY Right 05/18/2017   neg  . BREAST EXCISIONAL BIOPSY Right 1970   NEG  . COLONOSCOPY  2005   normal   . ESOPHAGOGASTRODUODENOSCOPY  2012   normal   . HEMORRHOID SURGERY  1980s   Family History  Problem Relation Age of Onset  . Lymphoma Father   . CVA Mother        h/o cerebral hemorrhage  . Brain cancer Brother   . Cardiomyopathy Sister        enlarged heart, died age 56 - sudden death  . Breast cancer Maternal Aunt        x2  . Colon cancer Neg Hx    Social History   Socioeconomic History  . Marital status: Married    Spouse name: Not on file  . Number of children: 2  . Years of education: Not on file  . Highest education level: Not on file    Occupational History  . Not on file  Tobacco Use  . Smoking status: Never Smoker  . Smokeless tobacco: Never Used  Substance and Sexual Activity  . Alcohol use: No    Alcohol/week: 0.0 standard drinks  . Drug use: No  . Sexual activity: Yes    Birth control/protection: Post-menopausal, Abstinence  Other Topics Concern  . Not on file  Social History Narrative  . Not on file   Social Determinants of Health   Financial Resource Strain:   . Difficulty of Paying Living Expenses:   Food Insecurity:   . Worried About Charity fundraiser in the Last Year:   . Arboriculturist in the Last Year:   Transportation Needs:   . Film/video editor (Medical):   Marland Kitchen Lack of Transportation (Non-Medical):   Physical Activity:   . Days of Exercise per Week:   . Minutes of Exercise per Session:   Stress:   . Feeling of Stress :   Social Connections:   . Frequency of Communication with Friends and Family:   . Frequency of Social Gatherings with Friends and Family:   . Attends Religious Services:   . Active Member of Clubs  or Organizations:   . Attends Archivist Meetings:   Marland Kitchen Marital Status:     Outpatient Encounter Medications as of 01/22/2020  Medication Sig  . Acetaminophen (TYLENOL ARTHRITIS PAIN PO) Take by mouth 2 (two) times daily.  Marland Kitchen acyclovir (ZOVIRAX) 800 MG tablet TAKE AS DIRECTED  . aspirin 81 MG tablet Take 81 mg by mouth daily.  . Calcium Carbonate-Vit D-Min 600-400 MG-UNIT TABS Take 1 tablet by mouth 2 (two) times daily.  . cetirizine (ZYRTEC) 10 MG tablet Take 10 mg by mouth daily as needed.   . clopidogrel (PLAVIX) 75 MG tablet TAKE 1 TABLET BY MOUTH EVERY DAY  . famotidine (PEPCID) 20 MG tablet TAKE 1 TABLET BY MOUTH EVERY DAY  . fluticasone (FLONASE) 50 MCG/ACT nasal spray USE 2 SPRAYS EACH NOSTRIL EVERY DAY (Patient taking differently: USE 2 SPRAYS EACH NOSTRIL DAILY AS NEEDED)  . Multiple Vitamin (MULTIVITAMIN) tablet Take 1 tablet by mouth daily.  .  pantoprazole (PROTONIX) 40 MG tablet TAKE 1 TABLET BY MOUTH EVERY DAY  . Probiotic Product (PROBIOTIC-10 PO) Take by mouth.  . simvastatin (ZOCOR) 20 MG tablet TAKE 1 TABLET BY MOUTH EVERYDAY AT BEDTIME  . traMADol (ULTRAM) 50 MG tablet 1/2 TO 1 TABLET BY MOUTH TWICE DAILY AS NEEDED   No facility-administered encounter medications on file as of 01/22/2020.   Review of Systems  Constitutional: Negative for appetite change and unexpected weight change.  HENT: Negative for congestion and sinus pressure.   Eyes: Negative for pain and visual disturbance.  Respiratory: Negative for cough, chest tightness and shortness of breath.   Cardiovascular: Negative for chest pain, palpitations and leg swelling.  Gastrointestinal: Negative for abdominal pain, diarrhea, nausea and vomiting.  Genitourinary: Negative for difficulty urinating and dysuria.  Musculoskeletal: Negative for joint swelling and myalgias.  Skin: Negative for color change and rash.  Neurological: Negative for dizziness, light-headedness and headaches.  Hematological: Negative for adenopathy. Does not bruise/bleed easily.  Psychiatric/Behavioral: Negative for agitation and dysphoric mood.       Objective:    Physical Exam Constitutional:      General: She is not in acute distress.    Appearance: Normal appearance. She is well-developed.  HENT:     Head: Normocephalic and atraumatic.     Right Ear: External ear normal.     Left Ear: External ear normal.  Eyes:     General: No scleral icterus.       Right eye: No discharge.        Left eye: No discharge.     Conjunctiva/sclera: Conjunctivae normal.  Neck:     Thyroid: No thyromegaly.  Cardiovascular:     Rate and Rhythm: Normal rate and regular rhythm.  Pulmonary:     Effort: No tachypnea, accessory muscle usage or respiratory distress.     Breath sounds: Normal breath sounds. No decreased breath sounds or wheezing.  Chest:     Breasts:        Right: No inverted nipple,  mass, nipple discharge or tenderness (no axillary adenopathy).        Left: No inverted nipple, mass, nipple discharge or tenderness (no axilarry adenopathy).  Abdominal:     General: Bowel sounds are normal.     Palpations: Abdomen is soft.     Tenderness: There is no abdominal tenderness.  Musculoskeletal:        General: No swelling or tenderness.     Cervical back: Neck supple. No tenderness.  Lymphadenopathy:  Cervical: No cervical adenopathy.  Skin:    Findings: No erythema or rash.  Neurological:     Mental Status: She is alert and oriented to person, place, and time.  Psychiatric:        Mood and Affect: Mood normal.        Behavior: Behavior normal.     BP 124/78   Pulse (!) 53   Temp (!) 96.9 F (36.1 C)   Resp 16   Ht 5\' 3"  (1.6 m)   Wt 121 lb 6.4 oz (55.1 kg)   SpO2 99%   BMI 21.51 kg/m  Wt Readings from Last 3 Encounters:  01/22/20 121 lb 6.4 oz (55.1 kg)  07/21/19 120 lb (54.4 kg)  01/10/19 130 lb (59 kg)     Lab Results  Component Value Date   WBC 6.1 07/16/2019   HGB 14.3 07/16/2019   HCT 42.1 07/16/2019   PLT 200.0 07/16/2019   GLUCOSE 98 01/20/2020   CHOL 166 01/20/2020   TRIG 103.0 01/20/2020   HDL 54.40 01/20/2020   LDLCALC 91 01/20/2020   ALT 22 01/20/2020   AST 21 01/20/2020   NA 142 01/20/2020   K 3.9 01/20/2020   CL 107 01/20/2020   CREATININE 0.91 01/20/2020   BUN 14 01/20/2020   CO2 30 01/20/2020   TSH 3.02 07/16/2019    MM 3D SCREEN BREAST BILATERAL  Result Date: 05/15/2019 CLINICAL DATA:  Screening. EXAM: DIGITAL SCREENING BILATERAL MAMMOGRAM WITH TOMO AND CAD COMPARISON:  Previous exam(s). ACR Breast Density Category c: The breast tissue is heterogeneously dense, which may obscure small masses. FINDINGS: There are no findings suspicious for malignancy. Images were processed with CAD. IMPRESSION: No mammographic evidence of malignancy. A result letter of this screening mammogram will be mailed directly to the patient.  RECOMMENDATION: Screening mammogram in one year. (Code:SM-B-01Y) BI-RADS CATEGORY  1: Negative. Electronically Signed   By: Franki Cabot M.D.   On: 05/15/2019 14:40       Assessment & Plan:   Problem List Items Addressed This Visit    GERD (gastroesophageal reflux disease)    Controlled on protonix.        Health care maintenance    Physical today 01/22/20.  Mammogram 05/15/19 - Birads I.  Colonoscopy 07/2014 - normal.  Give hemoccult cards.  Schedule mammogram and bone density.       History of TIA (transient ischemic attack)    Doing well on plavix.       Hypercholesterolemia    On simvastatin.  Low cholesterol diet and exercise.  Follow lipid panel and liver function tests.        Relevant Orders   CBC with Differential/Platelet   Hepatic function panel   Lipid panel   TSH   Basic metabolic panel    Other Visit Diagnoses    Routine general medical examination at a health care facility    -  Primary   Encounter for screening mammogram for malignant neoplasm of breast       Relevant Orders   MM 3D SCREEN BREAST BILATERAL   Estrogen deficiency       Relevant Orders   DG Bone Density   Colon cancer screening       Relevant Orders   Fecal occult blood, imunochemical       Einar Pheasant, MD

## 2020-01-22 NOTE — Assessment & Plan Note (Addendum)
Physical today 01/22/20.  Mammogram 05/15/19 - Birads I.  Colonoscopy 07/2014 - normal.  Give hemoccult cards.  Schedule mammogram and bone density.

## 2020-01-25 ENCOUNTER — Telehealth: Payer: Self-pay | Admitting: Internal Medicine

## 2020-01-25 ENCOUNTER — Encounter: Payer: Self-pay | Admitting: Internal Medicine

## 2020-01-25 NOTE — Telephone Encounter (Signed)
Please send hemoccult cards to pt.   °

## 2020-01-25 NOTE — Assessment & Plan Note (Signed)
Controlled on protonix.   

## 2020-01-25 NOTE — Assessment & Plan Note (Signed)
On simvastatin.  Low cholesterol diet and exercise.  Follow lipid panel and liver function tests.   

## 2020-01-25 NOTE — Assessment & Plan Note (Signed)
Doing well on plavix.

## 2020-01-25 NOTE — Addendum Note (Signed)
Addended by: Alisa Graff on: 01/25/2020 02:22 PM   Modules accepted: Orders

## 2020-01-26 NOTE — Telephone Encounter (Signed)
Put together ifob kit, labeled and placed up front for pick up.

## 2020-01-26 NOTE — Telephone Encounter (Signed)
LMTCB. Cannot mail. Has to pick up.

## 2020-01-27 NOTE — Telephone Encounter (Signed)
Left detailed message for patient.

## 2020-02-03 ENCOUNTER — Other Ambulatory Visit (INDEPENDENT_AMBULATORY_CARE_PROVIDER_SITE_OTHER): Payer: Medicare PPO

## 2020-02-03 DIAGNOSIS — Z1211 Encounter for screening for malignant neoplasm of colon: Secondary | ICD-10-CM

## 2020-02-03 LAB — FECAL OCCULT BLOOD, IMMUNOCHEMICAL: Fecal Occult Bld: NEGATIVE

## 2020-02-04 ENCOUNTER — Encounter: Payer: Self-pay | Admitting: Internal Medicine

## 2020-02-05 DIAGNOSIS — M2012 Hallux valgus (acquired), left foot: Secondary | ICD-10-CM | POA: Diagnosis not present

## 2020-02-05 DIAGNOSIS — M2011 Hallux valgus (acquired), right foot: Secondary | ICD-10-CM | POA: Diagnosis not present

## 2020-02-10 ENCOUNTER — Encounter: Payer: Self-pay | Admitting: Internal Medicine

## 2020-02-10 DIAGNOSIS — N6459 Other signs and symptoms in breast: Secondary | ICD-10-CM

## 2020-02-10 DIAGNOSIS — D179 Benign lipomatous neoplasm, unspecified: Secondary | ICD-10-CM

## 2020-02-13 NOTE — Telephone Encounter (Signed)
Discussed this with you. Spoke with pt to follow up. Non-urgent.

## 2020-02-14 NOTE — Telephone Encounter (Signed)
Order placed for surgery referral - Dr Peyton Najjar.  Also, she is scheduled for screening mammogram in July.  Given palpable area upper left right breast, is she agreeable for diagnostic mammogram now and then would follow up with screening mammogram as scheduled.  See if agreeable and I will place the order.

## 2020-02-17 NOTE — Telephone Encounter (Signed)
Order placed for diagnostic mammogram and ultrasound (right breast)

## 2020-02-17 NOTE — Telephone Encounter (Signed)
Patient ok to do diagnostic mammogram.

## 2020-02-19 DIAGNOSIS — D1722 Benign lipomatous neoplasm of skin and subcutaneous tissue of left arm: Secondary | ICD-10-CM | POA: Diagnosis not present

## 2020-02-19 DIAGNOSIS — D171 Benign lipomatous neoplasm of skin and subcutaneous tissue of trunk: Secondary | ICD-10-CM | POA: Diagnosis not present

## 2020-02-27 ENCOUNTER — Other Ambulatory Visit: Payer: Medicare PPO

## 2020-03-02 ENCOUNTER — Ambulatory Visit
Admission: RE | Admit: 2020-03-02 | Discharge: 2020-03-02 | Disposition: A | Discharge status: Home / Self Care | Payer: Medicare PPO | Source: Ambulatory Visit | Attending: Internal Medicine | Admitting: Internal Medicine

## 2020-03-02 DIAGNOSIS — N6459 Other signs and symptoms in breast: Secondary | ICD-10-CM | POA: Insufficient documentation

## 2020-03-02 DIAGNOSIS — N6312 Unspecified lump in the right breast, upper inner quadrant: Secondary | ICD-10-CM | POA: Diagnosis not present

## 2020-03-02 DIAGNOSIS — D1720 Benign lipomatous neoplasm of skin and subcutaneous tissue of unspecified limb: Secondary | ICD-10-CM | POA: Diagnosis not present

## 2020-03-02 DIAGNOSIS — R922 Inconclusive mammogram: Secondary | ICD-10-CM | POA: Diagnosis not present

## 2020-03-02 DIAGNOSIS — D2112 Benign neoplasm of connective and other soft tissue of left upper limb, including shoulder: Secondary | ICD-10-CM | POA: Diagnosis not present

## 2020-03-23 ENCOUNTER — Telehealth: Payer: Self-pay | Admitting: Internal Medicine

## 2020-03-23 NOTE — Telephone Encounter (Signed)
Pt wanted Dr. Nicki Reaper to know her stomach issues are better she had some diarrhea for about 2 days and then she got hemorrhoids . She said now she has a little blood when she wipe and wants to know what Dr. Nicki Reaper suggests?

## 2020-03-24 NOTE — Telephone Encounter (Signed)
I last saw pt in 01/2020.  Reviewed message.  It appears she had some recent diarrhea.  Better now.  If persistent blood, can schedule appt with me to discuss.

## 2020-03-24 NOTE — Telephone Encounter (Signed)
Pt will let me know if persistent blood.

## 2020-03-25 ENCOUNTER — Ambulatory Visit: Payer: Medicare Other

## 2020-03-25 ENCOUNTER — Encounter: Payer: Self-pay | Admitting: Internal Medicine

## 2020-04-07 ENCOUNTER — Encounter: Payer: Self-pay | Admitting: Internal Medicine

## 2020-04-07 ENCOUNTER — Ambulatory Visit (INDEPENDENT_AMBULATORY_CARE_PROVIDER_SITE_OTHER): Payer: Medicare PPO

## 2020-04-07 ENCOUNTER — Other Ambulatory Visit: Payer: Self-pay | Admitting: Internal Medicine

## 2020-04-07 VITALS — Ht 63.0 in | Wt 121.0 lb

## 2020-04-07 DIAGNOSIS — Z0000 Encounter for general adult medical examination without abnormal findings: Secondary | ICD-10-CM

## 2020-04-07 NOTE — Progress Notes (Addendum)
Subjective:   Joan Ritter is a 74 y.o. female who presents for Medicare Annual (Subsequent) preventive examination.  Review of Systems:  No ROS.  Medicare Wellness Virtual Visit.   Cardiac Risk Factors include: advanced age (>63men, >57 women)     Objective:     Vitals: Ht 5\' 3"  (1.6 m)   Wt 121 lb (54.9 kg)   BMI 21.43 kg/m   Body mass index is 21.43 kg/m.  Advanced Directives 04/07/2020 03/25/2019 01/10/2019 02/22/2017  Does Patient Have a Medical Advance Directive? No No No No  Does patient want to make changes to medical advance directive? - Yes (MAU/Ambulatory/Procedural Areas - Information given) - -  Would patient like information on creating a medical advance directive? No - Patient declined - No - Patient declined No - Patient declined    Tobacco Social History   Tobacco Use  Smoking Status Never Smoker  Smokeless Tobacco Never Used     Counseling given: Not Answered   Clinical Intake:  Pre-visit preparation completed: Yes        Diabetes: No  How often do you need to have someone help you when you read instructions, pamphlets, or other written materials from your doctor or pharmacy?: 1 - Never  Interpreter Needed?: No     Past Medical History:  Diagnosis Date  . Allergic rhinitis    seasonal  . Elevated TSH   . Esophagitis   . GERD (gastroesophageal reflux disease)   . History of Helicobacter pylori infection 2012  . Hypercholesterolemia   . Osteopenia   . TIA (transient ischemic attack) 2001   Past Surgical History:  Procedure Laterality Date  . BREAST BIOPSY Right 2000   neg  . BREAST BIOPSY Right 05/18/2017   neg  . BREAST EXCISIONAL BIOPSY Right 1970   NEG  . COLONOSCOPY  2005   normal   . ESOPHAGOGASTRODUODENOSCOPY  2012   normal   . HEMORRHOID SURGERY  1980s   Family History  Problem Relation Age of Onset  . Lymphoma Father   . CVA Mother        h/o cerebral hemorrhage  . Brain cancer Brother   . Cardiomyopathy Sister         enlarged heart, died age 60 - sudden death  . Breast cancer Maternal Aunt        x2  . Colon cancer Neg Hx    Social History   Socioeconomic History  . Marital status: Married    Spouse name: Not on file  . Number of children: 2  . Years of education: Not on file  . Highest education level: Not on file  Occupational History  . Not on file  Tobacco Use  . Smoking status: Never Smoker  . Smokeless tobacco: Never Used  Vaping Use  . Vaping Use: Never used  Substance and Sexual Activity  . Alcohol use: No    Alcohol/week: 0.0 standard drinks  . Drug use: No  . Sexual activity: Yes    Birth control/protection: Post-menopausal, Abstinence  Other Topics Concern  . Not on file  Social History Narrative  . Not on file   Social Determinants of Health   Financial Resource Strain:   . Difficulty of Paying Living Expenses:   Food Insecurity:   . Worried About Charity fundraiser in the Last Year:   . Arboriculturist in the Last Year:   Transportation Needs:   . Film/video editor (Medical):   Marland Kitchen  Lack of Transportation (Non-Medical):   Physical Activity:   . Days of Exercise per Week:   . Minutes of Exercise per Session:   Stress:   . Feeling of Stress :   Social Connections:   . Frequency of Communication with Friends and Family:   . Frequency of Social Gatherings with Friends and Family:   . Attends Religious Services:   . Active Member of Clubs or Organizations:   . Attends Archivist Meetings:   Marland Kitchen Marital Status:     Outpatient Encounter Medications as of 04/07/2020  Medication Sig  . Acetaminophen (TYLENOL ARTHRITIS PAIN PO) Take by mouth 2 (two) times daily.  Marland Kitchen acyclovir (ZOVIRAX) 800 MG tablet TAKE AS DIRECTED  . aspirin 81 MG tablet Take 81 mg by mouth daily.  . Calcium Carbonate-Vit D-Min 600-400 MG-UNIT TABS Take 1 tablet by mouth 2 (two) times daily.  . cetirizine (ZYRTEC) 10 MG tablet Take 10 mg by mouth daily as needed.   . clopidogrel  (PLAVIX) 75 MG tablet TAKE 1 TABLET BY MOUTH EVERY DAY  . famotidine (PEPCID) 20 MG tablet TAKE 1 TABLET BY MOUTH EVERY DAY  . fluticasone (FLONASE) 50 MCG/ACT nasal spray USE 2 SPRAYS EACH NOSTRIL EVERY DAY (Patient taking differently: USE 2 SPRAYS EACH NOSTRIL DAILY AS NEEDED)  . Multiple Vitamin (MULTIVITAMIN) tablet Take 1 tablet by mouth daily.  . pantoprazole (PROTONIX) 40 MG tablet TAKE 1 TABLET BY MOUTH EVERY DAY  . Probiotic Product (PROBIOTIC-10 PO) Take by mouth.  . simvastatin (ZOCOR) 20 MG tablet TAKE 1 TABLET BY MOUTH EVERYDAY AT BEDTIME  . traMADol (ULTRAM) 50 MG tablet 1/2 TO 1 TABLET BY MOUTH TWICE DAILY AS NEEDED   No facility-administered encounter medications on file as of 04/07/2020.    Activities of Daily Living In your present state of health, do you have any difficulty performing the following activities: 04/07/2020  Hearing? N  Vision? N  Difficulty concentrating or making decisions? N  Walking or climbing stairs? N  Dressing or bathing? N  Doing errands, shopping? N  Preparing Food and eating ? N  Using the Toilet? N  In the past six months, have you accidently leaked urine? N  Do you have problems with loss of bowel control? N  Managing your Medications? N  Managing your Finances? N  Housekeeping or managing your Housekeeping? N  Some recent data might be hidden    Patient Care Team: Einar Pheasant, MD as PCP - General (Internal Medicine)    Assessment:   This is a routine wellness examination for Joan Ritter.  I connected with [employer] today by telephone and verified that I am speaking with the correct person using two identifiers. Location patient: home Location provider: work Persons participating in the virtual visit: patient, Marine scientist.    I discussed the limitations, risks, security and privacy concerns of performing an evaluation and management service by telephone and the availability of in person appointments. I also discussed with the patient that there  may be a patient responsible charge related to this service. The patient expressed understanding and verbally consented to this telephonic visit.    Interactive audio and video telecommunications were attempted between this provider and patient, however failed, due to patient having technical difficulties OR patient did not have access to video capability.  We continued and completed visit with audio only.  Some vital signs may be absent or patient reported.   Health Maintenance Due: -Tdap vaccine- discussed; to be completed with doctor in  visit or local pharmacy.   -Dexa Scan- scheduled 05/18/20 -Mammogram- scheduled 05/18/20 See completed HM at the end of note.   Eye: Visual acuity not assessed. Virtual visit. Followed by their ophthalmologist.  Dental: Visits every 6 months.    Hearing: Demonstrates normal hearing during visit.  Safety:  Patient feels safe at home- yes Patient does have smoke detectors at home- yes Patient does wear sunscreen or protective clothing when in direct sunlight - yes Patient does wear seat belt when in a moving vehicle - yes Patient drives- yes Adequate lighting in walkways free from debris- yes Grab bars and handrails used as appropriate- yes Ambulates with an assistive device- no  Social: Alcohol intake - no  Smoking history- never   Smokers in home? none Illicit drug use? none  Medication: Taking as directed and without issues.  Self managed - yes   Covid-19: Precautions and sickness symptoms discussed. Wears mask, social distancing, hand hygiene as appropriate.   Activities of Daily Living Patient denies needing assistance with: household chores, feeding themselves, getting from bed to chair, getting to the toilet, bathing/showering, dressing, managing money, or preparing meals.   Discussed the importance of a healthy diet, water intake and the benefits of aerobic exercise.  Physical activity- walking, online exercises 5 days weekly, 30  minutes  Diet:  Regular Water: good intake Caffeine: 1 cup of coffee  Other Providers Patient Care Team: Einar Pheasant, MD as PCP - General (Internal Medicine) Exercise Activities and Dietary recommendations Current Exercise Habits: Home exercise routine, Type of exercise: walking, Intensity: Mild  Goals    . Maintain Healthy Lifestyle     Stay active Healthy diet        Fall Risk Fall Risk  04/07/2020 03/25/2019 02/22/2017 10/09/2016 04/21/2016  Falls in the past year? 0 1 No No No  Number falls in past yr: 0 - - - -  Injury with Fall? - 1 - - -  Comment - She sought medical care afterwards.  Followed by pcp.  - - -  Follow up Falls evaluation completed - - - -   Is the patient's home free of loose throw rugs in walkways, pet beds, electrical cords, etc?   Yes      Grab bars in the bathroom? Yes      Handrails on the stairs?  Yes      Adequate lighting?  Yes  Timed Get Up and Go performed: No, virtual visit  Depression Screen PHQ 2/9 Scores 04/07/2020 03/25/2019 02/22/2017 10/09/2016  PHQ - 2 Score 0 0 0 0  PHQ- 9 Score - - 0 -     Cognitive Function Patient is alert and oriented x3. Patient denies difficulty focusing or concentrating. Patient likes to play brain challenging games for brain health.   MMSE - Mini Mental State Exam 02/22/2017  Orientation to time 5  Orientation to Place 5  Registration 3  Attention/ Calculation 5  Recall 3  Language- name 2 objects 2  Language- repeat 1  Language- follow 3 step command 3  Language- read & follow direction 1  Write a sentence 1  Copy design 1  Total score 30     6CIT Screen 04/07/2020 03/25/2019  What Year? 0 points 0 points  What month? 0 points 0 points  What time? - 0 points  Count back from 20 - 0 points  Months in reverse 0 points 0 points  Repeat phrase 0 points 0 points  Total Score - 0  Immunization History  Administered Date(s) Administered  . Influenza Split 08/18/2014  . Influenza, High Dose  Seasonal PF 06/28/2017, 07/03/2018, 06/12/2019  . Influenza-Unspecified 07/23/2013, 09/01/2015, 07/26/2016  . PFIZER SARS-COV-2 Vaccination 11/12/2019, 12/03/2019  . Pneumococcal Conjugate-13 05/01/2014  . Pneumococcal Polysaccharide-23 08/21/2017  . Zoster 10/23/2005  . Zoster Recombinat (Shingrix) 02/22/2017, 06/28/2017   Screening Tests Health Maintenance  Topic Date Due  . Hepatitis C Screening  Never done  . TETANUS/TDAP  Never done  . MAMMOGRAM  05/14/2020  . INFLUENZA VACCINE  05/23/2020  . COLONOSCOPY  08/10/2024  . DEXA SCAN  Completed  . COVID-19 Vaccine  Completed  . PNA vac Low Risk Adult  Completed    Cancer Screenings: Lung: Low Dose CT Chest recommended if Age 50-80 years, 30 pack-year currently smoking OR have quit w/in 15years. Patient does not qualify.     Plan:   Keep all routine maintenance appointments.   Next scheduled lab 07/21/20 @ 8:00  Follow up 07/26/20 @ 8:00  Medicare Attestation I have personally reviewed: The patient's medical and social history Their use of alcohol, tobacco or illicit drugs Their current medications and supplements The patient's functional ability including ADLs,fall risks, home safety risks, cognitive, and hearing and visual impairment Diet and physical activities Evidence for depression   I have reviewed and discussed with patient certain preventive protocols, quality metrics, and best practice recommendations.      OBrien-Blaney, Niccole Witthuhn L, LPN  11/02/5518     I have reviewed the above information and agree with above.   Deborra Medina, MD

## 2020-04-07 NOTE — Patient Instructions (Addendum)
  Joan Ritter , Thank you for taking time to come for your Medicare Wellness Visit. I appreciate your ongoing commitment to your health goals. Please review the following plan we discussed and let me know if I can assist you in the future.   These are the goals we discussed: Goals    . Maintain Healthy Lifestyle     Stay active Healthy diet        This is a list of the screening recommended for you and due dates:  Health Maintenance  Topic Date Due  .  Hepatitis C: One time screening is recommended by Center for Disease Control  (CDC) for  adults born from [birth year] through 1965.   Never done  . Tetanus Vaccine  Never done  . Mammogram  05/14/2020  . Flu Shot  05/23/2020  . Colon Cancer Screening  08/10/2024  . DEXA scan (bone density measurement)  Completed  . COVID-19 Vaccine  Completed  . Pneumonia vaccines  Completed

## 2020-04-22 ENCOUNTER — Other Ambulatory Visit: Payer: Self-pay | Admitting: Internal Medicine

## 2020-04-23 ENCOUNTER — Telehealth: Payer: Self-pay | Admitting: Internal Medicine

## 2020-04-23 MED ORDER — PANTOPRAZOLE SODIUM 40 MG PO TBEC: 40.0000 mg | DELAYED_RELEASE_TABLET | Freq: Every day | ORAL | 1 refills | Status: DC

## 2020-04-23 NOTE — Telephone Encounter (Signed)
Pt called in for refill on pantoprazole (PROTONIX) 40 MG tablet she is going out of town Sunday wanted to get her medication refill she only have 2 pills left

## 2020-05-18 ENCOUNTER — Other Ambulatory Visit: Payer: Medicare PPO

## 2020-05-18 ENCOUNTER — Ambulatory Visit
Admission: RE | Admit: 2020-05-18 | Discharge: 2020-05-18 | Disposition: A | Discharge status: Home / Self Care | Payer: Medicare PPO | Source: Ambulatory Visit | Attending: Internal Medicine | Admitting: Internal Medicine

## 2020-05-18 DIAGNOSIS — Z1231 Encounter for screening mammogram for malignant neoplasm of breast: Secondary | ICD-10-CM | POA: Diagnosis not present

## 2020-05-18 DIAGNOSIS — E2839 Other primary ovarian failure: Secondary | ICD-10-CM | POA: Insufficient documentation

## 2020-05-18 DIAGNOSIS — M85851 Other specified disorders of bone density and structure, right thigh: Secondary | ICD-10-CM | POA: Diagnosis not present

## 2020-05-18 DIAGNOSIS — Z780 Asymptomatic menopausal state: Secondary | ICD-10-CM | POA: Diagnosis not present

## 2020-05-19 ENCOUNTER — Other Ambulatory Visit: Payer: Self-pay | Admitting: Internal Medicine

## 2020-05-19 DIAGNOSIS — R928 Other abnormal and inconclusive findings on diagnostic imaging of breast: Secondary | ICD-10-CM

## 2020-05-19 DIAGNOSIS — N6489 Other specified disorders of breast: Secondary | ICD-10-CM

## 2020-05-20 ENCOUNTER — Other Ambulatory Visit: Payer: Self-pay | Admitting: Internal Medicine

## 2020-05-20 NOTE — Progress Notes (Signed)
Opened in error

## 2020-05-26 ENCOUNTER — Ambulatory Visit
Admission: RE | Admit: 2020-05-26 | Discharge: 2020-05-26 | Disposition: A | Discharge status: Home / Self Care | Payer: Medicare PPO | Source: Ambulatory Visit | Attending: Internal Medicine | Admitting: Internal Medicine

## 2020-05-26 DIAGNOSIS — N6489 Other specified disorders of breast: Secondary | ICD-10-CM

## 2020-05-26 DIAGNOSIS — R928 Other abnormal and inconclusive findings on diagnostic imaging of breast: Secondary | ICD-10-CM | POA: Diagnosis not present

## 2020-05-26 DIAGNOSIS — R922 Inconclusive mammogram: Secondary | ICD-10-CM | POA: Diagnosis not present

## 2020-05-26 DIAGNOSIS — N6011 Diffuse cystic mastopathy of right breast: Secondary | ICD-10-CM | POA: Diagnosis not present

## 2020-05-28 ENCOUNTER — Other Ambulatory Visit: Payer: Self-pay | Admitting: Internal Medicine

## 2020-05-28 DIAGNOSIS — R928 Other abnormal and inconclusive findings on diagnostic imaging of breast: Secondary | ICD-10-CM

## 2020-05-28 NOTE — Progress Notes (Unsigned)
Order placed for surgery referral.  

## 2020-06-10 DIAGNOSIS — N6002 Solitary cyst of left breast: Secondary | ICD-10-CM | POA: Diagnosis not present

## 2020-07-21 ENCOUNTER — Other Ambulatory Visit (INDEPENDENT_AMBULATORY_CARE_PROVIDER_SITE_OTHER): Payer: Medicare PPO

## 2020-07-21 ENCOUNTER — Other Ambulatory Visit: Payer: Self-pay

## 2020-07-21 DIAGNOSIS — E7800 Pure hypercholesterolemia, unspecified: Secondary | ICD-10-CM

## 2020-07-21 LAB — CBC WITH DIFFERENTIAL/PLATELET
Basophils Absolute: 0.1 10*3/uL (ref 0.0–0.1)
Basophils Relative: 1 % (ref 0.0–3.0)
Eosinophils Absolute: 0.3 10*3/uL (ref 0.0–0.7)
Eosinophils Relative: 4.1 % (ref 0.0–5.0)
HCT: 41.5 % (ref 36.0–46.0)
Hemoglobin: 14.2 g/dL (ref 12.0–15.0)
Lymphocytes Relative: 35.6 % (ref 12.0–46.0)
Lymphs Abs: 2.2 10*3/uL (ref 0.7–4.0)
MCHC: 34.2 g/dL (ref 30.0–36.0)
MCV: 95.8 fl (ref 78.0–100.0)
Monocytes Absolute: 0.5 10*3/uL (ref 0.1–1.0)
Monocytes Relative: 9 % (ref 3.0–12.0)
Neutro Abs: 3.1 10*3/uL (ref 1.4–7.7)
Neutrophils Relative %: 50.3 % (ref 43.0–77.0)
Platelets: 193 10*3/uL (ref 150.0–400.0)
RBC: 4.34 Mil/uL (ref 3.87–5.11)
RDW: 12.6 % (ref 11.5–15.5)
WBC: 6.1 10*3/uL (ref 4.0–10.5)

## 2020-07-21 LAB — HEPATIC FUNCTION PANEL
ALT: 17 U/L (ref 0–35)
AST: 18 U/L (ref 0–37)
Albumin: 4 g/dL (ref 3.5–5.2)
Alkaline Phosphatase: 49 U/L (ref 39–117)
Bilirubin, Direct: 0.1 mg/dL (ref 0.0–0.3)
Total Bilirubin: 0.5 mg/dL (ref 0.2–1.2)
Total Protein: 6.1 g/dL (ref 6.0–8.3)

## 2020-07-21 LAB — BASIC METABOLIC PANEL
BUN: 16 mg/dL (ref 6–23)
CO2: 32 mEq/L (ref 19–32)
Calcium: 8.9 mg/dL (ref 8.4–10.5)
Chloride: 106 mEq/L (ref 96–112)
Creatinine, Ser: 0.94 mg/dL (ref 0.40–1.20)
GFR: 58.11 mL/min — ABNORMAL LOW (ref >60.00)
Glucose, Bld: 88 mg/dL (ref 70–99)
Potassium: 4.1 mEq/L (ref 3.5–5.1)
Sodium: 142 mEq/L (ref 135–145)

## 2020-07-21 LAB — TSH: TSH: 3.73 u[IU]/mL (ref 0.35–4.50)

## 2020-07-21 LAB — LIPID PANEL
Cholesterol: 177 mg/dL (ref 0–200)
HDL: 71.3 mg/dL (ref >39.00)
LDL Cholesterol: 87 mg/dL (ref 0–99)
NonHDL: 105.26
Total CHOL/HDL Ratio: 2
Triglycerides: 92 mg/dL (ref 0.0–149.0)
VLDL: 18.4 mg/dL (ref 0.0–40.0)

## 2020-07-26 ENCOUNTER — Ambulatory Visit: Payer: Medicare PPO | Admitting: Internal Medicine

## 2020-07-29 ENCOUNTER — Other Ambulatory Visit: Payer: Self-pay

## 2020-07-29 ENCOUNTER — Ambulatory Visit: Payer: Medicare PPO | Admitting: Internal Medicine

## 2020-07-29 DIAGNOSIS — R928 Other abnormal and inconclusive findings on diagnostic imaging of breast: Secondary | ICD-10-CM

## 2020-07-29 DIAGNOSIS — Z8673 Personal history of transient ischemic attack (TIA), and cerebral infarction without residual deficits: Secondary | ICD-10-CM

## 2020-07-29 DIAGNOSIS — E7800 Pure hypercholesterolemia, unspecified: Secondary | ICD-10-CM | POA: Diagnosis not present

## 2020-07-29 NOTE — Progress Notes (Signed)
Patient ID: Joan Ritter, female   DOB: 14-Jun-1946, 74 y.o.   MRN: [id number]   Subjective:    Patient ID: Joan Ritter, female    DOB: Sep 19, 1946, 74 y.o.   MRN: [id number]  HPI This visit occurred during the SARS-CoV-2 public health emergency.  Safety protocols were in place, including screening questions prior to the visit, additional usage of staff PPE, and extensive cleaning of exam room while observing appropriate contact time as indicated for disinfecting solutions.  Patient here for a scheduled follow up.  Here to follow up regarding her cholesterol.  Also had recent abnormal mammogram.  F/u views - 05/26/20 - revealed what appeared to be cluster of cysts in the right breast.  Referred to Dr Bary Castilla for evaluation.  Recommended f/u right breast mammogram in 6 months.  His office to arrange. She reports she is doing relatively well.  Stays active.  No chest pain or sob reported.  No abdominal pain or bowel change reported.    Past Medical History:  Diagnosis Date  . Allergic rhinitis    seasonal  . Elevated TSH   . Esophagitis   . GERD (gastroesophageal reflux disease)   . History of Helicobacter pylori infection 2012  . Hypercholesterolemia   . Osteopenia   . TIA (transient ischemic attack) 2001   Past Surgical History:  Procedure Laterality Date  . BREAST BIOPSY Right 2000   neg  . BREAST BIOPSY Right 05/18/2017   neg  . BREAST EXCISIONAL BIOPSY Right 1970   NEG  . COLONOSCOPY  2005   normal   . ESOPHAGOGASTRODUODENOSCOPY  2012   normal   . HEMORRHOID SURGERY  1980s   Family History  Problem Relation Age of Onset  . Lymphoma Father   . CVA Mother        h/o cerebral hemorrhage  . Brain cancer Brother   . Cardiomyopathy Sister        enlarged heart, died age 36 - sudden death  . Breast cancer Maternal Aunt        x2  . Colon cancer Neg Hx    Social History   Socioeconomic History  . Marital status: Married    Spouse name: Not on file  . Number of children: 2   . Years of education: Not on file  . Highest education level: Not on file  Occupational History  . Not on file  Tobacco Use  . Smoking status: Never Smoker  . Smokeless tobacco: Never Used  Vaping Use  . Vaping Use: Never used  Substance and Sexual Activity  . Alcohol use: No    Alcohol/week: 0.0 standard drinks  . Drug use: No  . Sexual activity: Yes    Birth control/protection: Post-menopausal, Abstinence  Other Topics Concern  . Not on file  Social History Narrative  . Not on file   Social Determinants of Health   Financial Resource Strain:   . Difficulty of Paying Living Expenses: Not on file  Food Insecurity:   . Worried About Charity fundraiser in the Last Year: Not on file  . Ran Out of Food in the Last Year: Not on file  Transportation Needs:   . Lack of Transportation (Medical): Not on file  . Lack of Transportation (Non-Medical): Not on file  Physical Activity:   . Days of Exercise per Week: Not on file  . Minutes of Exercise per Session: Not on file  Stress:   . Feeling of Stress : Not  on file  Social Connections:   . Frequency of Communication with Friends and Family: Not on file  . Frequency of Social Gatherings with Friends and Family: Not on file  . Attends Religious Services: Not on file  . Active Member of Clubs or Organizations: Not on file  . Attends Archivist Meetings: Not on file  . Marital Status: Not on file    Outpatient Encounter Medications as of 07/29/2020  Medication Sig  . Acetaminophen (TYLENOL ARTHRITIS PAIN PO) Take by mouth 2 (two) times daily.  Marland Kitchen acyclovir (ZOVIRAX) 800 MG tablet TAKE AS DIRECTED  . aspirin 81 MG tablet Take 81 mg by mouth daily.  . Calcium Carbonate-Vit D-Min 600-400 MG-UNIT TABS Take 1 tablet by mouth 2 (two) times daily.  . cetirizine (ZYRTEC) 10 MG tablet Take 10 mg by mouth daily as needed.   . clopidogrel (PLAVIX) 75 MG tablet TAKE 1 TABLET BY MOUTH EVERY DAY  . famotidine (PEPCID) 20 MG tablet  TAKE 1 TABLET BY MOUTH EVERY DAY  . fluticasone (FLONASE) 50 MCG/ACT nasal spray USE 2 SPRAYS EACH NOSTRIL EVERY DAY (Patient taking differently: USE 2 SPRAYS EACH NOSTRIL DAILY AS NEEDED)  . Multiple Vitamin (MULTIVITAMIN) tablet Take 1 tablet by mouth daily.  . pantoprazole (PROTONIX) 40 MG tablet Take 1 tablet (40 mg total) by mouth daily.  . Probiotic Product (PROBIOTIC-10 PO) Take by mouth.  . simvastatin (ZOCOR) 20 MG tablet TAKE 1 TABLET BY MOUTH EVERYDAY AT BEDTIME  . traMADol (ULTRAM) 50 MG tablet 1/2 TO 1 TABLET BY MOUTH TWICE DAILY AS NEEDED   No facility-administered encounter medications on file as of 07/29/2020.    Review of Systems  Constitutional: Negative for appetite change and unexpected weight change.  HENT: Negative for congestion and sinus pressure.   Respiratory: Negative for cough, chest tightness and shortness of breath.   Cardiovascular: Negative for chest pain, palpitations and leg swelling.  Gastrointestinal: Negative for abdominal pain, diarrhea, nausea and vomiting.  Genitourinary: Negative for difficulty urinating and dysuria.  Musculoskeletal: Negative for joint swelling and myalgias.  Skin: Negative for color change and rash.  Neurological: Negative for dizziness, light-headedness and headaches.  Psychiatric/Behavioral: Negative for agitation and dysphoric mood.       Objective:    Physical Exam Vitals reviewed.  Constitutional:      General: She is not in acute distress.    Appearance: Normal appearance.  HENT:     Head: Normocephalic and atraumatic.     Right Ear: External ear normal.  Eyes:     General: No scleral icterus.       Right eye: No discharge.        Left eye: No discharge.     Conjunctiva/sclera: Conjunctivae normal.  Neck:     Thyroid: No thyromegaly.  Cardiovascular:     Rate and Rhythm: Normal rate and regular rhythm.  Pulmonary:     Effort: No respiratory distress.     Breath sounds: Normal breath sounds. No wheezing.    Abdominal:     General: Bowel sounds are normal.     Palpations: Abdomen is soft.     Tenderness: There is no abdominal tenderness.  Musculoskeletal:        General: No swelling or tenderness.     Cervical back: Neck supple. No tenderness.  Lymphadenopathy:     Cervical: No cervical adenopathy.  Skin:    Findings: No erythema or rash.  Neurological:     Mental Status: She is  alert.  Psychiatric:        Mood and Affect: Mood normal.        Behavior: Behavior normal.     BP 122/68   Pulse 64   Temp 98.2 F (36.8 C) (Oral)   Resp 16   Ht 5\' 3"  (1.6 m)   Wt 124 lb (56.2 kg)   SpO2 98%   BMI 21.97 kg/m  Wt Readings from Last 3 Encounters:  07/29/20 124 lb (56.2 kg)  04/07/20 121 lb (54.9 kg)  01/22/20 121 lb 6.4 oz (55.1 kg)     Lab Results  Component Value Date   WBC 6.1 07/21/2020   HGB 14.2 07/21/2020   HCT 41.5 07/21/2020   PLT 193.0 07/21/2020   GLUCOSE 88 07/21/2020   CHOL 177 07/21/2020   TRIG 92.0 07/21/2020   HDL 71.30 07/21/2020   LDLCALC 87 07/21/2020   ALT 17 07/21/2020   AST 18 07/21/2020   NA 142 07/21/2020   K 4.1 07/21/2020   CL 106 07/21/2020   CREATININE 0.94 07/21/2020   BUN 16 07/21/2020   CO2 32 07/21/2020   TSH 3.73 07/21/2020    US BREAST LTD UNI RIGHT INC AXILLA  Addendum Date: 05/26/2020   ADDENDUM REPORT: 05/26/2020 16:03 Electronically Signed   By: Lillia Mountain M.D.   On: 05/26/2020 16:03   Result Date: 05/26/2020 CLINICAL DATA:  Patient was called back from screening mammogram for a possible asymmetry in the right breast. EXAM: DIGITAL DIAGNOSTIC RIGHT MAMMOGRAM WITH TOMO ULTRASOUND RIGHT BREAST COMPARISON:  Previous exam(s). ACR Breast Density Category c: The breast tissue is heterogeneously dense, which may obscure small masses. FINDINGS: Additional imaging of the right breast was performed. There is persistence of an asymmetry in the upper inner quadrant of the right breast on the cc view. Targeted ultrasound is performed, showing  a probable cluster of cysts (apocrine metaplasia) in the right breast at 1 o'clock in the retroareolar region measuring 8 x 4 x 5 mm. This may account for the mammographic abnormality. IMPRESSION: Probable benign findings in the right breast. RECOMMENDATION: Short-term interval follow-up right mammogram and ultrasound 6 months is recommended. I have discussed the findings and recommendations with the patient. If applicable, a reminder letter will be sent to the patient regarding the next appointment. BI-RADS CATEGORY  3: Probably benign. Electronically Signed: By: Lillia Mountain M.D. On: 05/26/2020 13:45   MM DIAG BREAST TOMO UNI RIGHT  Addendum Date: 05/26/2020   ADDENDUM REPORT: 05/26/2020 16:03 Electronically Signed   By: Lillia Mountain M.D.   On: 05/26/2020 16:03   Result Date: 05/26/2020 CLINICAL DATA:  Patient was called back from screening mammogram for a possible asymmetry in the right breast. EXAM: DIGITAL DIAGNOSTIC RIGHT MAMMOGRAM WITH TOMO ULTRASOUND RIGHT BREAST COMPARISON:  Previous exam(s). ACR Breast Density Category c: The breast tissue is heterogeneously dense, which may obscure small masses. FINDINGS: Additional imaging of the right breast was performed. There is persistence of an asymmetry in the upper inner quadrant of the right breast on the cc view. Targeted ultrasound is performed, showing a probable cluster of cysts (apocrine metaplasia) in the right breast at 1 o'clock in the retroareolar region measuring 8 x 4 x 5 mm. This may account for the mammographic abnormality. IMPRESSION: Probable benign findings in the right breast. RECOMMENDATION: Short-term interval follow-up right mammogram and ultrasound 6 months is recommended. I have discussed the findings and recommendations with the patient. If applicable, a reminder letter will be sent to  the patient regarding the next appointment. BI-RADS CATEGORY  3: Probably benign. Electronically Signed: By: Lillia Mountain M.D. On: 05/26/2020 13:45        Assessment & Plan:   Problem List Items Addressed This Visit    Hypercholesterolemia    On simvastatin.  Low cholesterol diet and exercise.  Discussed recent labs.  Continue current medication regimen.   Lab Results  Component Value Date   CHOL 177 07/21/2020   HDL 71.30 07/21/2020   LDLCALC 87 07/21/2020   TRIG 92.0 07/21/2020   CHOLHDL 2 07/21/2020        Relevant Orders   Hepatic function panel   Lipid panel   Basic metabolic panel   History of TIA (transient ischemic attack)    Doing well on plavix.  Follow.       Abnormal mammogram    Recent abnormal mammogram.  Evaluated by Dr Bary Castilla.  Recommended 6 months f/u mammogram.  His office to arrange.            Einar Pheasant, MD

## 2020-08-08 ENCOUNTER — Encounter: Payer: Self-pay | Admitting: Internal Medicine

## 2020-08-08 DIAGNOSIS — R928 Other abnormal and inconclusive findings on diagnostic imaging of breast: Secondary | ICD-10-CM | POA: Insufficient documentation

## 2020-08-08 NOTE — Assessment & Plan Note (Signed)
Doing well on plavix.  Follow.

## 2020-08-08 NOTE — Assessment & Plan Note (Signed)
Recent abnormal mammogram.  Evaluated by Dr Bary Castilla.  Recommended 6 months f/u mammogram.  His office to arrange.

## 2020-08-08 NOTE — Assessment & Plan Note (Signed)
On simvastatin.  Low cholesterol diet and exercise.  Discussed recent labs.  Continue current medication regimen.   Lab Results  Component Value Date   CHOL 177 07/21/2020   HDL 71.30 07/21/2020   LDLCALC 87 07/21/2020   TRIG 92.0 07/21/2020   CHOLHDL 2 07/21/2020

## 2020-08-12 ENCOUNTER — Other Ambulatory Visit: Payer: Self-pay | Admitting: Internal Medicine

## 2020-09-08 DIAGNOSIS — Z7902 Long term (current) use of antithrombotics/antiplatelets: Secondary | ICD-10-CM | POA: Diagnosis not present

## 2020-09-08 DIAGNOSIS — I252 Old myocardial infarction: Secondary | ICD-10-CM | POA: Diagnosis not present

## 2020-09-08 DIAGNOSIS — Z809 Family history of malignant neoplasm, unspecified: Secondary | ICD-10-CM | POA: Diagnosis not present

## 2020-09-08 DIAGNOSIS — K219 Gastro-esophageal reflux disease without esophagitis: Secondary | ICD-10-CM | POA: Diagnosis not present

## 2020-09-08 DIAGNOSIS — E785 Hyperlipidemia, unspecified: Secondary | ICD-10-CM | POA: Diagnosis not present

## 2020-09-08 DIAGNOSIS — Z8249 Family history of ischemic heart disease and other diseases of the circulatory system: Secondary | ICD-10-CM | POA: Diagnosis not present

## 2020-09-12 ENCOUNTER — Other Ambulatory Visit: Payer: Self-pay | Admitting: Internal Medicine

## 2020-10-06 DIAGNOSIS — Z961 Presence of intraocular lens: Secondary | ICD-10-CM | POA: Diagnosis not present

## 2020-10-06 DIAGNOSIS — H26491 Other secondary cataract, right eye: Secondary | ICD-10-CM | POA: Diagnosis not present

## 2020-10-14 ENCOUNTER — Other Ambulatory Visit: Payer: Self-pay | Admitting: Internal Medicine

## 2020-10-14 DIAGNOSIS — N6489 Other specified disorders of breast: Secondary | ICD-10-CM

## 2020-11-07 ENCOUNTER — Other Ambulatory Visit: Payer: Self-pay | Admitting: Internal Medicine

## 2020-11-16 DIAGNOSIS — D225 Melanocytic nevi of trunk: Secondary | ICD-10-CM | POA: Diagnosis not present

## 2020-11-16 DIAGNOSIS — Z86018 Personal history of other benign neoplasm: Secondary | ICD-10-CM | POA: Diagnosis not present

## 2020-11-16 DIAGNOSIS — C44519 Basal cell carcinoma of skin of other part of trunk: Secondary | ICD-10-CM | POA: Diagnosis not present

## 2020-11-16 DIAGNOSIS — Z85828 Personal history of other malignant neoplasm of skin: Secondary | ICD-10-CM | POA: Diagnosis not present

## 2020-11-16 DIAGNOSIS — L578 Other skin changes due to chronic exposure to nonionizing radiation: Secondary | ICD-10-CM | POA: Diagnosis not present

## 2020-11-16 DIAGNOSIS — D485 Neoplasm of uncertain behavior of skin: Secondary | ICD-10-CM | POA: Diagnosis not present

## 2020-12-07 ENCOUNTER — Ambulatory Visit
Admission: RE | Admit: 2020-12-07 | Discharge: 2020-12-07 | Disposition: A | Discharge status: Home / Self Care | Payer: Medicare PPO | Source: Ambulatory Visit | Attending: Internal Medicine | Admitting: Internal Medicine

## 2020-12-07 ENCOUNTER — Other Ambulatory Visit: Payer: Self-pay

## 2020-12-07 ENCOUNTER — Other Ambulatory Visit: Payer: Self-pay | Admitting: Internal Medicine

## 2020-12-07 DIAGNOSIS — N6489 Other specified disorders of breast: Secondary | ICD-10-CM | POA: Insufficient documentation

## 2020-12-07 DIAGNOSIS — R928 Other abnormal and inconclusive findings on diagnostic imaging of breast: Secondary | ICD-10-CM

## 2020-12-07 DIAGNOSIS — N6312 Unspecified lump in the right breast, upper inner quadrant: Secondary | ICD-10-CM | POA: Diagnosis not present

## 2020-12-07 NOTE — Progress Notes (Signed)
Order placed for bilateral diagnostic mammogram and right breast ultrasound.

## 2020-12-09 DIAGNOSIS — C44519 Basal cell carcinoma of skin of other part of trunk: Secondary | ICD-10-CM | POA: Diagnosis not present

## 2020-12-09 DIAGNOSIS — C4491 Basal cell carcinoma of skin, unspecified: Secondary | ICD-10-CM | POA: Diagnosis not present

## 2020-12-20 DIAGNOSIS — H26491 Other secondary cataract, right eye: Secondary | ICD-10-CM | POA: Diagnosis not present

## 2020-12-20 DIAGNOSIS — H18593 Other hereditary corneal dystrophies, bilateral: Secondary | ICD-10-CM | POA: Diagnosis not present

## 2020-12-20 DIAGNOSIS — H26493 Other secondary cataract, bilateral: Secondary | ICD-10-CM | POA: Diagnosis not present

## 2021-01-26 ENCOUNTER — Other Ambulatory Visit (INDEPENDENT_AMBULATORY_CARE_PROVIDER_SITE_OTHER): Payer: Medicare PPO

## 2021-01-26 ENCOUNTER — Other Ambulatory Visit: Payer: Self-pay

## 2021-01-26 DIAGNOSIS — E7800 Pure hypercholesterolemia, unspecified: Secondary | ICD-10-CM | POA: Diagnosis not present

## 2021-01-26 LAB — HEPATIC FUNCTION PANEL
ALT: 14 U/L (ref 0–35)
AST: 16 U/L (ref 0–37)
Albumin: 3.9 g/dL (ref 3.5–5.2)
Alkaline Phosphatase: 47 U/L (ref 39–117)
Bilirubin, Direct: 0.1 mg/dL (ref 0.0–0.3)
Total Bilirubin: 0.5 mg/dL (ref 0.2–1.2)
Total Protein: 6.1 g/dL (ref 6.0–8.3)

## 2021-01-26 LAB — LIPID PANEL
Cholesterol: 163 mg/dL (ref 0–200)
HDL: 66.3 mg/dL (ref >39.00)
LDL Cholesterol: 78 mg/dL (ref 0–99)
NonHDL: 96.25
Total CHOL/HDL Ratio: 2
Triglycerides: 92 mg/dL (ref 0.0–149.0)
VLDL: 18.4 mg/dL (ref 0.0–40.0)

## 2021-01-26 LAB — BASIC METABOLIC PANEL
BUN: 15 mg/dL (ref 6–23)
CO2: 30 mEq/L (ref 19–32)
Calcium: 9.1 mg/dL (ref 8.4–10.5)
Chloride: 105 mEq/L (ref 96–112)
Creatinine, Ser: 0.96 mg/dL (ref 0.40–1.20)
GFR: 58.02 mL/min — ABNORMAL LOW (ref >60.00)
Glucose, Bld: 94 mg/dL (ref 70–99)
Potassium: 4 mEq/L (ref 3.5–5.1)
Sodium: 141 mEq/L (ref 135–145)

## 2021-01-27 ENCOUNTER — Encounter: Payer: Self-pay | Admitting: Internal Medicine

## 2021-01-27 ENCOUNTER — Ambulatory Visit (INDEPENDENT_AMBULATORY_CARE_PROVIDER_SITE_OTHER): Payer: Medicare PPO | Admitting: Internal Medicine

## 2021-01-27 VITALS — BP 110/68 | HR 86 | Temp 97.8°F | Resp 16 | Ht 63.0 in | Wt 126.0 lb

## 2021-01-27 DIAGNOSIS — Z8673 Personal history of transient ischemic attack (TIA), and cerebral infarction without residual deficits: Secondary | ICD-10-CM

## 2021-01-27 DIAGNOSIS — K219 Gastro-esophageal reflux disease without esophagitis: Secondary | ICD-10-CM

## 2021-01-27 DIAGNOSIS — Z0000 Encounter for general adult medical examination without abnormal findings: Secondary | ICD-10-CM

## 2021-01-27 DIAGNOSIS — E7800 Pure hypercholesterolemia, unspecified: Secondary | ICD-10-CM

## 2021-01-27 MED ORDER — ACYCLOVIR 800 MG PO TABS: 800.0000 mg | ORAL_TABLET | ORAL | 0 refills | Status: DC

## 2021-01-27 NOTE — Assessment & Plan Note (Signed)
Physical today 01/27/21.  Mammogram 12/07/20 - Birads III.  Recommended bilateral diagnostic mammogram with right breast ultrasound in 6 months.  colonoscpy 07/2014 - normal.

## 2021-01-27 NOTE — Progress Notes (Signed)
Patient ID: GOLDA ZAVALZA, female   DOB: 1946-01-13, 75 y.o.   MRN: [id number]   Subjective:    Patient ID: Joan Ritter, female    DOB: 06-15-1946, 75 y.o.   MRN: [id number]  HPI This visit occurred during the SARS-CoV-2 public health emergency.  Safety protocols were in place, including screening questions prior to the visit, additional usage of staff PPE, and extensive cleaning of exam room while observing appropriate contact time as indicated for disinfecting solutions.  Patient here for her physical exam. She reports she is doing well.  Feels good.  Stays active.  Doing zoom and silver sneakers.  No chest pain or sob reported.  No abdominal pain or bowel change reported.  No cough or congestion.     Past Medical History:  Diagnosis Date  . Allergic rhinitis    seasonal  . Elevated TSH   . Esophagitis   . GERD (gastroesophageal reflux disease)   . History of Helicobacter pylori infection 2012  . Hypercholesterolemia   . Osteopenia   . TIA (transient ischemic attack) 2001   Past Surgical History:  Procedure Laterality Date  . BREAST BIOPSY Right 2000   neg  . BREAST BIOPSY Right 05/18/2017   adipose tissue, coil marker  . BREAST EXCISIONAL BIOPSY Right 1970   NEG  . COLONOSCOPY  2005   normal   . ESOPHAGOGASTRODUODENOSCOPY  2012   normal   . HEMORRHOID SURGERY  1980s   Family History  Problem Relation Age of Onset  . Lymphoma Father   . CVA Mother        h/o cerebral hemorrhage  . Brain cancer Brother   . Cardiomyopathy Sister        enlarged heart, died age 73 - sudden death  . Breast cancer Maternal Aunt        x2  . Colon cancer Neg Hx    Social History   Socioeconomic History  . Marital status: Married    Spouse name: Not on file  . Number of children: 2  . Years of education: Not on file  . Highest education level: Not on file  Occupational History  . Not on file  Tobacco Use  . Smoking status: Never Smoker  . Smokeless tobacco: Never Used  Vaping  Use  . Vaping Use: Never used  Substance and Sexual Activity  . Alcohol use: No    Alcohol/week: 0.0 standard drinks  . Drug use: No  . Sexual activity: Yes    Birth control/protection: Post-menopausal, Abstinence  Other Topics Concern  . Not on file  Social History Narrative  . Not on file   Social Determinants of Health   Financial Resource Strain: Not on file  Food Insecurity: Not on file  Transportation Needs: Not on file  Physical Activity: Not on file  Stress: Not on file  Social Connections: Not on file    Outpatient Encounter Medications as of 01/27/2021  Medication Sig  . Acetaminophen (TYLENOL ARTHRITIS PAIN PO) Take by mouth 2 (two) times daily.  Marland Kitchen acyclovir (ZOVIRAX) 800 MG tablet Take 1 tablet (800 mg total) by mouth as directed.  Marland Kitchen aspirin 81 MG tablet Take 81 mg by mouth daily.  . Calcium Carbonate-Vit D-Min 600-400 MG-UNIT TABS Take 1 tablet by mouth 2 (two) times daily.  . cetirizine (ZYRTEC) 10 MG tablet Take 10 mg by mouth daily as needed.   . clopidogrel (PLAVIX) 75 MG tablet TAKE 1 TABLET BY MOUTH EVERY DAY  .  fluticasone (FLONASE) 50 MCG/ACT nasal spray USE 2 SPRAYS EACH NOSTRIL EVERY DAY (Patient taking differently: USE 2 SPRAYS EACH NOSTRIL DAILY AS NEEDED)  . Multiple Vitamin (MULTIVITAMIN) tablet Take 1 tablet by mouth daily.  . pantoprazole (PROTONIX) 40 MG tablet Take 1 tablet (40 mg total) by mouth daily.  . Probiotic Product (PROBIOTIC-10 PO) Take by mouth.  . simvastatin (ZOCOR) 20 MG tablet TAKE 1 TABLET BY MOUTH EVERYDAY AT BEDTIME  . [DISCONTINUED] acyclovir (ZOVIRAX) 800 MG tablet TAKE AS DIRECTED  . [DISCONTINUED] famotidine (PEPCID) 20 MG tablet TAKE 1 TABLET BY MOUTH EVERY DAY  . [DISCONTINUED] traMADol (ULTRAM) 50 MG tablet 1/2 TO 1 TABLET BY MOUTH TWICE DAILY AS NEEDED   No facility-administered encounter medications on file as of 01/27/2021.    Review of Systems  Constitutional: Negative for appetite change and unexpected weight change.   HENT: Negative for congestion, sinus pressure and sore throat.   Eyes: Negative for pain and visual disturbance.  Respiratory: Negative for cough, chest tightness and shortness of breath.   Cardiovascular: Negative for chest pain, palpitations and leg swelling.  Gastrointestinal: Negative for abdominal pain, diarrhea, nausea and vomiting.  Genitourinary: Negative for difficulty urinating and dysuria.  Musculoskeletal: Negative for joint swelling and myalgias.  Skin: Negative for color change and rash.  Neurological: Negative for dizziness, light-headedness and headaches.  Hematological: Negative for adenopathy. Does not bruise/bleed easily.  Psychiatric/Behavioral: Negative for agitation and dysphoric mood.       Objective:    Physical Exam Vitals reviewed.  Constitutional:      General: She is not in acute distress.    Appearance: Normal appearance. She is well-developed.  HENT:     Head: Normocephalic and atraumatic.     Right Ear: External ear normal.     Left Ear: External ear normal.  Eyes:     General: No scleral icterus.       Right eye: No discharge.        Left eye: No discharge.     Conjunctiva/sclera: Conjunctivae normal.  Neck:     Thyroid: No thyromegaly.  Cardiovascular:     Rate and Rhythm: Normal rate and regular rhythm.  Pulmonary:     Effort: No tachypnea, accessory muscle usage or respiratory distress.     Breath sounds: Normal breath sounds. No decreased breath sounds or wheezing.  Chest:  Breasts:     Right: No inverted nipple, mass, nipple discharge or tenderness (no axillary adenopathy).     Left: No inverted nipple, mass, nipple discharge or tenderness (no axilarry adenopathy).    Abdominal:     General: Bowel sounds are normal.     Palpations: Abdomen is soft.     Tenderness: There is no abdominal tenderness.  Musculoskeletal:        General: No swelling or tenderness.     Cervical back: Neck supple. No tenderness.  Lymphadenopathy:      Cervical: No cervical adenopathy.  Skin:    Findings: No erythema or rash.  Neurological:     Mental Status: She is alert and oriented to person, place, and time.  Psychiatric:        Mood and Affect: Mood normal.        Behavior: Behavior normal.     BP 110/68   Pulse 86   Temp 97.8 F (36.6 C) (Oral)   Resp 16   Ht 5\' 3"  (1.6 m)   Wt 126 lb (57.2 kg)   SpO2 99%  BMI 22.32 kg/m  Wt Readings from Last 3 Encounters:  01/27/21 126 lb (57.2 kg)  07/29/20 124 lb (56.2 kg)  04/07/20 121 lb (54.9 kg)     Lab Results  Component Value Date   WBC 6.1 07/21/2020   HGB 14.2 07/21/2020   HCT 41.5 07/21/2020   PLT 193.0 07/21/2020   GLUCOSE 94 01/26/2021   CHOL 163 01/26/2021   TRIG 92.0 01/26/2021   HDL 66.30 01/26/2021   LDLCALC 78 01/26/2021   ALT 14 01/26/2021   AST 16 01/26/2021   NA 141 01/26/2021   K 4.0 01/26/2021   CL 105 01/26/2021   CREATININE 0.96 01/26/2021   BUN 15 01/26/2021   CO2 30 01/26/2021   TSH 3.73 07/21/2020    US BREAST LTD UNI RIGHT INC AXILLA  Result Date: 12/07/2020 CLINICAL DATA:  First six-month follow-up for probably benign RIGHT breast asymmetry. Today the patient reports a stable mass in the RIGHT breast. Ultrasound-guided core biopsy of this region was performed in 2018, showing benign adipose tissue in the 2 o'clock location. EXAM: DIGITAL DIAGNOSTIC UNILATERAL RIGHT MAMMOGRAM WITH TOMOSYNTHESIS AND CAD; ULTRASOUND RIGHT BREAST LIMITED TECHNIQUE: Right digital diagnostic mammography and breast tomosynthesis was performed. The images were evaluated with computer-aided detection.; Targeted ultrasound examination of the right breast was performed COMPARISON:  05/26/2020 and earlier ACR Breast Density Category c: The breast tissue is heterogeneously dense, which may obscure small masses. FINDINGS: A BB marks area of patient's concern, adjacent to a coil shaped clip placed the time of biopsy showing benign adipose tissue on 05/18/2017. No mass is  identified in this region. The asymmetry previously noted in the superior portion of the RIGHT breast is significantly less apparent. No new or suspicious findings in the RIGHT breast. On physical exam, I palpate soft focal thickening in the area of patient's concern, in the 2 o'clock location of the LEFT breast. Patient states the finding is stable to her exam. Targeted ultrasound is performed, showing focal hyperechoic tissue and tissue marker clip in the 2 o'clock location of RIGHT breast 12 centimeters from the nipple corresponding to the area of patient's concern. This area measures 1.8 x 0.7 x 1.4 centimeters. In the retroareolar region 1 o'clock location, a group of hypoechoic circumscribed oval parallel mass is 0.8 x 0.3 x 0.4 centimeters. The appearance is stable. IMPRESSION: 1.  No mammographic or ultrasound evidence for malignancy. 2. Stable appearance of hypoechoic masses in the retroareolar 1 o'clock location of the RIGHT breast. 3. Asymmetry in the superior portion of the RIGHT breast is significantly less apparent. 4. Biopsy-proven adipose tissue corresponds to clinically stable focal soft thickening in the 2 o'clock location of the RIGHT breast. RECOMMENDATION: Bilateral diagnostic mammogram and RIGHT breast ultrasound in 6 months. I have discussed the findings and recommendations with the patient. If applicable, a reminder letter will be sent to the patient regarding the next appointment. BI-RADS CATEGORY  3: Probably benign. Electronically Signed   By: Nolon Nations M.D.   On: 12/07/2020 14:54   MM DIAG BREAST TOMO UNI RIGHT  Result Date: 12/07/2020 CLINICAL DATA:  First six-month follow-up for probably benign RIGHT breast asymmetry. Today the patient reports a stable mass in the RIGHT breast. Ultrasound-guided core biopsy of this region was performed in 2018, showing benign adipose tissue in the 2 o'clock location. EXAM: DIGITAL DIAGNOSTIC UNILATERAL RIGHT MAMMOGRAM WITH TOMOSYNTHESIS AND  CAD; ULTRASOUND RIGHT BREAST LIMITED TECHNIQUE: Right digital diagnostic mammography and breast tomosynthesis was performed. The images were  evaluated with computer-aided detection.; Targeted ultrasound examination of the right breast was performed COMPARISON:  05/26/2020 and earlier ACR Breast Density Category c: The breast tissue is heterogeneously dense, which may obscure small masses. FINDINGS: A BB marks area of patient's concern, adjacent to a coil shaped clip placed the time of biopsy showing benign adipose tissue on 05/18/2017. No mass is identified in this region. The asymmetry previously noted in the superior portion of the RIGHT breast is significantly less apparent. No new or suspicious findings in the RIGHT breast. On physical exam, I palpate soft focal thickening in the area of patient's concern, in the 2 o'clock location of the LEFT breast. Patient states the finding is stable to her exam. Targeted ultrasound is performed, showing focal hyperechoic tissue and tissue marker clip in the 2 o'clock location of RIGHT breast 12 centimeters from the nipple corresponding to the area of patient's concern. This area measures 1.8 x 0.7 x 1.4 centimeters. In the retroareolar region 1 o'clock location, a group of hypoechoic circumscribed oval parallel mass is 0.8 x 0.3 x 0.4 centimeters. The appearance is stable. IMPRESSION: 1.  No mammographic or ultrasound evidence for malignancy. 2. Stable appearance of hypoechoic masses in the retroareolar 1 o'clock location of the RIGHT breast. 3. Asymmetry in the superior portion of the RIGHT breast is significantly less apparent. 4. Biopsy-proven adipose tissue corresponds to clinically stable focal soft thickening in the 2 o'clock location of the RIGHT breast. RECOMMENDATION: Bilateral diagnostic mammogram and RIGHT breast ultrasound in 6 months. I have discussed the findings and recommendations with the patient. If applicable, a reminder letter will be sent to the patient  regarding the next appointment. BI-RADS CATEGORY  3: Probably benign. Electronically Signed   By: Nolon Nations M.D.   On: 12/07/2020 14:54       Assessment & Plan:   Problem List Items Addressed This Visit    GERD (gastroesophageal reflux disease)    No upper symptoms reported.  On protonix.       Health care maintenance    Physical today 01/27/21.  Mammogram 12/07/20 - Birads III.  Recommended bilateral diagnostic mammogram with right breast ultrasound in 6 months.  colonoscpy 07/2014 - normal.        History of TIA (transient ischemic attack)    Continue plavix.  Doing well.       Hypercholesterolemia    Continue simvastatin.  Low cholesterol diet and exercise.  Follow lipid panel and liver function tests.       Relevant Orders   CBC with Differential/Platelet   Hepatic function panel   Lipid panel   TSH   Basic metabolic panel    Other Visit Diagnoses    Routine general medical examination at a health care facility    -  Primary       Joan Pheasant, MD

## 2021-01-28 ENCOUNTER — Encounter: Payer: Medicare PPO | Admitting: Internal Medicine

## 2021-02-07 ENCOUNTER — Other Ambulatory Visit: Payer: Self-pay | Admitting: Internal Medicine

## 2021-02-08 ENCOUNTER — Encounter: Payer: Self-pay | Admitting: Internal Medicine

## 2021-02-08 NOTE — Assessment & Plan Note (Signed)
Continue plavix.  Doing well.

## 2021-02-08 NOTE — Assessment & Plan Note (Signed)
Continue simvastatin.  Low cholesterol diet and exercise.  Follow lipid panel and liver function tests.   

## 2021-02-08 NOTE — Assessment & Plan Note (Signed)
No upper symptoms reported.  On protonix.   

## 2021-02-23 ENCOUNTER — Other Ambulatory Visit: Payer: Self-pay | Admitting: Internal Medicine

## 2021-03-20 ENCOUNTER — Other Ambulatory Visit: Payer: Self-pay | Admitting: Internal Medicine

## 2021-04-01 ENCOUNTER — Other Ambulatory Visit: Payer: Self-pay | Admitting: Internal Medicine

## 2021-04-06 DIAGNOSIS — Z86018 Personal history of other benign neoplasm: Secondary | ICD-10-CM | POA: Diagnosis not present

## 2021-04-06 DIAGNOSIS — L578 Other skin changes due to chronic exposure to nonionizing radiation: Secondary | ICD-10-CM | POA: Diagnosis not present

## 2021-04-06 DIAGNOSIS — Z85828 Personal history of other malignant neoplasm of skin: Secondary | ICD-10-CM | POA: Diagnosis not present

## 2021-04-06 DIAGNOSIS — L638 Other alopecia areata: Secondary | ICD-10-CM | POA: Diagnosis not present

## 2021-04-07 ENCOUNTER — Other Ambulatory Visit: Payer: Self-pay | Admitting: Internal Medicine

## 2021-04-08 ENCOUNTER — Ambulatory Visit (INDEPENDENT_AMBULATORY_CARE_PROVIDER_SITE_OTHER): Payer: Medicare PPO

## 2021-04-08 VITALS — Ht 63.0 in | Wt 126.0 lb

## 2021-04-08 DIAGNOSIS — Z0000 Encounter for general adult medical examination without abnormal findings: Secondary | ICD-10-CM

## 2021-04-08 NOTE — Progress Notes (Signed)
Subjective:   Joan Ritter is a 75 y.o. female who presents for Medicare Annual (Subsequent) preventive examination.  Review of Systems    No ROS.  Medicare Wellness Virtual Visit.  Visual/audio telehealth visit, UTA vital signs.   See social history for additional risk factors.   Cardiac Risk Factors include: advanced age (>66men, >61 women)     Objective:    Today's Vitals   04/08/21 1236  Weight: 126 lb (57.2 kg)  Height: 5\' 3"  (1.6 m)   Body mass index is 22.32 kg/m.  Advanced Directives 04/08/2021 04/07/2020 03/25/2019 01/10/2019 02/22/2017  Does Patient Have a Medical Advance Directive? No No No No No  Does patient want to make changes to medical advance directive? - - Yes (MAU/Ambulatory/Procedural Areas - Information given) - -  Would patient like information on creating a medical advance directive? No - Patient declined No - Patient declined - No - Patient declined No - Patient declined    Current Medications (verified) Outpatient Encounter Medications as of 04/08/2021  Medication Sig   Acetaminophen (TYLENOL ARTHRITIS PAIN PO) Take by mouth 2 (two) times daily.   acyclovir (ZOVIRAX) 800 MG tablet TAKE 1 TABLET (800 MG TOTAL) BY MOUTH AS DIRECTED.   aspirin 81 MG tablet Take 81 mg by mouth daily.   Calcium Carbonate-Vit D-Min 600-400 MG-UNIT TABS Take 1 tablet by mouth 2 (two) times daily.   cetirizine (ZYRTEC) 10 MG tablet Take 10 mg by mouth daily as needed.    clopidogrel (PLAVIX) 75 MG tablet TAKE 1 TABLET BY MOUTH EVERY DAY   famotidine (PEPCID) 20 MG tablet TAKE 1 TABLET BY MOUTH EVERY DAY   fluticasone (FLONASE) 50 MCG/ACT nasal spray USE 2 SPRAYS EACH NOSTRIL EVERY DAY (Patient taking differently: USE 2 SPRAYS EACH NOSTRIL DAILY AS NEEDED)   Multiple Vitamin (MULTIVITAMIN) tablet Take 1 tablet by mouth daily.   pantoprazole (PROTONIX) 40 MG tablet TAKE 1 TABLET BY MOUTH EVERY DAY   Probiotic Product (PROBIOTIC-10 PO) Take by mouth.   simvastatin (ZOCOR) 20 MG  tablet TAKE 1 TABLET BY MOUTH EVERYDAY AT BEDTIME   No facility-administered encounter medications on file as of 04/08/2021.    Allergies (verified) No known drug allergy   History: Past Medical History:  Diagnosis Date   Allergic rhinitis    seasonal   Elevated TSH    Esophagitis    GERD (gastroesophageal reflux disease)    History of Helicobacter pylori infection 2012   Hypercholesterolemia    Osteopenia    TIA (transient ischemic attack) 2001   Past Surgical History:  Procedure Laterality Date   BREAST BIOPSY Right 2000   neg   BREAST BIOPSY Right 05/18/2017   adipose tissue, coil marker   BREAST EXCISIONAL BIOPSY Right 1970   NEG   COLONOSCOPY  2005   normal    ESOPHAGOGASTRODUODENOSCOPY  2012   normal    HEMORRHOID SURGERY  1980s   Family History  Problem Relation Age of Onset   Lymphoma Father    CVA Mother        h/o cerebral hemorrhage   Brain cancer Brother    Cardiomyopathy Sister        enlarged heart, died age 16 - sudden death   Breast cancer Maternal Aunt        x2   Colon cancer Neg Hx    Social History   Socioeconomic History   Marital status: Married    Spouse name: Not on file   Number  of children: 2   Years of education: Not on file   Highest education level: Not on file  Occupational History   Not on file  Tobacco Use   Smoking status: Never   Smokeless tobacco: Never  Vaping Use   Vaping Use: Never used  Substance and Sexual Activity   Alcohol use: No    Alcohol/week: 0.0 standard drinks   Drug use: No   Sexual activity: Yes    Birth control/protection: Post-menopausal, Abstinence  Other Topics Concern   Not on file  Social History Narrative   Not on file   Social Determinants of Health   Financial Resource Strain: Low Risk    Difficulty of Paying Living Expenses: Not hard at all  Food Insecurity: No Food Insecurity   Worried About Charity fundraiser in the Last Year: Never true   [place] in the Last Year:  Never true  Transportation Needs: No Transportation Needs   Lack of Transportation (Medical): No   Lack of Transportation (Non-Medical): No  Physical Activity: Sufficiently Active   Days of Exercise per Week: 4 days   Minutes of Exercise per Session: 60 min  Stress: No Stress Concern Present   Feeling of Stress : Not at all  Social Connections: Unknown   Frequency of Communication with Friends and Family: More than three times a week   Frequency of Social Gatherings with Friends and Family: More than three times a week   Attends Religious Services: Not on Electrical engineer or Organizations: Not on file   Attends Archivist Meetings: Not on file   Marital Status: Not on file    Tobacco Counseling Counseling given: Not Answered   Clinical Intake:  Pre-visit preparation completed: Yes        Diabetes: No  How often do you need to have someone help you when you read instructions, pamphlets, or other written materials from your doctor or pharmacy?: 1 - Never   Interpreter Needed?: No      Activities of Daily Living In your present state of health, do you have any difficulty performing the following activities: 04/08/2021  Hearing? N  Vision? N  Difficulty concentrating or making decisions? N  Walking or climbing stairs? N  Dressing or bathing? N  Doing errands, shopping? N  Preparing Food and eating ? N  Using the Toilet? N  In the past six months, have you accidently leaked urine? N  Do you have problems with loss of bowel control? N  Managing your Medications? N  Managing your Finances? N  Housekeeping or managing your Housekeeping? N  Some recent data might be hidden    Patient Care Team: Einar Pheasant, MD as PCP - General (Internal Medicine)  Indicate any recent Medical Services you may have received from other than [facility] providers in the past year (date may be approximate).     Assessment:   This is a routine wellness examination  for [name].  I connected with Larayne today by telephone and verified that I am speaking with the correct person using two identifiers. Location patient: home Location provider: work Persons participating in the virtual visit: patient, Marine scientist.    I discussed the limitations, risks, security and privacy concerns of performing an evaluation and management service by telephone and the availability of in person appointments. The patient expressed understanding and verbally consented to this telephonic visit.    Interactive audio and video telecommunications were attempted between  this provider and patient, however failed, due to patient having technical difficulties OR patient did not have access to video capability.  We continued and completed visit with audio only.  Some vital signs may be absent or patient reported.   Hearing/Vision screen Hearing Screening - Comments:: Patient is able to hear conversational tones without difficulty.  No issues reported.   Dietary issues and exercise activities discussed: Current Exercise Habits: Home exercise routine, Intensity: Mild Healthy diet Good water intake   Goals Addressed               This Visit's Progress     Patient Stated     Maintain Healthy Lifestyle (pt-stated)        Stay active Healthy diet         Depression Screen PHQ 2/9 Scores 04/08/2021 04/07/2020 03/25/2019 02/22/2017 10/09/2016 04/21/2016 04/20/2015  PHQ - 2 Score 0 0 0 0 0 0 0  PHQ- 9 Score - - - 0 - - -    Fall Risk Fall Risk  04/08/2021 04/07/2020 03/25/2019 02/22/2017 10/09/2016  Falls in the past year? 0 0 1 No No  Number falls in past yr: 0 0 - - -  Injury with Fall? 0 - 1 - -  Comment - - She sought medical care afterwards.  Followed by pcp.  - -  Follow up Falls evaluation completed Falls evaluation completed - - -    FALL RISK PREVENTION PERTAINING TO THE HOME: Handrails in use when climbing stairs? Yes Home free of loose throw rugs in walkways, pet beds,  electrical cords, etc? Yes  Adequate lighting in your home to reduce risk of falls? Yes   ASSISTIVE DEVICES UTILIZED TO PREVENT FALLS:  Life alert? No  Use of a cane, walker or w/c? No   TIMED UP AND GO: Was the test performed? No .   Cognitive Function: Patient is alert and oriented x3.  Denies difficulty focusing, making decisions, memory loss.  MMSE/6CIT deferred. Normal by direct communication/observation.  MMSE - Mini Mental State Exam 02/22/2017  Orientation to time 5  Orientation to Place 5  Registration 3  Attention/ Calculation 5  Recall 3  Language- name 2 objects 2  Language- repeat 1  Language- follow 3 step command 3  Language- read & follow direction 1  Write a sentence 1  Copy design 1  Total score 30     6CIT Screen 04/07/2020 03/25/2019  What Year? 0 points 0 points  What month? 0 points 0 points  What time? - 0 points  Count back from 20 - 0 points  Months in reverse 0 points 0 points  Repeat phrase 0 points 0 points  Total Score - 0    Immunizations Immunization History  Administered Date(s) Administered   Influenza Split 08/18/2014   Influenza, High Dose Seasonal PF 06/28/2017, 07/03/2018, 06/12/2019   Influenza-Unspecified 07/23/2013, 09/01/2015, 07/26/2016   PFIZER(Purple Top)SARS-COV-2 Vaccination 11/12/2019, 12/03/2019, 08/11/2020   Pneumococcal Conjugate-13 05/01/2014   Pneumococcal Polysaccharide-23 08/21/2017   Zoster Recombinat (Shingrix) 02/22/2017, 06/28/2017   Zoster, Live 10/23/2005    Health Maintenance There are no preventive care reminders to display for this patient. Health Maintenance  Topic Date Due   COVID-19 Vaccine (4 - Booster for Pfizer series) 04/24/2021 (Originally 11/11/2020)   TETANUS/TDAP  04/08/2022 (Originally 11/26/1964)   Hepatitis C Screening  04/08/2022 (Originally 11/27/1963)   MAMMOGRAM  05/18/2021   INFLUENZA VACCINE  05/23/2021   COLONOSCOPY (Pts 45-49yrs Insurance coverage will need to be  confirmed)   08/10/2024   DEXA SCAN  Completed   PNA vac Low Risk Adult  Completed   Zoster Vaccines- Shingrix  Completed   HPV VACCINES  Aged Out   Mammogram status: Completed 05/16/20. Repeat every year. Ordered. Plans to schedule.   Lung Cancer Screening: (Low Dose CT Chest recommended if Age 43-80 years, 30 pack-year currently smoking OR have quit w/in 15years.) does not qualify.   Hepatitis C Screening: deferred per patient unless pcp wants to add.    Vision Screening: Recommended annual ophthalmology exams for early detection of glaucoma and other disorders of the eye. Is the patient up to date with their annual eye exam?  Yes    Dental Screening: Recommended annual dental exams for proper oral hygiene  Community Resource Referral / Chronic Care Management: CRR required this visit?  No   CCM required this visit?  No      Plan:   Keep all routine maintenance appointments.   I have personally reviewed and noted the following in the patient's chart:   Medical and social history Use of alcohol, tobacco or illicit drugs  Current medications and supplements including opioid prescriptions. Patient is not currently taking opioids.  Functional ability and status Nutritional status Physical activity Advanced directives List of other physicians Hospitalizations, surgeries, and ER visits in previous 12 months Vitals Screenings to include cognitive, depression, and falls Referrals and appointments  In addition, I have reviewed and discussed with patient certain preventive protocols, quality metrics, and best practice recommendations. A written personalized care plan for preventive services as well as general preventive health recommendations were provided to patient via mychart.     Varney Biles, LPN   02/22/8881

## 2021-04-08 NOTE — Patient Instructions (Addendum)
Joan Ritter , Thank you for taking time to come for your Medicare Wellness Visit. I appreciate your ongoing commitment to your health goals. Please review the following plan we discussed and let me know if I can assist you in the future.   These are the goals we discussed:  Goals       Patient Stated     Maintain Healthy Lifestyle (pt-stated)      Stay active Healthy diet          This is a list of the screening recommended for you and due dates:  Health Maintenance  Topic Date Due   COVID-19 Vaccine (4 - Booster for Pfizer series) 04/24/2021*   Tetanus Vaccine  04/08/2022*   Hepatitis C Screening: USPSTF Recommendation to screen - Ages 18-79 yo.  04/08/2022*   Mammogram  05/18/2021   Flu Shot  05/23/2021   Colon Cancer Screening  08/10/2024   DEXA scan (bone density measurement)  Completed   Pneumonia vaccines  Completed   Zoster (Shingles) Vaccine  Completed   HPV Vaccine  Aged Out  *Topic was postponed. The date shown is not the original due date.    Advanced directives: not yet completed  Conditions/risks identified: none new  Follow up in one year for your annual wellness visit    Preventive Care 65 Years and Older, Female Preventive care refers to lifestyle choices and visits with your health care provider that can promote health and wellness. What does preventive care include? A yearly physical exam. This is also called an annual well check. Dental exams once or twice a year. Routine eye exams. Ask your health care provider how often you should have your eyes checked. Personal lifestyle choices, including: Daily care of your teeth and gums. Regular physical activity. Eating a healthy diet. Avoiding tobacco and drug use. Limiting alcohol use. Practicing safe sex. Taking low-dose aspirin every day. Taking vitamin and mineral supplements as recommended by your health care provider. What happens during an annual well check? The services and screenings done  by your health care provider during your annual well check will depend on your age, overall health, lifestyle risk factors, and family history of disease. Counseling  Your health care provider may ask you questions about your: Alcohol use. Tobacco use. Drug use. Emotional well-being. Home and relationship well-being. Sexual activity. Eating habits. History of falls. Memory and ability to understand (cognition). Work and work Statistician. Reproductive health. Screening  You may have the following tests or measurements: Height, weight, and BMI. Blood pressure. Lipid and cholesterol levels. These may be checked every 5 years, or more frequently if you are over 64 years old. Skin check. Lung cancer screening. You may have this screening every year starting at age 79 if you have a 30-pack-year history of smoking and currently smoke or have quit within the past 15 years. Fecal occult blood test (FOBT) of the stool. You may have this test every year starting at age 81. Flexible sigmoidoscopy or colonoscopy. You may have a sigmoidoscopy every 5 years or a colonoscopy every 10 years starting at age 66. Hepatitis C blood test. Hepatitis B blood test. Sexually transmitted disease (STD) testing. Diabetes screening. This is done by checking your blood sugar (glucose) after you have not eaten for a while (fasting). You may have this done every 1-3 years. Bone density scan. This is done to screen for osteoporosis. You may have this done starting at age 14. Mammogram. This may be done every 1-2 years.  Talk to your health care provider about how often you should have regular mammograms. Talk with your health care provider about your test results, treatment options, and if necessary, the need for more tests. Vaccines  Your health care provider may recommend certain vaccines, such as: Influenza vaccine. This is recommended every year. Tetanus, diphtheria, and acellular pertussis (Tdap, Td) vaccine. You  may need a Td booster every 10 years. Zoster vaccine. You may need this after age 44. Pneumococcal 13-valent conjugate (PCV13) vaccine. One dose is recommended after age 24. Pneumococcal polysaccharide (PPSV23) vaccine. One dose is recommended after age 98. Talk to your health care provider about which screenings and vaccines you need and how often you need them. This information is not intended to replace advice given to you by your health care provider. Make sure you discuss any questions you have with your health care provider. Document Released: 11/05/2015 Document Revised: 06/28/2016 Document Reviewed: 08/10/2015 Elsevier Interactive Patient Education  2017 [place] Prevention in the Home Falls can cause injuries. They can happen to people of all ages. There are many things you can do to make your home safe and to help prevent falls. What can I do on the outside of my home? Regularly fix the edges of walkways and driveways and fix any cracks. Remove anything that might make you trip as you walk through a door, such as a raised step or threshold. Trim any bushes or trees on the path to your home. Use bright outdoor lighting. Clear any walking paths of anything that might make someone trip, such as rocks or tools. Regularly check to see if handrails are loose or broken. Make sure that both sides of any steps have handrails. Any raised decks and porches should have guardrails on the edges. Have any leaves, snow, or ice cleared regularly. Use sand or salt on walking paths during winter. Clean up any spills in your garage right away. This includes oil or grease spills. What can I do in the bathroom? Use night lights. Install grab bars by the toilet and in the tub and shower. Do not use towel bars as grab bars. Use non-skid mats or decals in the tub or shower. If you need to sit down in the shower, use a plastic, non-slip stool. Keep the floor dry. Clean up any water that spills  on the floor as soon as it happens. Remove soap buildup in the tub or shower regularly. Attach bath mats securely with double-sided non-slip rug tape. Do not have throw rugs and other things on the floor that can make you trip. What can I do in the bedroom? Use night lights. Make sure that you have a light by your bed that is easy to reach. Do not use any sheets or blankets that are too big for your bed. They should not hang down onto the floor. Have a firm chair that has side arms. You can use this for support while you get dressed. Do not have throw rugs and other things on the floor that can make you trip. What can I do in the kitchen? Clean up any spills right away. Avoid walking on wet floors. Keep items that you use a lot in easy-to-reach places. If you need to reach something above you, use a strong step stool that has a grab bar. Keep electrical cords out of the way. Do not use floor polish or wax that makes floors slippery. If you must use wax, use non-skid floor wax.  Do not have throw rugs and other things on the floor that can make you trip. What can I do with my stairs? Do not leave any items on the stairs. Make sure that there are handrails on both sides of the stairs and use them. Fix handrails that are broken or loose. Make sure that handrails are as long as the stairways. Check any carpeting to make sure that it is firmly attached to the stairs. Fix any carpet that is loose or worn. Avoid having throw rugs at the top or bottom of the stairs. If you do have throw rugs, attach them to the floor with carpet tape. Make sure that you have a light switch at the top of the stairs and the bottom of the stairs. If you do not have them, ask someone to add them for you. What else can I do to help prevent falls? Wear shoes that: Do not have high heels. Have rubber bottoms. Are comfortable and fit you well. Are closed at the toe. Do not wear sandals. If you use a stepladder: Make  sure that it is fully opened. Do not climb a closed stepladder. Make sure that both sides of the stepladder are locked into place. Ask someone to hold it for you, if possible. Clearly mark and make sure that you can see: Any grab bars or handrails. First and last steps. Where the edge of each step is. Use tools that help you move around (mobility aids) if they are needed. These include: Canes. Walkers. Scooters. Crutches. Turn on the lights when you go into a dark area. Replace any light bulbs as soon as they burn out. Set up your furniture so you have a clear path. Avoid moving your furniture around. If any of your floors are uneven, fix them. If there are any pets around you, be aware of where they are. Review your medicines with your doctor. Some medicines can make you feel dizzy. This can increase your chance of falling. Ask your doctor what other things that you can do to help prevent falls. This information is not intended to replace advice given to you by your health care provider. Make sure you discuss any questions you have with your health care provider. Document Released: 08/05/2009 Document Revised: 03/16/2016 Document Reviewed: 11/13/2014 Elsevier Interactive Patient Education  2017 [employer].

## 2021-04-16 ENCOUNTER — Other Ambulatory Visit: Payer: Self-pay | Admitting: Internal Medicine

## 2021-05-12 ENCOUNTER — Other Ambulatory Visit: Payer: Self-pay | Admitting: Internal Medicine

## 2021-05-31 ENCOUNTER — Other Ambulatory Visit: Payer: Self-pay | Admitting: Internal Medicine

## 2021-06-09 ENCOUNTER — Other Ambulatory Visit: Payer: Self-pay | Admitting: Internal Medicine

## 2021-06-14 ENCOUNTER — Other Ambulatory Visit: Payer: Self-pay | Admitting: Internal Medicine

## 2021-06-15 ENCOUNTER — Ambulatory Visit
Admission: RE | Admit: 2021-06-15 | Discharge: 2021-06-15 | Disposition: A | Discharge status: Home / Self Care | Payer: Medicare PPO | Source: Ambulatory Visit | Attending: Internal Medicine | Admitting: Internal Medicine

## 2021-06-15 ENCOUNTER — Other Ambulatory Visit: Payer: Self-pay

## 2021-06-15 DIAGNOSIS — N6489 Other specified disorders of breast: Secondary | ICD-10-CM | POA: Diagnosis not present

## 2021-06-15 DIAGNOSIS — R928 Other abnormal and inconclusive findings on diagnostic imaging of breast: Secondary | ICD-10-CM

## 2021-06-15 DIAGNOSIS — R922 Inconclusive mammogram: Secondary | ICD-10-CM | POA: Diagnosis not present

## 2021-07-27 ENCOUNTER — Other Ambulatory Visit: Payer: Self-pay

## 2021-07-27 ENCOUNTER — Other Ambulatory Visit (INDEPENDENT_AMBULATORY_CARE_PROVIDER_SITE_OTHER): Payer: Medicare PPO

## 2021-07-27 DIAGNOSIS — E7800 Pure hypercholesterolemia, unspecified: Secondary | ICD-10-CM

## 2021-07-27 LAB — LIPID PANEL
Cholesterol: 170 mg/dL (ref 0–200)
HDL: 69 mg/dL (ref >39.00)
LDL Cholesterol: 77 mg/dL (ref 0–99)
NonHDL: 100.75
Total CHOL/HDL Ratio: 2
Triglycerides: 117 mg/dL (ref 0.0–149.0)
VLDL: 23.4 mg/dL (ref 0.0–40.0)

## 2021-07-27 LAB — CBC WITH DIFFERENTIAL/PLATELET
Basophils Absolute: 0.1 10*3/uL (ref 0.0–0.1)
Basophils Relative: 0.8 % (ref 0.0–3.0)
Eosinophils Absolute: 0.2 10*3/uL (ref 0.0–0.7)
Eosinophils Relative: 3.9 % (ref 0.0–5.0)
HCT: 42.4 % (ref 36.0–46.0)
Hemoglobin: 14.2 g/dL (ref 12.0–15.0)
Lymphocytes Relative: 34.5 % (ref 12.0–46.0)
Lymphs Abs: 2.2 10*3/uL (ref 0.7–4.0)
MCHC: 33.6 g/dL (ref 30.0–36.0)
MCV: 95.9 fl (ref 78.0–100.0)
Monocytes Absolute: 0.5 10*3/uL (ref 0.1–1.0)
Monocytes Relative: 8.2 % (ref 3.0–12.0)
Neutro Abs: 3.3 10*3/uL (ref 1.4–7.7)
Neutrophils Relative %: 52.6 % (ref 43.0–77.0)
Platelets: 206 10*3/uL (ref 150.0–400.0)
RBC: 4.42 Mil/uL (ref 3.87–5.11)
RDW: 12.6 % (ref 11.5–15.5)
WBC: 6.3 10*3/uL (ref 4.0–10.5)

## 2021-07-27 LAB — HEPATIC FUNCTION PANEL
ALT: 14 U/L (ref 0–35)
AST: 17 U/L (ref 0–37)
Albumin: 4 g/dL (ref 3.5–5.2)
Alkaline Phosphatase: 47 U/L (ref 39–117)
Bilirubin, Direct: 0.1 mg/dL (ref 0.0–0.3)
Total Bilirubin: 0.7 mg/dL (ref 0.2–1.2)
Total Protein: 6.2 g/dL (ref 6.0–8.3)

## 2021-07-27 LAB — BASIC METABOLIC PANEL
BUN: 12 mg/dL (ref 6–23)
CO2: 31 mEq/L (ref 19–32)
Calcium: 9.3 mg/dL (ref 8.4–10.5)
Chloride: 105 mEq/L (ref 96–112)
Creatinine, Ser: 0.96 mg/dL (ref 0.40–1.20)
GFR: 57.81 mL/min — ABNORMAL LOW (ref >60.00)
Glucose, Bld: 96 mg/dL (ref 70–99)
Potassium: 4.1 mEq/L (ref 3.5–5.1)
Sodium: 142 mEq/L (ref 135–145)

## 2021-07-27 LAB — TSH: TSH: 4.67 u[IU]/mL (ref 0.35–5.50)

## 2021-07-29 ENCOUNTER — Ambulatory Visit: Payer: Medicare PPO | Admitting: Internal Medicine

## 2021-08-03 ENCOUNTER — Other Ambulatory Visit: Payer: Self-pay

## 2021-08-03 ENCOUNTER — Encounter: Payer: Self-pay | Admitting: Internal Medicine

## 2021-08-03 ENCOUNTER — Ambulatory Visit: Payer: Medicare PPO | Admitting: Internal Medicine

## 2021-08-03 ENCOUNTER — Telehealth: Payer: Self-pay | Admitting: Internal Medicine

## 2021-08-03 VITALS — BP 114/70 | HR 60 | Temp 97.9°F | Resp 16 | Ht 63.0 in | Wt 125.6 lb

## 2021-08-03 DIAGNOSIS — Z8673 Personal history of transient ischemic attack (TIA), and cerebral infarction without residual deficits: Secondary | ICD-10-CM | POA: Diagnosis not present

## 2021-08-03 DIAGNOSIS — E7800 Pure hypercholesterolemia, unspecified: Secondary | ICD-10-CM | POA: Diagnosis not present

## 2021-08-03 DIAGNOSIS — Z23 Encounter for immunization: Secondary | ICD-10-CM

## 2021-08-03 DIAGNOSIS — H26491 Other secondary cataract, right eye: Secondary | ICD-10-CM | POA: Diagnosis not present

## 2021-08-03 DIAGNOSIS — K219 Gastro-esophageal reflux disease without esophagitis: Secondary | ICD-10-CM | POA: Diagnosis not present

## 2021-08-03 DIAGNOSIS — N1831 Chronic kidney disease, stage 3a: Secondary | ICD-10-CM | POA: Diagnosis not present

## 2021-08-03 DIAGNOSIS — Z961 Presence of intraocular lens: Secondary | ICD-10-CM | POA: Diagnosis not present

## 2021-08-03 NOTE — Progress Notes (Signed)
Patient ID: DANNA SEWELL, female   DOB: [birth year], 75 y.o.   MRN: [id number]   Subjective:    Patient ID: Windy Kalata, female    DOB: [birth year], 75 y.o.   MRN: [id number]  This visit occurred during the SARS-CoV-2 public health emergency.  Safety protocols were in place, including screening questions prior to the visit, additional usage of staff PPE, and extensive cleaning of exam room while observing appropriate contact time as indicated for disinfecting solutions.   Patient here for a scheduled follow up.   Chief Complaint  Patient presents with   Hyperlipidemia   Gastroesophageal Reflux   .   HPI Doing well.  Feels good. Stays active.  No chest pain or sob with increased activity or exertion.  No acid reflux reported.  No abdominal pain.  Bowels moving.  Exercising - silver sneakers.  Colonoscopy 07/2014.  Recommended f/u in 10 years.    Past Medical History:  Diagnosis Date   Allergic rhinitis    seasonal   Elevated TSH    Esophagitis    GERD (gastroesophageal reflux disease)    History of Helicobacter pylori infection 2012   Hypercholesterolemia    Osteopenia    TIA (transient ischemic attack) 2001   Past Surgical History:  Procedure Laterality Date   BREAST BIOPSY Right 2000   neg   BREAST BIOPSY Right 05/18/2017   adipose tissue, coil marker   BREAST EXCISIONAL BIOPSY Right 1970   NEG   COLONOSCOPY  2005   normal    ESOPHAGOGASTRODUODENOSCOPY  2012   normal    HEMORRHOID SURGERY  1980s   Family History  Problem Relation Age of Onset   Lymphoma Father    CVA Mother        h/o cerebral hemorrhage   Brain cancer Brother    Cardiomyopathy Sister        enlarged heart, died age 65 - sudden death   Breast cancer Maternal Aunt        x2   Colon cancer Neg Hx    Social History   Socioeconomic History   Marital status: Married    Spouse name: Not on file   Number of children: 2   Years of education: Not on file   Highest education level: Not on file   Occupational History   Not on file  Tobacco Use   Smoking status: Never   Smokeless tobacco: Never  Vaping Use   Vaping Use: Never used  Substance and Sexual Activity   Alcohol use: No    Alcohol/week: 0.0 standard drinks   Drug use: No   Sexual activity: Yes    Birth control/protection: Post-menopausal, Abstinence  Other Topics Concern   Not on file  Social History Narrative   Not on file   Social Determinants of Health   Financial Resource Strain: Low Risk    Difficulty of Paying Living Expenses: Not hard at all  Food Insecurity: No Food Insecurity   Worried About Charity fundraiser in the Last Year: Never true   Ran Out of Food in the Last Year: Never true  Transportation Needs: No Transportation Needs   Lack of Transportation (Medical): No   Lack of Transportation (Non-Medical): No  Physical Activity: Sufficiently Active   Days of Exercise per Week: 4 days   Minutes of Exercise per Session: 60 min  Stress: No Stress Concern Present   Feeling of Stress : Not at all  Social Connections: Unknown   Frequency  of Communication with Friends and Family: More than three times a week   Frequency of Social Gatherings with Friends and Family: More than three times a week   Attends Religious Services: Not on Electrical engineer or Organizations: Not on file   Attends Archivist Meetings: Not on file   Marital Status: Not on file     Review of Systems  Constitutional:  Negative for appetite change and unexpected weight change.  HENT:  Negative for congestion and sinus pressure.   Respiratory:  Negative for cough, chest tightness and shortness of breath.   Cardiovascular:  Negative for chest pain, palpitations and leg swelling.  Gastrointestinal:  Negative for abdominal pain, diarrhea, nausea and vomiting.  Genitourinary:  Negative for difficulty urinating and dysuria.  Musculoskeletal:  Negative for joint swelling and myalgias.  Skin:  Negative for  color change and rash.  Neurological:  Negative for dizziness, light-headedness and headaches.  Psychiatric/Behavioral:  Negative for agitation and dysphoric mood.       Objective:     BP 114/70   Pulse 60   Temp 97.9 F (36.6 C)   Resp 16   Ht 5\' 3"  (1.6 m)   Wt 125 lb 9.6 oz (57 kg)   SpO2 98%   BMI 22.25 kg/m  Wt Readings from Last 3 Encounters:  08/03/21 125 lb 9.6 oz (57 kg)  04/08/21 126 lb (57.2 kg)  01/27/21 126 lb (57.2 kg)    Physical Exam Vitals reviewed.  Constitutional:      General: She is not in acute distress.    Appearance: Normal appearance.  HENT:     Head: Normocephalic and atraumatic.     Right Ear: External ear normal.     Left Ear: External ear normal.  Eyes:     General: No scleral icterus.       Right eye: No discharge.        Left eye: No discharge.     Conjunctiva/sclera: Conjunctivae normal.  Neck:     Thyroid: No thyromegaly.  Cardiovascular:     Rate and Rhythm: Normal rate and regular rhythm.  Pulmonary:     Effort: No respiratory distress.     Breath sounds: Normal breath sounds. No wheezing.  Abdominal:     General: Bowel sounds are normal.     Palpations: Abdomen is soft.     Tenderness: There is no abdominal tenderness.  Musculoskeletal:        General: No swelling or tenderness.     Cervical back: Neck supple. No tenderness.  Lymphadenopathy:     Cervical: No cervical adenopathy.  Skin:    Findings: No erythema or rash.  Neurological:     Mental Status: She is alert.  Psychiatric:        Mood and Affect: Mood normal.        Behavior: Behavior normal.     Outpatient Encounter Medications as of 08/03/2021  Medication Sig   Acetaminophen (TYLENOL ARTHRITIS PAIN PO) Take by mouth 2 (two) times daily.   acyclovir (ZOVIRAX) 800 MG tablet TAKE 1 TABLET (800 MG TOTAL) BY MOUTH AS DIRECTED.   aspirin 81 MG tablet Take 81 mg by mouth daily.   Calcium Carbonate-Vit D-Min 600-400 MG-UNIT TABS Take 1 tablet by mouth 2 (two)  times daily.   cetirizine (ZYRTEC) 10 MG tablet Take 10 mg by mouth daily as needed.    clopidogrel (PLAVIX) 75 MG tablet TAKE 1 TABLET BY MOUTH  EVERY DAY   famotidine (PEPCID) 20 MG tablet TAKE 1 TABLET BY MOUTH EVERY DAY   fluticasone (FLONASE) 50 MCG/ACT nasal spray USE 2 SPRAYS EACH NOSTRIL EVERY DAY (Patient taking differently: USE 2 SPRAYS EACH NOSTRIL DAILY AS NEEDED)   Multiple Vitamin (MULTIVITAMIN) tablet Take 1 tablet by mouth daily.   pantoprazole (PROTONIX) 40 MG tablet TAKE 1 TABLET BY MOUTH EVERY DAY   Probiotic Product (PROBIOTIC-10 PO) Take by mouth.   simvastatin (ZOCOR) 20 MG tablet TAKE 1 TABLET BY MOUTH EVERYDAY AT BEDTIME   No facility-administered encounter medications on file as of 08/03/2021.     Lab Results  Component Value Date   WBC 6.3 07/27/2021   HGB 14.2 07/27/2021   HCT 42.4 07/27/2021   PLT 206.0 07/27/2021   GLUCOSE 96 07/27/2021   CHOL 170 07/27/2021   TRIG 117.0 07/27/2021   HDL 69.00 07/27/2021   LDLCALC 77 07/27/2021   ALT 14 07/27/2021   AST 17 07/27/2021   NA 142 07/27/2021   K 4.1 07/27/2021   CL 105 07/27/2021   CREATININE 0.96 07/27/2021   BUN 12 07/27/2021   CO2 31 07/27/2021   TSH 4.67 07/27/2021    US BREAST LTD UNI RIGHT INC AXILLA  Result Date: 06/15/2021 CLINICAL DATA:  BI-RADS 3 follow-up of a RIGHT breast asymmetry noted mammographically in July 2021 as well a BI-RADS 3 mass noted sonographically in August 2021. EXAM: DIGITAL DIAGNOSTIC BILATERAL MAMMOGRAM WITH TOMOSYNTHESIS AND CAD; ULTRASOUND RIGHT BREAST LIMITED TECHNIQUE: Bilateral digital diagnostic mammography and breast tomosynthesis was performed. The images were evaluated with computer-aided detection.; Targeted ultrasound examination of the right breast was performed COMPARISON:  Previous exam(s). ACR Breast Density Category c: The breast tissue is heterogeneously dense, which may obscure small masses. FINDINGS: On today's diagnostic images, a previously described  probably benign focal asymmetry in the RIGHT upper inner breast is no longer visualized. No new suspicious findings within the RIGHT breast. No suspicious mass, distortion, or microcalcifications are identified to suggest presence of malignancy in the LEFT breast. Targeted ultrasound was performed of the RIGHT breast. At 1 o'clock in the retroareolar breast, there is revisualization of an oval circumscribed hypoechoic mass. It measures 8 x 2 x 5 mm, previously 8 x 4 x 5 mm. Differences in measurement are due to differences in scan plane technique. Targeted ultrasound was performed of the previous described area of palpable concern in the RIGHT upper inner breast. At 2 o'clock 12 cm from the nipple, there is revisualization of an isoechoic mass with internal clip. It is consistent with benign biopsy-proven fat lobule versus lipoma. IMPRESSION: 1. Previously described RIGHT breast focal asymmetry is no longer visualized. No further dedicated follow-up is indicated for this finding. 2. Stable sonographically identified probably benign RIGHT breast mass at 1 o'clock. Recommend follow-up mammogram and ultrasound in 1 year. This will establish 2 years of definitive stability. 3. No mammographic evidence of malignancy in the LEFT breast. RECOMMENDATION: Bilateral diagnostic mammogram with RIGHT breast ultrasound in 1 year. I have discussed the findings and recommendations with the patient. If applicable, a reminder letter will be sent to the patient regarding the next appointment. BI-RADS CATEGORY  3: Probably benign. Electronically Signed   By: Valentino Saxon M.D.   On: 06/15/2021 15:44  MM DIAG BREAST TOMO BILATERAL  Result Date: 06/15/2021 CLINICAL DATA:  BI-RADS 3 follow-up of a RIGHT breast asymmetry noted mammographically in July 2021 as well a BI-RADS 3 mass noted sonographically in August 2021. EXAM:  DIGITAL DIAGNOSTIC BILATERAL MAMMOGRAM WITH TOMOSYNTHESIS AND CAD; ULTRASOUND RIGHT BREAST LIMITED  TECHNIQUE: Bilateral digital diagnostic mammography and breast tomosynthesis was performed. The images were evaluated with computer-aided detection.; Targeted ultrasound examination of the right breast was performed COMPARISON:  Previous exam(s). ACR Breast Density Category c: The breast tissue is heterogeneously dense, which may obscure small masses. FINDINGS: On today's diagnostic images, a previously described probably benign focal asymmetry in the RIGHT upper inner breast is no longer visualized. No new suspicious findings within the RIGHT breast. No suspicious mass, distortion, or microcalcifications are identified to suggest presence of malignancy in the LEFT breast. Targeted ultrasound was performed of the RIGHT breast. At 1 o'clock in the retroareolar breast, there is revisualization of an oval circumscribed hypoechoic mass. It measures 8 x 2 x 5 mm, previously 8 x 4 x 5 mm. Differences in measurement are due to differences in scan plane technique. Targeted ultrasound was performed of the previous described area of palpable concern in the RIGHT upper inner breast. At 2 o'clock 12 cm from the nipple, there is revisualization of an isoechoic mass with internal clip. It is consistent with benign biopsy-proven fat lobule versus lipoma. IMPRESSION: 1. Previously described RIGHT breast focal asymmetry is no longer visualized. No further dedicated follow-up is indicated for this finding. 2. Stable sonographically identified probably benign RIGHT breast mass at 1 o'clock. Recommend follow-up mammogram and ultrasound in 1 year. This will establish 2 years of definitive stability. 3. No mammographic evidence of malignancy in the LEFT breast. RECOMMENDATION: Bilateral diagnostic mammogram with RIGHT breast ultrasound in 1 year. I have discussed the findings and recommendations with the patient. If applicable, a reminder letter will be sent to the patient regarding the next appointment. BI-RADS CATEGORY  3: Probably  benign. Electronically Signed   By: Valentino Saxon M.D.   On: 06/15/2021 15:44      Assessment & Plan:   Problem List Items Addressed This Visit     CKD (chronic kidney disease) stage 3, GFR 30-59 ml/min (HCC)    Avoid antiinflammatories.  Stay hydrated.  Follow metabolic panel.       GERD (gastroesophageal reflux disease)    No upper symptoms reported.  On protonix.       History of TIA (transient ischemic attack)    Continue plavix.  Doing well.       Hypercholesterolemia    Continue simvastatin.  Low cholesterol diet and exercise.  Follow lipid panel and liver function tests.       Other Visit Diagnoses     Need for immunization against influenza    -  Primary   Relevant Orders   Flu Vaccine QUAD High Dose(Fluad) (Completed)        Einar Pheasant, MD

## 2021-08-03 NOTE — Telephone Encounter (Signed)
Labs placed.

## 2021-08-03 NOTE — Addendum Note (Signed)
Addended by: Lars Masson on: 08/03/2021 02:18 PM   Modules accepted: Orders

## 2021-08-03 NOTE — Telephone Encounter (Signed)
Patient scheduled for labs 11/2021 as requested by check out note.   Needing orders placed.

## 2021-08-06 ENCOUNTER — Encounter: Payer: Self-pay | Admitting: Internal Medicine

## 2021-08-06 DIAGNOSIS — N183 Chronic kidney disease, stage 3 unspecified: Secondary | ICD-10-CM | POA: Insufficient documentation

## 2021-08-06 NOTE — Assessment & Plan Note (Signed)
Continue simvastatin.  Low cholesterol diet and exercise.  Follow lipid panel and liver function tests.   

## 2021-08-06 NOTE — Assessment & Plan Note (Signed)
Avoid antiinflammatories.  Stay hydrated.  Follow metabolic panel.   

## 2021-08-06 NOTE — Assessment & Plan Note (Signed)
No upper symptoms reported.  On protonix.   

## 2021-08-06 NOTE — Assessment & Plan Note (Signed)
Continue plavix.  Doing well.

## 2021-08-31 ENCOUNTER — Telehealth: Payer: Self-pay | Admitting: Internal Medicine

## 2021-08-31 NOTE — Telephone Encounter (Signed)
Pts previous pharmacy is now closed. Pt is also going out of town tomorrow and needs new prescriptions for: Clopidogrel (PLAVIX) 75mg  tab Famotidine (PEPCID) 20mg  tab  Pantoprazole (PROTONIX) 40mg  tab Simvastatin (ZOCOR) 20mg  tab   Pt would like prescriptions to be sent to [facility] in [place], [place]. Pt states prescriptions need to be authorized for the first time via phone in order for pt to be able to pick up.   [facility]: [phone]

## 2021-09-01 ENCOUNTER — Other Ambulatory Visit: Payer: Self-pay

## 2021-09-01 MED ORDER — FAMOTIDINE 20 MG PO TABS: 20.0000 mg | ORAL_TABLET | Freq: Every day | ORAL | 3 refills | Status: DC

## 2021-09-01 MED ORDER — SIMVASTATIN 20 MG PO TABS: ORAL_TABLET | ORAL | 3 refills | Status: DC

## 2021-09-01 MED ORDER — PANTOPRAZOLE SODIUM 40 MG PO TBEC: 40.0000 mg | DELAYED_RELEASE_TABLET | Freq: Every day | ORAL | 1 refills | Status: DC

## 2021-09-01 MED ORDER — CLOPIDOGREL BISULFATE 75 MG PO TABS: 75.0000 mg | ORAL_TABLET | Freq: Every day | ORAL | 2 refills | Status: DC

## 2021-09-01 NOTE — Telephone Encounter (Signed)
Prescriptions have been sent in. Spoke with [facility]. LM for patient.

## 2021-11-16 DIAGNOSIS — L578 Other skin changes due to chronic exposure to nonionizing radiation: Secondary | ICD-10-CM | POA: Diagnosis not present

## 2021-11-16 DIAGNOSIS — L570 Actinic keratosis: Secondary | ICD-10-CM | POA: Diagnosis not present

## 2021-11-16 DIAGNOSIS — Z85828 Personal history of other malignant neoplasm of skin: Secondary | ICD-10-CM | POA: Diagnosis not present

## 2021-11-16 DIAGNOSIS — D485 Neoplasm of uncertain behavior of skin: Secondary | ICD-10-CM | POA: Diagnosis not present

## 2021-11-16 DIAGNOSIS — Z86018 Personal history of other benign neoplasm: Secondary | ICD-10-CM | POA: Diagnosis not present

## 2021-11-16 DIAGNOSIS — L821 Other seborrheic keratosis: Secondary | ICD-10-CM | POA: Diagnosis not present

## 2022-01-26 ENCOUNTER — Other Ambulatory Visit (INDEPENDENT_AMBULATORY_CARE_PROVIDER_SITE_OTHER): Payer: Medicare PPO

## 2022-01-26 DIAGNOSIS — E7800 Pure hypercholesterolemia, unspecified: Secondary | ICD-10-CM | POA: Diagnosis not present

## 2022-01-26 LAB — LIPID PANEL
Cholesterol: 162 mg/dL (ref 0–200)
HDL: 59.6 mg/dL (ref >39.00)
LDL Cholesterol: 85 mg/dL (ref 0–99)
NonHDL: 102.76
Total CHOL/HDL Ratio: 3
Triglycerides: 91 mg/dL (ref 0.0–149.0)
VLDL: 18.2 mg/dL (ref 0.0–40.0)

## 2022-01-26 LAB — BASIC METABOLIC PANEL
BUN: 14 mg/dL (ref 6–23)
CO2: 30 mEq/L (ref 19–32)
Calcium: 9.3 mg/dL (ref 8.4–10.5)
Chloride: 106 mEq/L (ref 96–112)
Creatinine, Ser: 0.95 mg/dL (ref 0.40–1.20)
GFR: 58.34 mL/min — ABNORMAL LOW (ref >60.00)
Glucose, Bld: 97 mg/dL (ref 70–99)
Potassium: 4.2 mEq/L (ref 3.5–5.1)
Sodium: 141 mEq/L (ref 135–145)

## 2022-01-26 LAB — HEPATIC FUNCTION PANEL
ALT: 12 U/L (ref 0–35)
AST: 17 U/L (ref 0–37)
Albumin: 3.9 g/dL (ref 3.5–5.2)
Alkaline Phosphatase: 45 U/L (ref 39–117)
Bilirubin, Direct: 0.1 mg/dL (ref 0.0–0.3)
Total Bilirubin: 0.4 mg/dL (ref 0.2–1.2)
Total Protein: 6.1 g/dL (ref 6.0–8.3)

## 2022-02-01 ENCOUNTER — Encounter: Payer: Self-pay | Admitting: Internal Medicine

## 2022-02-01 ENCOUNTER — Ambulatory Visit (INDEPENDENT_AMBULATORY_CARE_PROVIDER_SITE_OTHER): Payer: Medicare PPO | Admitting: Internal Medicine

## 2022-02-01 VITALS — BP 110/74 | HR 66 | Temp 96.9°F | Resp 14 | Ht 63.0 in | Wt 122.0 lb

## 2022-02-01 DIAGNOSIS — N1831 Chronic kidney disease, stage 3a: Secondary | ICD-10-CM | POA: Diagnosis not present

## 2022-02-01 DIAGNOSIS — E7800 Pure hypercholesterolemia, unspecified: Secondary | ICD-10-CM | POA: Diagnosis not present

## 2022-02-01 DIAGNOSIS — K219 Gastro-esophageal reflux disease without esophagitis: Secondary | ICD-10-CM

## 2022-02-01 DIAGNOSIS — Z1211 Encounter for screening for malignant neoplasm of colon: Secondary | ICD-10-CM

## 2022-02-01 DIAGNOSIS — Z8673 Personal history of transient ischemic attack (TIA), and cerebral infarction without residual deficits: Secondary | ICD-10-CM

## 2022-02-01 DIAGNOSIS — Z0000 Encounter for general adult medical examination without abnormal findings: Secondary | ICD-10-CM | POA: Diagnosis not present

## 2022-02-01 DIAGNOSIS — R928 Other abnormal and inconclusive findings on diagnostic imaging of breast: Secondary | ICD-10-CM | POA: Diagnosis not present

## 2022-02-01 MED ORDER — ROSUVASTATIN CALCIUM 20 MG PO TABS: 20.0000 mg | ORAL_TABLET | Freq: Every day | ORAL | 1 refills | Status: DC

## 2022-02-01 NOTE — Progress Notes (Signed)
Patient ID: Joan Ritter, female   DOB: 12-05-1945, 76 y.o.   MRN: [id number] ? ? ?Subjective:  ? ? Patient ID: Joan Ritter, female    DOB: [birth year], 76 y.o.   MRN: [id number] ? ?This visit occurred during the SARS-CoV-2 public health emergency.  Safety protocols were in place, including screening questions prior to the visit, additional usage of staff PPE, and extensive cleaning of exam room while observing appropriate contact time as indicated for disinfecting solutions.  ? ?Patient here for her physical exam.  ? ?Chief Complaint  ?Patient presents with  ? Follow-up  ?  CPE - no pap - pt reports feeling well  ? .  ? ?HPI ?Is doing well.  Stays active.  Is exercising.  No chest pain or sob reported.  No abdominal pain.  Bowels moving. No blood.  Left fifth finger - arthritis. No significant pain.  Does not bother her.  Planning hearing aids.   ? ? ?Past Medical History:  ?Diagnosis Date  ? Allergic rhinitis   ? seasonal  ? Elevated TSH   ? Esophagitis   ? GERD (gastroesophageal reflux disease)   ? History of Helicobacter pylori infection 2012  ? Hypercholesterolemia   ? Osteopenia   ? TIA (transient ischemic attack) 2001  ? ?Past Surgical History:  ?Procedure Laterality Date  ? BREAST BIOPSY Right 2000  ? neg  ? BREAST BIOPSY Right 05/18/2017  ? adipose tissue, coil marker  ? BREAST EXCISIONAL BIOPSY Right 1970  ? NEG  ? COLONOSCOPY  2005  ? normal   ? ESOPHAGOGASTRODUODENOSCOPY  2012  ? normal   ? HEMORRHOID SURGERY  1980s  ? ?Family History  ?Problem Relation Age of Onset  ? Lymphoma Father   ? CVA Mother   ?     h/o cerebral hemorrhage  ? Brain cancer Brother   ? Cardiomyopathy Sister   ?     enlarged heart, died age 25 - sudden death  ? Breast cancer Maternal Aunt   ?     x2  ? Colon cancer Neg Hx   ? ?Social History  ? ?Socioeconomic History  ? Marital status: Married  ?  Spouse name: Not on file  ? Number of children: 2  ? Years of education: Not on file  ? Highest education level: Not on file  ?Occupational  History  ? Not on file  ?Tobacco Use  ? Smoking status: Never  ? Smokeless tobacco: Never  ?Vaping Use  ? Vaping Use: Never used  ?Substance and Sexual Activity  ? Alcohol use: No  ?  Alcohol/week: 0.0 standard drinks  ? Drug use: No  ? Sexual activity: Yes  ?  Birth control/protection: Post-menopausal, Abstinence  ?Other Topics Concern  ? Not on file  ?Social History Narrative  ? Not on file  ? ?Social Determinants of Health  ? ?Financial Resource Strain: Low Risk   ? Difficulty of Paying Living Expenses: Not hard at all  ?Food Insecurity: No Food Insecurity  ? Worried About Charity fundraiser in the Last Year: Never true  ? Ran Out of Food in the Last Year: Never true  ?Transportation Needs: No Transportation Needs  ? Lack of Transportation (Medical): No  ? Lack of Transportation (Non-Medical): No  ?Physical Activity: Sufficiently Active  ? Days of Exercise per Week: 4 days  ? Minutes of Exercise per Session: 60 min  ?Stress: No Stress Concern Present  ? Feeling of Stress : Not at all  ?  Social Connections: Unknown  ? Frequency of Communication with Friends and Family: More than three times a week  ? Frequency of Social Gatherings with Friends and Family: More than three times a week  ? Attends Religious Services: Not on file  ? Active Member of Clubs or Organizations: Not on file  ? Attends Archivist Meetings: Not on file  ? Marital Status: Not on file  ? ? ? ?Review of Systems  ?Constitutional:  Negative for fatigue and unexpected weight change.  ?HENT:  Negative for congestion, sinus pressure and sore throat.   ?Eyes:  Negative for pain and visual disturbance.  ?Respiratory:  Negative for cough, chest tightness and shortness of breath.   ?Cardiovascular:  Negative for chest pain, palpitations and leg swelling.  ?Gastrointestinal:  Negative for abdominal pain, diarrhea, nausea and vomiting.  ?Genitourinary:  Negative for difficulty urinating and dysuria.  ?Musculoskeletal:  Negative for joint swelling  and myalgias.  ?Skin:  Negative for color change and rash.  ?Neurological:  Negative for dizziness, light-headedness and headaches.  ?Hematological:  Negative for adenopathy. Does not bruise/bleed easily.  ?Psychiatric/Behavioral:  Negative for agitation and dysphoric mood.   ? ?   ?Objective:  ?  ? ?BP 110/74 (BP Location: Left Arm, Patient Position: Sitting, Cuff Size: Small)   Pulse 66   Temp (!) 96.9 ?F (36.1 ?C) (Temporal)   Resp 14   Ht '5\' 3"'$  (1.6 m)   Wt 122 lb (55.3 kg)   SpO2 96%   BMI 21.61 kg/m?  ?Wt Readings from Last 3 Encounters:  ?02/01/22 122 lb (55.3 kg)  ?08/03/21 125 lb 9.6 oz (57 kg)  ?04/08/21 126 lb (57.2 kg)  ? ? ?Physical Exam ?Vitals reviewed.  ?Constitutional:   ?   General: She is not in acute distress. ?   Appearance: Normal appearance. She is well-developed.  ?HENT:  ?   Head: Normocephalic and atraumatic.  ?   Right Ear: External ear normal.  ?   Left Ear: External ear normal.  ?Eyes:  ?   General: No scleral icterus.    ?   Right eye: No discharge.     ?   Left eye: No discharge.  ?   Conjunctiva/sclera: Conjunctivae normal.  ?Neck:  ?   Thyroid: No thyromegaly.  ?Cardiovascular:  ?   Rate and Rhythm: Normal rate and regular rhythm.  ?Pulmonary:  ?   Effort: No tachypnea, accessory muscle usage or respiratory distress.  ?   Breath sounds: Normal breath sounds. No decreased breath sounds or wheezing.  ?Chest:  ?Breasts: ?   Right: No inverted nipple, mass, nipple discharge or tenderness (no axillary adenopathy).  ?   Left: No inverted nipple, mass, nipple discharge or tenderness (no axilarry adenopathy).  ?Abdominal:  ?   General: Bowel sounds are normal.  ?   Palpations: Abdomen is soft.  ?   Tenderness: There is no abdominal tenderness.  ?Musculoskeletal:     ?   General: No swelling or tenderness.  ?   Cervical back: Neck supple.  ?Lymphadenopathy:  ?   Cervical: No cervical adenopathy.  ?Skin: ?   Findings: No erythema or rash.  ?Neurological:  ?   Mental Status: She is alert  and oriented to person, place, and time.  ?Psychiatric:     ?   Mood and Affect: Mood normal.     ?   Behavior: Behavior normal.  ? ? ? ?Outpatient Encounter Medications as of 02/01/2022  ?Medication Sig  ?  Acetaminophen (TYLENOL ARTHRITIS PAIN PO) Take by mouth 2 (two) times daily.  ? acyclovir (ZOVIRAX) 800 MG tablet TAKE 1 TABLET (800 MG TOTAL) BY MOUTH AS DIRECTED.  ? aspirin 81 MG tablet Take 81 mg by mouth daily.  ? Calcium Carbonate-Vit D-Min 600-400 MG-UNIT TABS Take 1 tablet by mouth 2 (two) times daily.  ? cetirizine (ZYRTEC) 10 MG tablet Take 10 mg by mouth daily as needed.   ? clopidogrel (PLAVIX) 75 MG tablet Take 1 tablet (75 mg total) by mouth daily.  ? famotidine (PEPCID) 20 MG tablet Take 1 tablet (20 mg total) by mouth daily.  ? fluticasone (FLONASE) 50 MCG/ACT nasal spray USE 2 SPRAYS EACH NOSTRIL EVERY DAY  ? Multiple Vitamin (MULTIVITAMIN) tablet Take 1 tablet by mouth daily.  ? pantoprazole (PROTONIX) 40 MG tablet Take 1 tablet (40 mg total) by mouth daily.  ? Probiotic Product (PROBIOTIC-10 PO) Take by mouth.  ? rosuvastatin (CRESTOR) 20 MG tablet Take 1 tablet (20 mg total) by mouth daily.  ? [DISCONTINUED] simvastatin (ZOCOR) 20 MG tablet TAKE 1 TABLET BY MOUTH EVERYDAY AT BEDTIME  ? ?No facility-administered encounter medications on file as of 02/01/2022.  ?  ? ?Lab Results  ?Component Value Date  ? WBC 6.3 07/27/2021  ? HGB 14.2 07/27/2021  ? HCT 42.4 07/27/2021  ? PLT 206.0 07/27/2021  ? GLUCOSE 97 01/26/2022  ? CHOL 162 01/26/2022  ? TRIG 91.0 01/26/2022  ? HDL 59.60 01/26/2022  ? [place] 85 01/26/2022  ? ALT 12 01/26/2022  ? AST 17 01/26/2022  ? NA 141 01/26/2022  ? K 4.2 01/26/2022  ? CL 106 01/26/2022  ? CREATININE 0.95 01/26/2022  ? BUN 14 01/26/2022  ? CO2 30 01/26/2022  ? TSH 4.67 07/27/2021  ? ? ?US BREAST LTD UNI RIGHT INC AXILLA ? ?Result Date: 06/15/2021 ?CLINICAL DATA:  BI-RADS 3 follow-up of a RIGHT breast asymmetry noted mammographically in July 2021 as well a BI-RADS 3 mass  noted sonographically in August 2021. EXAM: DIGITAL DIAGNOSTIC BILATERAL MAMMOGRAM WITH TOMOSYNTHESIS AND CAD; ULTRASOUND RIGHT BREAST LIMITED TECHNIQUE: Bilateral digital diagnostic mammography and breast

## 2022-02-06 ENCOUNTER — Encounter: Payer: Self-pay | Admitting: Internal Medicine

## 2022-02-06 NOTE — Assessment & Plan Note (Signed)
No upper symptoms reported.  On protonix.   

## 2022-02-06 NOTE — Assessment & Plan Note (Signed)
Physical today 02/01/22.  Mammogram scheduled.  Colonoscopy 07/2014 - normal.  Recommended f/u in 10 years.   ?

## 2022-02-06 NOTE — Assessment & Plan Note (Signed)
Continue plavix.  Doing well.  ?

## 2022-02-06 NOTE — Assessment & Plan Note (Signed)
Discussed labs. On simvastatin.  Change to crestor '20mg'$  q day.  Continue low cholesterol diet and exercise.  Follow lipid panel and liver function tests.  ?

## 2022-02-06 NOTE — Assessment & Plan Note (Signed)
Avoid antiinflammatories.  Stay hydrated.  Follow metabolic panel.   

## 2022-02-08 LAB — FECAL OCCULT BLOOD, IMMUNOCHEMICAL: Fecal Occult Bld: NEGATIVE

## 2022-04-07 ENCOUNTER — Telehealth: Payer: Self-pay | Admitting: Internal Medicine

## 2022-04-07 NOTE — Telephone Encounter (Signed)
Copied from [place] [phone]. Topic: Medicare AWV >> Apr 07, 2022 11:44 AM Devoria Glassing wrote: Reason for CRM: Left message for patient to schedule Annual Wellness Visit.  Please schedule with Nurse Health Advisor Denisa O'Brien-Blaney, LPN at [facility].  Please call [phone] ask for [facility]

## 2022-04-11 ENCOUNTER — Ambulatory Visit: Payer: Medicare PPO

## 2022-04-11 ENCOUNTER — Telehealth: Payer: Self-pay

## 2022-04-11 NOTE — Telephone Encounter (Signed)
Called patient for scheduled AWV. No answer either number. Unable to leave message. Reschedule okay.

## 2022-04-20 ENCOUNTER — Other Ambulatory Visit: Payer: Self-pay | Admitting: Internal Medicine

## 2022-05-01 ENCOUNTER — Telehealth: Payer: Self-pay

## 2022-05-01 ENCOUNTER — Ambulatory Visit (INDEPENDENT_AMBULATORY_CARE_PROVIDER_SITE_OTHER): Payer: Medicare PPO

## 2022-05-01 VITALS — Ht 63.0 in | Wt 122.0 lb

## 2022-05-01 DIAGNOSIS — Z0000 Encounter for general adult medical examination without abnormal findings: Secondary | ICD-10-CM

## 2022-05-01 NOTE — Telephone Encounter (Signed)
No answer when called for scheduled AWV. Unable to leave a message. Okay to reschedule.

## 2022-05-01 NOTE — Progress Notes (Signed)
Subjective:   Joan Ritter is a 75 y.o. female who presents for Medicare Annual (Subsequent) preventive examination.  Review of Systems    No ROS.  Medicare Wellness Virtual Visit.  Visual/audio telehealth visit, UTA vital signs.   See social history for additional risk factors.   Cardiac Risk Factors include: advanced age (>27mn, >>72women)     Objective:    Today's Vitals   05/01/22 1439  Weight: 122 lb (55.3 kg)  Height: '5\' 3"'$  (1.6 m)   Body mass index is 21.61 kg/m.     05/01/2022    2:36 PM 04/08/2021   12:39 PM 04/07/2020   12:42 PM 03/25/2019   12:09 PM 01/10/2019   11:17 AM 02/22/2017    3:22 PM  Advanced Directives  Does Patient Have a Medical Advance Directive? No No No No No No  Does patient want to make changes to medical advance directive?    Yes (MAU/Ambulatory/Procedural Areas - Information given)    Would patient like information on creating a medical advance directive? No - Patient declined No - Patient declined No - Patient declined  No - Patient declined No - Patient declined    Current Medications (verified) Outpatient Encounter Medications as of 05/01/2022  Medication Sig   Acetaminophen (TYLENOL ARTHRITIS PAIN PO) Take by mouth 2 (two) times daily.   acyclovir (ZOVIRAX) 800 MG tablet TAKE 1 TABLET (800 MG TOTAL) BY MOUTH AS DIRECTED.   aspirin 81 MG tablet Take 81 mg by mouth daily.   Calcium Carbonate-Vit D-Min 600-400 MG-UNIT TABS Take 1 tablet by mouth 2 (two) times daily.   cetirizine (ZYRTEC) 10 MG tablet Take 10 mg by mouth daily as needed.    clopidogrel (PLAVIX) 75 MG tablet Take 1 tablet (75 mg total) by mouth daily.   famotidine (PEPCID) 20 MG tablet Take 1 tablet (20 mg total) by mouth daily.   fluticasone (FLONASE) 50 MCG/ACT nasal spray USE 2 SPRAYS EACH NOSTRIL EVERY DAY   Multiple Vitamin (MULTIVITAMIN) tablet Take 1 tablet by mouth daily.   pantoprazole (PROTONIX) 40 MG tablet Take 1 tablet (40 mg total) by mouth daily.   Probiotic  Product (PROBIOTIC-10 PO) Take by mouth.   rosuvastatin (CRESTOR) 20 MG tablet Take 1 tablet (20 mg total) by mouth daily.   No facility-administered encounter medications on file as of 05/01/2022.   Allergies (verified) No known drug allergy   History: Past Medical History:  Diagnosis Date   Allergic rhinitis    seasonal   Elevated TSH    Esophagitis    GERD (gastroesophageal reflux disease)    History of Helicobacter pylori infection 2012   Hypercholesterolemia    Osteopenia    TIA (transient ischemic attack) 2001   Past Surgical History:  Procedure Laterality Date   BREAST BIOPSY Right 2000   neg   BREAST BIOPSY Right 05/18/2017   adipose tissue, coil marker   BREAST EXCISIONAL BIOPSY Right 1970   NEG   COLONOSCOPY  2005   normal    ESOPHAGOGASTRODUODENOSCOPY  2012   normal    HEMORRHOID SURGERY  1980s   Family History  Problem Relation Age of Onset   Lymphoma Father    CVA Mother        h/o cerebral hemorrhage   Brain cancer Brother    Cardiomyopathy Sister        enlarged heart, died age 76- sudden death   Breast cancer Maternal Aunt  x2   Colon cancer Neg Hx    Social History   Socioeconomic History   Marital status: Married    Spouse name: Not on file   Number of children: 2   Years of education: Not on file   Highest education level: Not on file  Occupational History   Not on file  Tobacco Use   Smoking status: Never   Smokeless tobacco: Never  Vaping Use   Vaping Use: Never used  Substance and Sexual Activity   Alcohol use: No    Alcohol/week: 0.0 standard drinks of alcohol   Drug use: No   Sexual activity: Yes    Birth control/protection: Post-menopausal, Abstinence  Other Topics Concern   Not on file  Social History Narrative   Not on file   Social Determinants of Health   Financial Resource Strain: Low Risk  (05/01/2022)   Overall Financial Resource Strain (CARDIA)    Difficulty of Paying Living Expenses: Not hard at all   Food Insecurity: No Food Insecurity (05/01/2022)   Hunger Vital Sign    Worried About Running Out of Food in the Last Year: Never true    Ran Out of Food in the Last Year: Never true  Transportation Needs: No Transportation Needs (05/01/2022)   PRAPARE - Hydrologist (Medical): No    Lack of Transportation (Non-Medical): No  Physical Activity: Sufficiently Active (05/01/2022)   Exercise Vital Sign    Days of Exercise per Week: 7 days    Minutes of Exercise per Session: 40 min  Stress: No Stress Concern Present (05/01/2022)   [place]    Feeling of Stress : Not at all  Social Connections: Unknown (05/01/2022)   Social Connection and Isolation Panel [NHANES]    Frequency of Communication with Friends and Family: More than three times a week    Frequency of Social Gatherings with Friends and Family: More than three times a week    Attends Religious Services: Not on Advertising copywriter or Organizations: Not on file    Attends Archivist Meetings: Not on file    Marital Status: Married   Tobacco Counseling Counseling given: Not Answered  Clinical Intake: Pre-visit preparation completed: Yes        Diabetes: No  How often do you need to have someone help you when you read instructions, pamphlets, or other written materials from your doctor or pharmacy?: 1 - Never  Interpreter Needed?: No    Activities of Daily Living    05/01/2022    2:44 PM  In your present state of health, do you have any difficulty performing the following activities:  Hearing? 1  Vision? 0  Difficulty concentrating or making decisions? 0  Walking or climbing stairs? 0  Dressing or bathing? 0  Doing errands, shopping? 0  Preparing Food and eating ? N  Using the Toilet? N  In the past six months, have you accidently leaked urine? N  Do you have problems with loss of bowel control? N   Managing your Medications? N  Managing your Finances? N  Housekeeping or managing your Housekeeping? N   Patient Care Team: Einar Pheasant, MD as PCP - General (Internal Medicine)  Indicate any recent Medical Services you may have received from other than [facility] providers in the past year (date may be approximate).     Assessment:   This is a routine wellness  examination for Joan Ritter.  Virtual Visit via Telephone Note  I connected with  Joan Ritter on 05/01/22 at  2:00 PM EDT by telephone and verified that I am speaking with the correct person using two identifiers.  Persons participating in the virtual visit: patient/Nurse Health Advisor   I discussed the limitations of performing an evaluation and management service by telehealth. We continued and completed visit with audio only. Some vital signs may be absent or patient reported.   Hearing/Vision screen Hearing Screening - Comments:: Followed by [facility] Vision Screening - Comments:: Followed by Sond[facility] Barges  Wears corrective lenses  Cataract extraction, bilateral They have seen their ophthalmologist in the last 12 months.  Dietary issues and exercise activities discussed: Current Exercise Habits: Home exercise routine, Type of exercise: walking (Silver sneaker online program), Time (Minutes): 60, Frequency (Times/Week): 7, Weekly Exercise (Minutes/Week): 420, Intensity: Mild   Goals Addressed               This Visit's Progress     Patient Stated     Maintain Healthy Lifestyle (pt-stated)        Stay active. Healthy diet.        Depression Screen    05/01/2022    2:42 PM 02/01/2022    8:10 AM 08/03/2021    8:40 AM 04/08/2021   12:38 PM 04/07/2020   12:43 PM 03/25/2019   12:10 PM 02/22/2017    3:24 PM  PHQ 2/9 Scores  PHQ - 2 Score 0 0 0 0 0 0 0  PHQ- 9 Score       0    Fall Risk    05/01/2022    2:43 PM 02/01/2022    8:10 AM 08/03/2021    8:40 AM 04/08/2021   12:42 PM 04/07/2020   12:42 PM   [place] in the past year? 0 0 0 0 0  Number falls in past yr:   0 0 0  Injury with Fall?   0 0   Risk for fall due to :  No Fall Risks     Follow up Falls evaluation completed Falls evaluation completed Falls evaluation completed Falls evaluation completed Falls evaluation completed    [place]: Home free of loose throw rugs in walkways, pet beds, electrical cords, etc? Yes  Adequate lighting in your home to reduce risk of falls? Yes   ASSISTIVE DEVICES UTILIZED TO PREVENT FALLS: Life alert? No  Use of a cane, walker or w/c? No   TIMED UP AND GO: Was the test performed? No .   Cognitive Function: Patient is alert and oriented x3.  Enjoys reading and brain health stimulating games like Words with Friends.     02/22/2017    3:32 PM  MMSE - Mini Mental State Exam  Orientation to time 5  Orientation to Place 5  Registration 3  Attention/ Calculation 5  Recall 3  Language- name 2 objects 2  Language- repeat 1  Language- follow 3 step command 3  Language- read & follow direction 1  Write a sentence 1  Copy design 1  Total score 30        04/07/2020   12:45 PM 03/25/2019   12:19 PM  6CIT Screen  What Year? 0 points 0 points  What month? 0 points 0 points  What time?  0 points  Count back from 20  0 points  Months in reverse 0 points 0  points  Repeat phrase 0 points 0 points  Total Score  0 points    Immunizations Immunization History  Administered Date(s) Administered   Fluad Quad(high Dose 65+) 08/03/2021   Influenza Split 08/18/2014   Influenza, High Dose Seasonal PF 06/28/2017, 07/03/2018, 06/12/2019   Influenza-Unspecified 07/23/2013, 09/01/2015, 07/26/2016   PFIZER(Purple Top)SARS-COV-2 Vaccination 11/12/2019, 12/03/2019, 08/11/2020   Pneumococcal Conjugate-13 05/01/2014   Pneumococcal Polysaccharide-23 08/21/2017   Zoster Recombinat (Shingrix) 02/22/2017, 06/28/2017   Zoster, Live 10/23/2005    TDAP status:  Due, Education has been provided regarding the importance of this vaccine. Advised may receive this vaccine at local pharmacy or Health Dept. Aware to provide a copy of the vaccination record if obtained from local pharmacy or Health Dept. Verbalized acceptance and understanding.  Screening Tests Health Maintenance  Topic Date Due   COVID-19 Vaccine (4 - Booster for Pfizer series) 05/17/2022 (Originally 10/06/2020)   TETANUS/TDAP  07/23/2022 (Originally 11/26/1964)   Hepatitis C Screening  07/23/2022 (Originally 11/27/1963)   INFLUENZA VACCINE  05/23/2022   MAMMOGRAM  06/15/2022   Pneumonia Vaccine 33+ Years old  Completed   DEXA SCAN  Completed   Zoster Vaccines- Shingrix  Completed   HPV VACCINES  Aged Out   COLONOSCOPY (Pts 45-26yr Insurance coverage will need to be confirmed)  Discontinued   Health Maintenance There are no preventive care reminders to display for this patient.  Lung Cancer Screening: (Low Dose CT Chest recommended if Age 76-80years, 30 pack-year currently smoking OR have quit w/in 15years.) does not qualify.   Hepatitis C Screening: deferred per patient request.   Vision Screening: Recommended annual ophthalmology exams for early detection of glaucoma and other disorders of the eye.  Dental Screening: Recommended annual dental exams for proper oral hygiene  Community Resource Referral / Chronic Care Management: CRR required this visit?  No   CCM required this visit?  No      Plan:   Keep all routine maintenance appointments.   I have personally reviewed and noted the following in the patient's chart:   Medical and social history Use of alcohol, tobacco or illicit drugs  Current medications and supplements including opioid prescriptions.  Functional ability and status Nutritional status Physical activity Advanced directives List of other physicians Hospitalizations, surgeries, and ER visits in previous 12 months Vitals Screenings to include  cognitive, depression, and falls Referrals and appointments  In addition, I have reviewed and discussed with patient certain preventive protocols, quality metrics, and best practice recommendations. A written personalized care plan for preventive services as well as general preventive health recommendations were provided to patient.     OVarney Biles LPN   [id number]

## 2022-05-01 NOTE — Patient Instructions (Addendum)
  Ms. Topor , Thank you for taking time to come for your Medicare Wellness Visit. I appreciate your ongoing commitment to your health goals. Please review the following plan we discussed and let me know if I can assist you in the future.   These are the goals we discussed:  Goals       Patient Stated     Maintain Healthy Lifestyle (pt-stated)      Stay active. Healthy diet.         This is a list of the screening recommended for you and due dates:  Health Maintenance  Topic Date Due   COVID-19 Vaccine (4 - Booster for Pfizer series) 05/17/2022*   Tetanus Vaccine  07/23/2022*   Hepatitis C Screening: USPSTF Recommendation to screen - Ages 18-79 yo.  07/23/2022*   Flu Shot  05/23/2022   Mammogram  06/15/2022   Pneumonia Vaccine  Completed   DEXA scan (bone density measurement)  Completed   Zoster (Shingles) Vaccine  Completed   HPV Vaccine  Aged Out   Colon Cancer Screening  Discontinued  *Topic was postponed. The date shown is not the original due date.

## 2022-05-29 ENCOUNTER — Other Ambulatory Visit: Payer: Self-pay | Admitting: Internal Medicine

## 2022-06-16 ENCOUNTER — Ambulatory Visit
Admission: RE | Admit: 2022-06-16 | Discharge: 2022-06-16 | Disposition: A | Discharge status: Home / Self Care | Payer: Medicare PPO | Source: Ambulatory Visit | Attending: Internal Medicine | Admitting: Internal Medicine

## 2022-06-16 ENCOUNTER — Other Ambulatory Visit: Payer: Self-pay | Admitting: Internal Medicine

## 2022-06-16 DIAGNOSIS — R928 Other abnormal and inconclusive findings on diagnostic imaging of breast: Secondary | ICD-10-CM | POA: Insufficient documentation

## 2022-06-16 DIAGNOSIS — R922 Inconclusive mammogram: Secondary | ICD-10-CM | POA: Diagnosis not present

## 2022-06-16 DIAGNOSIS — N6311 Unspecified lump in the right breast, upper outer quadrant: Secondary | ICD-10-CM | POA: Diagnosis not present

## 2022-07-11 DIAGNOSIS — Z85828 Personal history of other malignant neoplasm of skin: Secondary | ICD-10-CM | POA: Diagnosis not present

## 2022-07-11 DIAGNOSIS — C44612 Basal cell carcinoma of skin of right upper limb, including shoulder: Secondary | ICD-10-CM | POA: Diagnosis not present

## 2022-07-11 DIAGNOSIS — L578 Other skin changes due to chronic exposure to nonionizing radiation: Secondary | ICD-10-CM | POA: Diagnosis not present

## 2022-07-11 DIAGNOSIS — L570 Actinic keratosis: Secondary | ICD-10-CM | POA: Diagnosis not present

## 2022-07-11 DIAGNOSIS — Z86018 Personal history of other benign neoplasm: Secondary | ICD-10-CM | POA: Diagnosis not present

## 2022-07-11 DIAGNOSIS — Z872 Personal history of diseases of the skin and subcutaneous tissue: Secondary | ICD-10-CM | POA: Diagnosis not present

## 2022-07-11 DIAGNOSIS — D485 Neoplasm of uncertain behavior of skin: Secondary | ICD-10-CM | POA: Diagnosis not present

## 2022-07-17 ENCOUNTER — Other Ambulatory Visit: Payer: Self-pay | Admitting: Internal Medicine

## 2022-08-07 DIAGNOSIS — H26491 Other secondary cataract, right eye: Secondary | ICD-10-CM | POA: Diagnosis not present

## 2022-08-07 DIAGNOSIS — L988 Other specified disorders of the skin and subcutaneous tissue: Secondary | ICD-10-CM | POA: Diagnosis not present

## 2022-08-07 DIAGNOSIS — C4491 Basal cell carcinoma of skin, unspecified: Secondary | ICD-10-CM | POA: Diagnosis not present

## 2022-08-07 DIAGNOSIS — Z961 Presence of intraocular lens: Secondary | ICD-10-CM | POA: Diagnosis not present

## 2022-08-09 ENCOUNTER — Other Ambulatory Visit (INDEPENDENT_AMBULATORY_CARE_PROVIDER_SITE_OTHER): Payer: Medicare PPO

## 2022-08-09 DIAGNOSIS — N1831 Chronic kidney disease, stage 3a: Secondary | ICD-10-CM | POA: Diagnosis not present

## 2022-08-09 DIAGNOSIS — E7800 Pure hypercholesterolemia, unspecified: Secondary | ICD-10-CM | POA: Diagnosis not present

## 2022-08-09 LAB — BASIC METABOLIC PANEL
BUN: 13 mg/dL (ref 6–23)
CO2: 30 mEq/L (ref 19–32)
Calcium: 9 mg/dL (ref 8.4–10.5)
Chloride: 107 mEq/L (ref 96–112)
Creatinine, Ser: 0.91 mg/dL (ref 0.40–1.20)
GFR: 61.2 mL/min (ref >60.00)
Glucose, Bld: 91 mg/dL (ref 70–99)
Potassium: 3.8 mEq/L (ref 3.5–5.1)
Sodium: 143 mEq/L (ref 135–145)

## 2022-08-09 LAB — CBC WITH DIFFERENTIAL/PLATELET
Basophils Absolute: 0.1 10*3/uL (ref 0.0–0.1)
Basophils Relative: 0.8 % (ref 0.0–3.0)
Eosinophils Absolute: 0.3 10*3/uL (ref 0.0–0.7)
Eosinophils Relative: 3.8 % (ref 0.0–5.0)
HCT: 39.5 % (ref 36.0–46.0)
Hemoglobin: 13.4 g/dL (ref 12.0–15.0)
Lymphocytes Relative: 30.2 % (ref 12.0–46.0)
Lymphs Abs: 2.1 10*3/uL (ref 0.7–4.0)
MCHC: 33.9 g/dL (ref 30.0–36.0)
MCV: 94.8 fl (ref 78.0–100.0)
Monocytes Absolute: 0.6 10*3/uL (ref 0.1–1.0)
Monocytes Relative: 8.3 % (ref 3.0–12.0)
Neutro Abs: 3.9 10*3/uL (ref 1.4–7.7)
Neutrophils Relative %: 56.9 % (ref 43.0–77.0)
Platelets: 181 10*3/uL (ref 150.0–400.0)
RBC: 4.17 Mil/uL (ref 3.87–5.11)
RDW: 12.8 % (ref 11.5–15.5)
WBC: 6.8 10*3/uL (ref 4.0–10.5)

## 2022-08-09 LAB — HEPATIC FUNCTION PANEL
ALT: 15 U/L (ref 0–35)
AST: 17 U/L (ref 0–37)
Albumin: 3.9 g/dL (ref 3.5–5.2)
Alkaline Phosphatase: 53 U/L (ref 39–117)
Bilirubin, Direct: 0.1 mg/dL (ref 0.0–0.3)
Total Bilirubin: 0.3 mg/dL (ref 0.2–1.2)
Total Protein: 5.9 g/dL — ABNORMAL LOW (ref 6.0–8.3)

## 2022-08-09 LAB — LIPID PANEL
Cholesterol: 139 mg/dL (ref 0–200)
HDL: 62.4 mg/dL (ref >39.00)
LDL Cholesterol: 62 mg/dL (ref 0–99)
NonHDL: 76.4
Total CHOL/HDL Ratio: 2
Triglycerides: 74 mg/dL (ref 0.0–149.0)
VLDL: 14.8 mg/dL (ref 0.0–40.0)

## 2022-08-09 LAB — TSH: TSH: 4.34 u[IU]/mL (ref 0.35–5.50)

## 2022-08-14 ENCOUNTER — Ambulatory Visit: Payer: Medicare PPO | Admitting: Internal Medicine

## 2022-08-14 ENCOUNTER — Encounter: Payer: Self-pay | Admitting: Internal Medicine

## 2022-08-14 VITALS — BP 112/78 | HR 62 | Temp 97.9°F | Ht 63.0 in | Wt 125.0 lb

## 2022-08-14 DIAGNOSIS — N1831 Chronic kidney disease, stage 3a: Secondary | ICD-10-CM | POA: Diagnosis not present

## 2022-08-14 DIAGNOSIS — M79606 Pain in leg, unspecified: Secondary | ICD-10-CM

## 2022-08-14 DIAGNOSIS — Z8673 Personal history of transient ischemic attack (TIA), and cerebral infarction without residual deficits: Secondary | ICD-10-CM | POA: Diagnosis not present

## 2022-08-14 DIAGNOSIS — Z23 Encounter for immunization: Secondary | ICD-10-CM | POA: Diagnosis not present

## 2022-08-14 DIAGNOSIS — K219 Gastro-esophageal reflux disease without esophagitis: Secondary | ICD-10-CM

## 2022-08-14 DIAGNOSIS — E7800 Pure hypercholesterolemia, unspecified: Secondary | ICD-10-CM | POA: Diagnosis not present

## 2022-08-14 LAB — CK: Total CK: 81 U/L (ref 7–177)

## 2022-08-14 LAB — SEDIMENTATION RATE: Sed Rate: 3 mm/hr (ref 0–30)

## 2022-08-14 NOTE — Progress Notes (Signed)
Patient ID: Joan Ritter, female   DOB: [birth year], 76 y.o.   MRN: [id number]   Subjective:    Patient ID: Joan Ritter, female    DOB: [birth year], 76 y.o.   MRN: [id number]   Patient here for  Chief Complaint  Patient presents with   Follow-up   .   HPI Reports she is doing relatively well.  Stays active.  No chest pain or sob reported.  Has noticed some pain - legs.  Aching.  No pain with walking.  If up on a long time - aggravates.  Discussed possible etiologies.  Discussed statin medication.  She reports lately has been better.  Wants to hold on changing any medication at this time.  Eating.  No nausea or vomiting.  No abdominal pain reported.  Some vaginal discomfort with intercourse.  Discussed treatment options.     Past Medical History:  Diagnosis Date   Allergic rhinitis    seasonal   Elevated TSH    Esophagitis    GERD (gastroesophageal reflux disease)    History of Helicobacter pylori infection 2012   Hypercholesterolemia    Osteopenia    TIA (transient ischemic attack) 2001   Past Surgical History:  Procedure Laterality Date   BREAST BIOPSY Right 2000   neg   BREAST BIOPSY Right 05/18/2017   adipose tissue, coil marker   BREAST EXCISIONAL BIOPSY Right 1970   NEG   COLONOSCOPY  2005   normal    ESOPHAGOGASTRODUODENOSCOPY  2012   normal    HEMORRHOID SURGERY  1980s   Family History  Problem Relation Age of Onset   Lymphoma Father    CVA Mother        h/o cerebral hemorrhage   Brain cancer Brother    Cardiomyopathy Sister        enlarged heart, died age 55 - sudden death   Breast cancer Maternal Aunt        x2   Colon cancer Neg Hx    Social History   Socioeconomic History   Marital status: Married    Spouse name: Not on file   Number of children: 2   Years of education: Not on file   Highest education level: Not on file  Occupational History   Not on file  Tobacco Use   Smoking status: Never   Smokeless tobacco: Never  Vaping Use   Vaping  Use: Never used  Substance and Sexual Activity   Alcohol use: No    Alcohol/week: 0.0 standard drinks of alcohol   Drug use: No   Sexual activity: Yes    Birth control/protection: Post-menopausal, Abstinence  Other Topics Concern   Not on file  Social History Narrative   Not on file   Social Determinants of Health   Financial Resource Strain: Low Risk  (05/01/2022)   Overall Financial Resource Strain (CARDIA)    Difficulty of Paying Living Expenses: Not hard at all  Food Insecurity: No Food Insecurity (05/01/2022)   Hunger Vital Sign    Worried About Running Out of Food in the Last Year: Never true    Ran Out of Food in the Last Year: Never true  Transportation Needs: No Transportation Needs (05/01/2022)   PRAPARE - Hydrologist (Medical): No    Lack of Transportation (Non-Medical): No  Physical Activity: Sufficiently Active (05/01/2022)   Exercise Vital Sign    Days of Exercise per Week: 7 days    Minutes of Exercise  per Session: 40 min  Stress: No Stress Concern Present (05/01/2022)   [place]    Feeling of Stress : Not at all  Social Connections: Unknown (05/01/2022)   Social Connection and Isolation Panel [NHANES]    Frequency of Communication with Friends and Family: More than three times a week    Frequency of Social Gatherings with Friends and Family: More than three times a week    Attends Religious Services: Not on Advertising copywriter or Organizations: Not on file    Attends Archivist Meetings: Not on file    Marital Status: Married     Review of Systems  Constitutional:  Negative for appetite change and unexpected weight change.  HENT:  Negative for congestion and sinus pressure.   Respiratory:  Negative for cough, chest tightness and shortness of breath.   Cardiovascular:  Negative for chest pain, palpitations and leg swelling.  Gastrointestinal:   Negative for abdominal pain, diarrhea, nausea and vomiting.  Genitourinary:  Negative for difficulty urinating and dysuria.  Musculoskeletal:  Negative for joint swelling.       Leg aching as outlined.   Skin:  Negative for color change and rash.  Neurological:  Negative for dizziness and headaches.  Psychiatric/Behavioral:  Negative for agitation and dysphoric mood.        Objective:     BP 112/78 (BP Location: Left Arm, Patient Position: Sitting, Cuff Size: Normal)   Pulse 62   Temp 97.9 F (36.6 C) (Oral)   Ht _0  (1.6 m)   Wt 125 lb (56.7 kg)   SpO2 98%   BMI 22.14 kg/m  Wt Readings from Last 3 Encounters:  08/14/22 125 lb (56.7 kg)  05/01/22 122 lb (55.3 kg)  02/01/22 122 lb (55.3 kg)    Physical Exam Vitals reviewed.  Constitutional:      General: She is not in acute distress.    Appearance: Normal appearance.  HENT:     Head: Normocephalic and atraumatic.     Right Ear: External ear normal.     Left Ear: External ear normal.  Eyes:     General: No scleral icterus.       Right eye: No discharge.        Left eye: No discharge.     Conjunctiva/sclera: Conjunctivae normal.  Neck:     Thyroid: No thyromegaly.  Cardiovascular:     Rate and Rhythm: Normal rate and regular rhythm.  Pulmonary:     Effort: No respiratory distress.     Breath sounds: Normal breath sounds. No wheezing.  Abdominal:     General: Bowel sounds are normal.     Palpations: Abdomen is soft.     Tenderness: There is no abdominal tenderness.  Musculoskeletal:        General: No swelling or tenderness.     Cervical back: Neck supple. No tenderness.     Comments: Negative SLR.  No motor weakness.   Lymphadenopathy:     Cervical: No cervical adenopathy.  Skin:    Findings: No erythema or rash.  Neurological:     Mental Status: She is alert.  Psychiatric:        Mood and Affect: Mood normal.        Behavior: Behavior normal.      Outpatient Encounter Medications as of 08/14/2022   Medication Sig   Acetaminophen (TYLENOL ARTHRITIS PAIN PO) Take by mouth 2 (  two) times daily.   acyclovir (ZOVIRAX) 800 MG tablet TAKE 1 TABLET (800 MG TOTAL) BY MOUTH AS DIRECTED.   aspirin 81 MG tablet Take 81 mg by mouth daily.   Calcium Carbonate-Vit D-Min 600-400 MG-UNIT TABS Take 1 tablet by mouth 2 (two) times daily.   cetirizine (ZYRTEC) 10 MG tablet Take 10 mg by mouth daily as needed.    clopidogrel (PLAVIX) 75 MG tablet Take 1 tablet (75 mg total) by mouth daily.   famotidine (PEPCID) 20 MG tablet Take 1 tablet (20 mg total) by mouth daily.   fluticasone (FLONASE) 50 MCG/ACT nasal spray USE 2 SPRAYS EACH NOSTRIL EVERY DAY   Multiple Vitamin (MULTIVITAMIN) tablet Take 1 tablet by mouth daily.   pantoprazole (PROTONIX) 40 MG tablet Take 1 tablet (40 mg total) by mouth daily.   Probiotic Product (PROBIOTIC-10 PO) Take by mouth.   rosuvastatin (CRESTOR) 20 MG tablet Take 1 tablet (20 mg total) by mouth daily.   No facility-administered encounter medications on file as of 08/14/2022.     Lab Results  Component Value Date   WBC 6.8 08/09/2022   HGB 13.4 08/09/2022   HCT 39.5 08/09/2022   PLT 181.0 08/09/2022   GLUCOSE 91 08/09/2022   CHOL 139 08/09/2022   TRIG 74.0 08/09/2022   HDL 62.40 08/09/2022   LDLCALC 62 08/09/2022   ALT 15 08/09/2022   AST 17 08/09/2022   NA 143 08/09/2022   K 3.8 08/09/2022   CL 107 08/09/2022   CREATININE 0.91 08/09/2022   BUN 13 08/09/2022   CO2 30 08/09/2022   TSH 4.34 08/09/2022    MM DIAG BREAST TOMO BILATERAL  Result Date: 06/16/2022 CLINICAL DATA:  BI-RADS 3 follow-up of a RIGHT breast mass, initiated August 2021 EXAM: DIGITAL DIAGNOSTIC BILATERAL MAMMOGRAM WITH TOMOSYNTHESIS; ULTRASOUND RIGHT BREAST LIMITED TECHNIQUE: Bilateral digital diagnostic mammography and breast tomosynthesis was performed.; Targeted ultrasound examination of the right breast was performed COMPARISON:  Previous exam(s). ACR Breast Density Category c: The breast  tissue is heterogeneously dense, which may obscure small masses. FINDINGS: Diagnostic images of the RIGHT breast demonstrate no new suspicious findings. No suspicious mass, distortion, or microcalcifications are identified to suggest presence of malignancy bilaterally. Targeted ultrasound performed of the RIGHT breast. In the retroareolar breast at 1 o'clock, there is revisualization of an oval circumscribed hypoechoic mass. It measures 8 x 2 x 4 mm, previously 8 x 2 x 5 mm. This is stable in comparison to prior RIGHT IMPRESSION: 1. Stable RIGHT breast mass for greater than 2 years, consistent with a benign etiology. 2. No mammographic evidence of malignancy bilaterally. RECOMMENDATION: Screening mammogram in one year.(Code:SM-B-01Y) I have discussed the findings and recommendations with the patient. If applicable, a reminder letter will be sent to the patient regarding the next appointment. BI-RADS CATEGORY  2: Benign. Electronically Signed   By: Valentino Saxon M.D.   On: 06/16/2022 11:58   US BREAST LTD UNI RIGHT INC AXILLA  Result Date: 06/16/2022 CLINICAL DATA:  BI-RADS 3 follow-up of a RIGHT breast mass, initiated August 2021 EXAM: DIGITAL DIAGNOSTIC BILATERAL MAMMOGRAM WITH TOMOSYNTHESIS; ULTRASOUND RIGHT BREAST LIMITED TECHNIQUE: Bilateral digital diagnostic mammography and breast tomosynthesis was performed.; Targeted ultrasound examination of the right breast was performed COMPARISON:  Previous exam(s). ACR Breast Density Category c: The breast tissue is heterogeneously dense, which may obscure small masses. FINDINGS: Diagnostic images of the RIGHT breast demonstrate no new suspicious findings. No suspicious mass, distortion, or microcalcifications are identified to suggest presence of malignancy  bilaterally. Targeted ultrasound performed of the RIGHT breast. In the retroareolar breast at 1 o'clock, there is revisualization of an oval circumscribed hypoechoic mass. It measures 8 x 2 x 4 mm,  previously 8 x 2 x 5 mm. This is stable in comparison to prior RIGHT IMPRESSION: 1. Stable RIGHT breast mass for greater than 2 years, consistent with a benign etiology. 2. No mammographic evidence of malignancy bilaterally. RECOMMENDATION: Screening mammogram in one year.(Code:SM-B-01Y) I have discussed the findings and recommendations with the patient. If applicable, a reminder letter will be sent to the patient regarding the next appointment. BI-RADS CATEGORY  2: Benign. Electronically Signed   By: Valentino Saxon M.D.   On: 06/16/2022 11:58      Assessment & Plan:   Problem List Items Addressed This Visit     CKD (chronic kidney disease) stage 3, GFR 30-59 ml/min (HCC)    Avoid antiinflammatories.  Stay hydrated.  Follow metabolic panel.       GERD (gastroesophageal reflux disease)    No upper symptoms reported.  On protonix.       History of TIA (transient ischemic attack)    Continue plavix and crestor.  Follow.       Hypercholesterolemia    On crestor 58m q day.  Continue low cholesterol diet and exercise.  Follow lipid panel and liver function tests.       Relevant Orders   Hepatic function panel   Basic metabolic panel   Lipid panel   Leg pain    Discussed possible etiologies.  Support shoes, socks.  Stretches.  Discussed the possibility of holding crestor for a couple of weeks and see if improves.  Since has been better lately, she wants to hold on doing anything at this time.  Will follow.  Call with update.  Check esr and ck.       Relevant Orders   Sedimentation rate (Completed)   CK (Creatine Kinase) (Completed)   Other Visit Diagnoses     Need for immunization against influenza    -  Primary   Relevant Orders   Flu Vaccine QUAD High Dose(Fluad) (Completed)        CEinar Pheasant MD

## 2022-08-20 ENCOUNTER — Encounter: Payer: Self-pay | Admitting: Internal Medicine

## 2022-08-20 NOTE — Assessment & Plan Note (Signed)
No upper symptoms reported.  On protonix.   

## 2022-08-20 NOTE — Assessment & Plan Note (Signed)
Continue plavix and crestor.  Follow.

## 2022-08-20 NOTE — Assessment & Plan Note (Signed)
On crestor '20mg'$  q day.  Continue low cholesterol diet and exercise.  Follow lipid panel and liver function tests.

## 2022-08-20 NOTE — Assessment & Plan Note (Signed)
Discussed possible etiologies.  Support shoes, socks.  Stretches.  Discussed the possibility of holding crestor for a couple of weeks and see if improves.  Since has been better lately, she wants to hold on doing anything at this time.  Will follow.  Call with update.  Check esr and ck.

## 2022-08-20 NOTE — Assessment & Plan Note (Signed)
Avoid antiinflammatories.  Stay hydrated.  Follow metabolic panel.   

## 2022-09-07 ENCOUNTER — Other Ambulatory Visit: Payer: Self-pay | Admitting: Internal Medicine

## 2022-10-23 ENCOUNTER — Other Ambulatory Visit: Payer: Self-pay | Admitting: Internal Medicine

## 2022-10-24 ENCOUNTER — Other Ambulatory Visit: Payer: Self-pay | Admitting: Internal Medicine

## 2022-10-30 ENCOUNTER — Other Ambulatory Visit: Payer: Self-pay | Admitting: Internal Medicine

## 2022-11-16 DIAGNOSIS — D492 Neoplasm of unspecified behavior of bone, soft tissue, and skin: Secondary | ICD-10-CM | POA: Diagnosis not present

## 2022-11-16 DIAGNOSIS — Z85828 Personal history of other malignant neoplasm of skin: Secondary | ICD-10-CM | POA: Diagnosis not present

## 2022-11-16 DIAGNOSIS — Z872 Personal history of diseases of the skin and subcutaneous tissue: Secondary | ICD-10-CM | POA: Diagnosis not present

## 2022-11-16 DIAGNOSIS — Z86018 Personal history of other benign neoplasm: Secondary | ICD-10-CM | POA: Diagnosis not present

## 2022-11-16 DIAGNOSIS — L578 Other skin changes due to chronic exposure to nonionizing radiation: Secondary | ICD-10-CM | POA: Diagnosis not present

## 2022-12-18 ENCOUNTER — Other Ambulatory Visit: Payer: Self-pay | Admitting: Family

## 2022-12-18 ENCOUNTER — Other Ambulatory Visit: Payer: Self-pay | Admitting: Internal Medicine

## 2022-12-20 ENCOUNTER — Other Ambulatory Visit: Payer: Self-pay | Admitting: Internal Medicine

## 2022-12-27 ENCOUNTER — Telehealth: Payer: Self-pay | Admitting: Internal Medicine

## 2022-12-27 NOTE — Telephone Encounter (Signed)
Patient scheduled.

## 2022-12-27 NOTE — Telephone Encounter (Signed)
Ok to work in tomorrow? Just wanted to confirm appt has not been offered to any one else?

## 2022-12-27 NOTE — Telephone Encounter (Signed)
Patient has been experiencing vaginal itchiness and she noticed a little red spot in that area as well, Has been going on for several days. She wanted to know what Dr Nicki Reaper recommends. She wanted to see Dr Nicki Reaper but an appointment isnt available until next week. She is requesting a call back at [phone]

## 2022-12-27 NOTE — Telephone Encounter (Signed)
Ok to work in Architectural technologist.

## 2022-12-28 ENCOUNTER — Ambulatory Visit: Payer: Medicare PPO | Admitting: Internal Medicine

## 2022-12-28 ENCOUNTER — Other Ambulatory Visit (HOSPITAL_COMMUNITY)
Admission: RE | Admit: 2022-12-28 | Discharge: 2022-12-28 | Disposition: A | Discharge status: Home / Self Care | Payer: Medicare PPO | Source: Ambulatory Visit | Attending: Internal Medicine | Admitting: Internal Medicine

## 2022-12-28 ENCOUNTER — Encounter: Payer: Self-pay | Admitting: Internal Medicine

## 2022-12-28 VITALS — BP 112/70 | HR 78 | Temp 98.0°F | Resp 16 | Ht 63.0 in | Wt 120.0 lb

## 2022-12-28 DIAGNOSIS — K219 Gastro-esophageal reflux disease without esophagitis: Secondary | ICD-10-CM | POA: Diagnosis not present

## 2022-12-28 DIAGNOSIS — N1831 Chronic kidney disease, stage 3a: Secondary | ICD-10-CM | POA: Diagnosis not present

## 2022-12-28 DIAGNOSIS — N760 Acute vaginitis: Secondary | ICD-10-CM | POA: Insufficient documentation

## 2022-12-28 DIAGNOSIS — N898 Other specified noninflammatory disorders of vagina: Secondary | ICD-10-CM

## 2022-12-28 NOTE — Progress Notes (Signed)
Subjective:    Patient ID: Joan Ritter, female    DOB: August 29, 1946, 77 y.o.   MRN: [id number]  Patient here for  Chief Complaint  Patient presents with   Vaginitis    HPI Work in appt - vaginal itching and red area.  Symptoms started 5-6 days ago.  Describes vaginal irritation (both internal and external).  No bleeding.  No discharge.  No urine symptoms.  Otherwise doing well. No abdominal pain.  Eating.     Past Medical History:  Diagnosis Date   Allergic rhinitis    seasonal   Elevated TSH    Esophagitis    GERD (gastroesophageal reflux disease)    History of Helicobacter pylori infection 2012   Hypercholesterolemia    Osteopenia    TIA (transient ischemic attack) 2001   Past Surgical History:  Procedure Laterality Date   BREAST BIOPSY Right 2000   neg   BREAST BIOPSY Right 05/18/2017   adipose tissue, coil marker   BREAST EXCISIONAL BIOPSY Right 1970   NEG   COLONOSCOPY  2005   normal    ESOPHAGOGASTRODUODENOSCOPY  2012   normal    HEMORRHOID SURGERY  1980s   Family History  Problem Relation Age of Onset   Lymphoma Father    CVA Mother        h/o cerebral hemorrhage   Brain cancer Brother    Cardiomyopathy Sister        enlarged heart, died age 65 - sudden death   Breast cancer Maternal Aunt        x2   Colon cancer Neg Hx    Social History   Socioeconomic History   Marital status: Married    Spouse name: Not on file   Number of children: 2   Years of education: Not on file   Highest education level: Not on file  Occupational History   Not on file  Tobacco Use   Smoking status: Never   Smokeless tobacco: Never  Vaping Use   Vaping Use: Never used  Substance and Sexual Activity   Alcohol use: No    Alcohol/week: 0.0 standard drinks of alcohol   Drug use: No   Sexual activity: Yes    Birth control/protection: Post-menopausal, Abstinence  Other Topics Concern   Not on file  Social History Narrative   Not on file   Social Determinants of  Health   Financial Resource Strain: Low Risk  (05/01/2022)   Overall Financial Resource Strain (CARDIA)    Difficulty of Paying Living Expenses: Not hard at all  Food Insecurity: No Food Insecurity (05/01/2022)   Hunger Vital Sign    Worried About Running Out of Food in the Last Year: Never true    Ran Out of Food in the Last Year: Never true  Transportation Needs: No Transportation Needs (05/01/2022)   PRAPARE - Hydrologist (Medical): No    Lack of Transportation (Non-Medical): No  Physical Activity: Sufficiently Active (05/01/2022)   Exercise Vital Sign    Days of Exercise per Week: 7 days    Minutes of Exercise per Session: 40 min  Stress: No Stress Concern Present (05/01/2022)   [place]    Feeling of Stress : Not at all  Social Connections: Unknown (05/01/2022)   Social Connection and Isolation Panel [NHANES]    Frequency of Communication with Friends and Family: More than three times a week    Frequency  of Social Gatherings with Friends and Family: More than three times a week    Attends Religious Services: Not on file    Active Member of Clubs or Organizations: Not on file    Attends Archivist Meetings: Not on file    Marital Status: Married     Review of Systems  Constitutional:  Negative for appetite change, fever and unexpected weight change.  HENT:  Negative for congestion and sinus pressure.   Respiratory:  Negative for cough, chest tightness and shortness of breath.   Cardiovascular:  Negative for chest pain and palpitations.  Gastrointestinal:  Negative for abdominal pain, diarrhea, nausea and vomiting.  Genitourinary:  Negative for difficulty urinating and dysuria.  Musculoskeletal:  Negative for joint swelling and myalgias.  Skin:  Negative for color change and rash.  Neurological:  Negative for dizziness and headaches.  Psychiatric/Behavioral:  Negative for  agitation and dysphoric mood.        Objective:     BP 112/70   Pulse 78   Temp 98 F (36.7 C)   Resp 16   Ht 5\' 3"  (1.6 m)   Wt 120 lb (54.4 kg)   SpO2 98%   BMI 21.26 kg/m  Wt Readings from Last 3 Encounters:  12/28/22 120 lb (54.4 kg)  08/14/22 125 lb (56.7 kg)  05/01/22 122 lb (55.3 kg)    Physical Exam Vitals reviewed.  Constitutional:      General: She is not in acute distress.    Appearance: Normal appearance.  HENT:     Head: Normocephalic and atraumatic.     Right Ear: External ear normal.     Left Ear: External ear normal.  Eyes:     General: No scleral icterus.       Right eye: No discharge.        Left eye: No discharge.     Conjunctiva/sclera: Conjunctivae normal.  Neck:     Thyroid: No thyromegaly.  Cardiovascular:     Rate and Rhythm: Normal rate and regular rhythm.  Pulmonary:     Effort: No respiratory distress.     Breath sounds: Normal breath sounds. No wheezing.  Abdominal:     General: Bowel sounds are normal.     Palpations: Abdomen is soft.     Tenderness: There is no abdominal tenderness.  Genitourinary:    Comments: Normal external genitalia.  Vaginal vault without lesions.  Could not appreciate any adnexal masses or tenderness.   Musculoskeletal:        General: No swelling or tenderness.     Cervical back: Neck supple. No tenderness.  Lymphadenopathy:     Cervical: No cervical adenopathy.  Skin:    Findings: No erythema or rash.  Neurological:     Mental Status: She is alert.  Psychiatric:        Mood and Affect: Mood normal.        Behavior: Behavior normal.      Outpatient Encounter Medications as of 12/28/2022  Medication Sig   Acetaminophen (TYLENOL ARTHRITIS PAIN PO) Take by mouth 2 (two) times daily.   acyclovir (ZOVIRAX) 800 MG tablet TAKE 1 TABLET (800 MG TOTAL) BY MOUTH AS DIRECTED.   aspirin 81 MG tablet Take 81 mg by mouth daily.   Calcium Carbonate-Vit D-Min 600-400 MG-UNIT TABS Take 1 tablet by mouth 2 (two)  times daily.   cetirizine (ZYRTEC) 10 MG tablet Take 10 mg by mouth daily as needed.    clopidogrel (PLAVIX) 75  MG tablet Take 1 tablet (75 mg total) by mouth daily.   famotidine (PEPCID) 20 MG tablet Take 1 tablet (20 mg total) by mouth daily.   fluticasone (FLONASE) 50 MCG/ACT nasal spray USE 2 SPRAYS EACH NOSTRIL EVERY DAY   Multiple Vitamin (MULTIVITAMIN) tablet Take 1 tablet by mouth daily.   pantoprazole (PROTONIX) 40 MG tablet Take 1 tablet (40 mg total) by mouth daily.   Probiotic Product (PROBIOTIC-10 PO) Take by mouth.   rosuvastatin (CRESTOR) 20 MG tablet Take 1 tablet (20 mg total) by mouth daily.   No facility-administered encounter medications on file as of 12/28/2022.     Lab Results  Component Value Date   WBC 6.8 08/09/2022   HGB 13.4 08/09/2022   HCT 39.5 08/09/2022   PLT 181.0 08/09/2022   GLUCOSE 91 08/09/2022   CHOL 139 08/09/2022   TRIG 74.0 08/09/2022   HDL 62.40 08/09/2022   LDLCALC 62 08/09/2022   ALT 15 08/09/2022   AST 17 08/09/2022   NA 143 08/09/2022   K 3.8 08/09/2022   CL 107 08/09/2022   CREATININE 0.91 08/09/2022   BUN 13 08/09/2022   CO2 30 08/09/2022   TSH 4.34 08/09/2022    MM DIAG BREAST TOMO BILATERAL  Result Date: 06/16/2022 CLINICAL DATA:  BI-RADS 3 follow-up of a RIGHT breast mass, initiated August 2021 EXAM: DIGITAL DIAGNOSTIC BILATERAL MAMMOGRAM WITH TOMOSYNTHESIS; ULTRASOUND RIGHT BREAST LIMITED TECHNIQUE: Bilateral digital diagnostic mammography and breast tomosynthesis was performed.; Targeted ultrasound examination of the right breast was performed COMPARISON:  Previous exam(s). ACR Breast Density Category c: The breast tissue is heterogeneously dense, which may obscure small masses. FINDINGS: Diagnostic images of the RIGHT breast demonstrate no new suspicious findings. No suspicious mass, distortion, or microcalcifications are identified to suggest presence of malignancy bilaterally. Targeted ultrasound performed of the RIGHT breast.  In the retroareolar breast at 1 o'clock, there is revisualization of an oval circumscribed hypoechoic mass. It measures 8 x 2 x 4 mm, previously 8 x 2 x 5 mm. This is stable in comparison to prior RIGHT IMPRESSION: 1. Stable RIGHT breast mass for greater than 2 years, consistent with a benign etiology. 2. No mammographic evidence of malignancy bilaterally. RECOMMENDATION: Screening mammogram in one year.(Code:SM-B-01Y) I have discussed the findings and recommendations with the patient. If applicable, a reminder letter will be sent to the patient regarding the next appointment. BI-RADS CATEGORY  2: Benign. Electronically Signed   By: Valentino Saxon M.D.   On: 06/16/2022 11:58   US BREAST LTD UNI RIGHT INC AXILLA  Result Date: 06/16/2022 CLINICAL DATA:  BI-RADS 3 follow-up of a RIGHT breast mass, initiated August 2021 EXAM: DIGITAL DIAGNOSTIC BILATERAL MAMMOGRAM WITH TOMOSYNTHESIS; ULTRASOUND RIGHT BREAST LIMITED TECHNIQUE: Bilateral digital diagnostic mammography and breast tomosynthesis was performed.; Targeted ultrasound examination of the right breast was performed COMPARISON:  Previous exam(s). ACR Breast Density Category c: The breast tissue is heterogeneously dense, which may obscure small masses. FINDINGS: Diagnostic images of the RIGHT breast demonstrate no new suspicious findings. No suspicious mass, distortion, or microcalcifications are identified to suggest presence of malignancy bilaterally. Targeted ultrasound performed of the RIGHT breast. In the retroareolar breast at 1 o'clock, there is revisualization of an oval circumscribed hypoechoic mass. It measures 8 x 2 x 4 mm, previously 8 x 2 x 5 mm. This is stable in comparison to prior RIGHT IMPRESSION: 1. Stable RIGHT breast mass for greater than 2 years, consistent with a benign etiology. 2. No mammographic evidence of malignancy bilaterally.  RECOMMENDATION: Screening mammogram in one year.(Code:SM-B-01Y) I have discussed the findings and  recommendations with the patient. If applicable, a reminder letter will be sent to the patient regarding the next appointment. BI-RADS CATEGORY  2: Benign. Electronically Signed   By: Valentino Saxon M.D.   On: 06/16/2022 11:58      Assessment & Plan:  Acute vaginitis -     Cervicovaginal ancillary only  Stage 3a chronic kidney disease ([place]) Assessment & Plan: Avoid antiinflammatories.  Stay hydrated.  Follow metabolic panel.    Gastroesophageal reflux disease without esophagitis Assessment & Plan: No upper symptoms reported.  On protonix.    Vaginal irritation Assessment & Plan: Symptoms as outlined.  Discussed decreased estrogen and changes.  KOH/Wet prep obtained.  Further treatment pending results.       Einar Pheasant, MD

## 2022-12-29 LAB — CERVICOVAGINAL ANCILLARY ONLY
Bacterial Vaginitis (gardnerella): NEGATIVE
Candida Glabrata: NEGATIVE
Comment: NEGATIVE
Comment: NEGATIVE
Comment: NEGATIVE
Trichomonas: NEGATIVE

## 2023-01-01 ENCOUNTER — Telehealth: Payer: Self-pay

## 2023-01-01 NOTE — Telephone Encounter (Signed)
Noted  

## 2023-01-01 NOTE — Telephone Encounter (Signed)
Pt called back and I read the message to her and she verbalized understanding 

## 2023-01-01 NOTE — Telephone Encounter (Signed)
LM FOR PT TO [facility] ::  Einar Pheasant, MD  [facility] Please notify Thresa that her vaginal swab was negative.  Let me know if persistent symptoms - or current sympotms.

## 2023-01-06 ENCOUNTER — Encounter: Payer: Self-pay | Admitting: Internal Medicine

## 2023-01-06 DIAGNOSIS — N898 Other specified noninflammatory disorders of vagina: Secondary | ICD-10-CM | POA: Insufficient documentation

## 2023-01-06 NOTE — Assessment & Plan Note (Signed)
Symptoms as outlined.  Discussed decreased estrogen and changes.  KOH/Wet prep obtained.  Further treatment pending results.

## 2023-01-06 NOTE — Assessment & Plan Note (Signed)
No upper symptoms reported.  On protonix.   

## 2023-01-06 NOTE — Assessment & Plan Note (Signed)
Avoid antiinflammatories.  Stay hydrated.  Follow metabolic panel.   

## 2023-01-26 ENCOUNTER — Other Ambulatory Visit: Payer: Self-pay | Admitting: Internal Medicine

## 2023-02-13 ENCOUNTER — Other Ambulatory Visit: Payer: Medicare PPO

## 2023-02-16 ENCOUNTER — Encounter: Payer: Medicare PPO | Admitting: Internal Medicine

## 2023-02-21 ENCOUNTER — Other Ambulatory Visit (INDEPENDENT_AMBULATORY_CARE_PROVIDER_SITE_OTHER): Payer: Medicare PPO

## 2023-02-21 DIAGNOSIS — E7800 Pure hypercholesterolemia, unspecified: Secondary | ICD-10-CM

## 2023-02-21 LAB — HEPATIC FUNCTION PANEL
ALT: 16 U/L (ref 0–35)
AST: 20 U/L (ref 0–37)
Albumin: 3.9 g/dL (ref 3.5–5.2)
Alkaline Phosphatase: 46 U/L (ref 39–117)
Bilirubin, Direct: 0.1 mg/dL (ref 0.0–0.3)
Total Bilirubin: 0.6 mg/dL (ref 0.2–1.2)
Total Protein: 6.1 g/dL (ref 6.0–8.3)

## 2023-02-21 LAB — BASIC METABOLIC PANEL
BUN: 12 mg/dL (ref 6–23)
CO2: 29 mEq/L (ref 19–32)
Calcium: 9.7 mg/dL (ref 8.4–10.5)
Chloride: 106 mEq/L (ref 96–112)
Creatinine, Ser: 0.94 mg/dL (ref 0.40–1.20)
GFR: 58.64 mL/min — ABNORMAL LOW (ref >60.00)
Glucose, Bld: 78 mg/dL (ref 70–99)
Potassium: 4.3 mEq/L (ref 3.5–5.1)
Sodium: 142 mEq/L (ref 135–145)

## 2023-02-21 LAB — LIPID PANEL
Cholesterol: 144 mg/dL (ref 0–200)
HDL: 63.4 mg/dL (ref >39.00)
LDL Cholesterol: 60 mg/dL (ref 0–99)
NonHDL: 80.27
Total CHOL/HDL Ratio: 2
Triglycerides: 103 mg/dL (ref 0.0–149.0)
VLDL: 20.6 mg/dL (ref 0.0–40.0)

## 2023-02-27 ENCOUNTER — Ambulatory Visit (INDEPENDENT_AMBULATORY_CARE_PROVIDER_SITE_OTHER): Payer: Medicare PPO | Admitting: Internal Medicine

## 2023-02-27 ENCOUNTER — Encounter: Payer: Self-pay | Admitting: Internal Medicine

## 2023-02-27 VITALS — BP 118/72 | HR 75 | Temp 98.2°F | Ht 63.0 in | Wt 123.6 lb

## 2023-02-27 DIAGNOSIS — N898 Other specified noninflammatory disorders of vagina: Secondary | ICD-10-CM

## 2023-02-27 DIAGNOSIS — D179 Benign lipomatous neoplasm, unspecified: Secondary | ICD-10-CM | POA: Diagnosis not present

## 2023-02-27 DIAGNOSIS — N1831 Chronic kidney disease, stage 3a: Secondary | ICD-10-CM

## 2023-02-27 DIAGNOSIS — Z8673 Personal history of transient ischemic attack (TIA), and cerebral infarction without residual deficits: Secondary | ICD-10-CM | POA: Diagnosis not present

## 2023-02-27 DIAGNOSIS — E7800 Pure hypercholesterolemia, unspecified: Secondary | ICD-10-CM

## 2023-02-27 DIAGNOSIS — K219 Gastro-esophageal reflux disease without esophagitis: Secondary | ICD-10-CM | POA: Diagnosis not present

## 2023-02-27 DIAGNOSIS — M79606 Pain in leg, unspecified: Secondary | ICD-10-CM | POA: Diagnosis not present

## 2023-02-27 DIAGNOSIS — Z0000 Encounter for general adult medical examination without abnormal findings: Secondary | ICD-10-CM

## 2023-02-27 NOTE — Progress Notes (Signed)
Subjective:    Patient ID: Joan Ritter, female    DOB: 1946/03/23, 77 y.o.   MRN: [id number]  Patient here for  Chief Complaint  Patient presents with   Annual Exam    HPI Here for a physical. Reports increased pain- right > left buttock pain - extending into her upper thighs.  Previous sciatic pain - resolved.  Persistent pain as outlined.  Tries to stay active.  No chest pain or sob reported.  No cough or congestion.  No abdominal pain or bowel change reported.  Persistent vaginal lesion/irritation.     Past Medical History:  Diagnosis Date   Allergic rhinitis    seasonal   Elevated TSH    Esophagitis    GERD (gastroesophageal reflux disease)    History of Helicobacter pylori infection 2012   Hypercholesterolemia    Osteopenia    TIA (transient ischemic attack) 2001   Past Surgical History:  Procedure Laterality Date   BREAST BIOPSY Right 2000   neg   BREAST BIOPSY Right 05/18/2017   adipose tissue, coil marker   BREAST EXCISIONAL BIOPSY Right 1970   NEG   COLONOSCOPY  2005   normal    ESOPHAGOGASTRODUODENOSCOPY  2012   normal    HEMORRHOID SURGERY  1980s   Family History  Problem Relation Age of Onset   Lymphoma Father    CVA Mother        h/o cerebral hemorrhage   Brain cancer Brother    Cardiomyopathy Sister        enlarged heart, died age 70 - sudden death   Breast cancer Maternal Aunt        x2   Colon cancer Neg Hx    Social History   Socioeconomic History   Marital status: Married    Spouse name: Not on file   Number of children: 2   Years of education: Not on file   Highest education level: Not on file  Occupational History   Not on file  Tobacco Use   Smoking status: Never   Smokeless tobacco: Never  Vaping Use   Vaping Use: Never used  Substance and Sexual Activity   Alcohol use: No    Alcohol/week: 0.0 standard drinks of alcohol   Drug use: No   Sexual activity: Yes    Birth control/protection: Post-menopausal, Abstinence   Other Topics Concern   Not on file  Social History Narrative   Not on file   Social Determinants of Health   Financial Resource Strain: Low Risk  (05/01/2022)   Overall Financial Resource Strain (CARDIA)    Difficulty of Paying Living Expenses: Not hard at all  Food Insecurity: No Food Insecurity (05/01/2022)   Hunger Vital Sign    Worried About Running Out of Food in the Last Year: Never true    Ran Out of Food in the Last Year: Never true  Transportation Needs: No Transportation Needs (05/01/2022)   PRAPARE - Administrator, Civil Service (Medical): No    Lack of Transportation (Non-Medical): No  Physical Activity: Sufficiently Active (05/01/2022)   Exercise Vital Sign    Days of Exercise per Week: 7 days    Minutes of Exercise per Session: 40 min  Stress: No Stress Concern Present (05/01/2022)   [employer] of Occupational Health - Occupational Stress Questionnaire    Feeling of Stress : Not at all  Social Connections: Unknown (05/01/2022)   Social Connection and Isolation Panel [NHANES]  Frequency of Communication with Friends and Family: More than three times a week    Frequency of Social Gatherings with Friends and Family: More than three times a week    Attends Religious Services: Not on Marketing executive or Organizations: Not on file    Attends Banker Meetings: Not on file    Marital Status: Married     Review of Systems  Constitutional:  Negative for appetite change and unexpected weight change.  HENT:  Negative for congestion, sinus pressure and sore throat.   Eyes:  Negative for pain and visual disturbance.  Respiratory:  Negative for cough, chest tightness and shortness of breath.   Cardiovascular:  Negative for chest pain, palpitations and leg swelling.  Gastrointestinal:  Negative for abdominal pain, diarrhea, nausea and vomiting.  Genitourinary:  Negative for difficulty urinating and dysuria.       Persistent vaginal  lesion.   Musculoskeletal:  Negative for joint swelling and myalgias.  Skin:  Negative for color change and rash.  Neurological:  Negative for dizziness and headaches.  Hematological:  Negative for adenopathy. Does not bruise/bleed easily.  Psychiatric/Behavioral:  Negative for agitation and dysphoric mood.        Objective:     BP 118/72   Pulse 75   Temp 98.2 F (36.8 C) (Oral)   Ht 5\' 3"  (1.6 m)   Wt 123 lb 9.6 oz (56.1 kg)   SpO2 95%   BMI 21.89 kg/m  Wt Readings from Last 3 Encounters:  02/27/23 123 lb 9.6 oz (56.1 kg)  12/28/22 120 lb (54.4 kg)  08/14/22 125 lb (56.7 kg)    Physical Exam Vitals reviewed.  Constitutional:      General: She is not in acute distress.    Appearance: Normal appearance. She is well-developed.  HENT:     Head: Normocephalic and atraumatic.     Right Ear: External ear normal.     Left Ear: External ear normal.  Eyes:     General: No scleral icterus.       Right eye: No discharge.        Left eye: No discharge.     Conjunctiva/sclera: Conjunctivae normal.  Neck:     Thyroid: No thyromegaly.  Cardiovascular:     Rate and Rhythm: Normal rate and regular rhythm.  Pulmonary:     Effort: No tachypnea, accessory muscle usage or respiratory distress.     Breath sounds: Normal breath sounds. No decreased breath sounds or wheezing.  Chest:  Breasts:    Right: No inverted nipple, mass, nipple discharge or tenderness (no axillary adenopathy).     Left: No inverted nipple, mass, nipple discharge or tenderness (no axilarry adenopathy).  Abdominal:     General: Bowel sounds are normal.     Palpations: Abdomen is soft.     Tenderness: There is no abdominal tenderness.  Musculoskeletal:        General: No swelling or tenderness.     Cervical back: Neck supple.  Lymphadenopathy:     Cervical: No cervical adenopathy.  Skin:    Findings: No erythema or rash.  Neurological:     Mental Status: She is alert and oriented to person, place, and  time.  Psychiatric:        Mood and Affect: Mood normal.        Behavior: Behavior normal.      Outpatient Encounter Medications as of 02/27/2023  Medication Sig   Acetaminophen (  TYLENOL ARTHRITIS PAIN PO) Take by mouth 2 (two) times daily.   acyclovir (ZOVIRAX) 800 MG tablet TAKE 1 TABLET (800 MG TOTAL) BY MOUTH AS DIRECTED.   aspirin 81 MG tablet Take 81 mg by mouth daily.   Calcium Carbonate-Vit D-Min 600-400 MG-UNIT TABS Take 1 tablet by mouth 2 (two) times daily.   cetirizine (ZYRTEC) 10 MG tablet Take 10 mg by mouth daily as needed.    famotidine (PEPCID) 20 MG tablet Take 1 tablet (20 mg total) by mouth daily.   fluticasone (FLONASE) 50 MCG/ACT nasal spray USE 2 SPRAYS EACH NOSTRIL EVERY DAY   Multiple Vitamin (MULTIVITAMIN) tablet Take 1 tablet by mouth daily.   pantoprazole (PROTONIX) 40 MG tablet Take 1 tablet (40 mg total) by mouth daily.   Probiotic Product (PROBIOTIC-10 PO) Take by mouth.   rosuvastatin (CRESTOR) 20 MG tablet Take 1 tablet (20 mg total) by mouth daily.   [DISCONTINUED] clopidogrel (PLAVIX) 75 MG tablet Take 1 tablet (75 mg total) by mouth daily.   No facility-administered encounter medications on file as of 02/27/2023.     Lab Results  Component Value Date   WBC 6.8 08/09/2022   HGB 13.4 08/09/2022   HCT 39.5 08/09/2022   PLT 181.0 08/09/2022   GLUCOSE 78 02/21/2023   CHOL 144 02/21/2023   TRIG 103.0 02/21/2023   HDL 63.40 02/21/2023   LDLCALC 60 02/21/2023   ALT 16 02/21/2023   AST 20 02/21/2023   NA 142 02/21/2023   K 4.3 02/21/2023   CL 106 02/21/2023   CREATININE 0.94 02/21/2023   BUN 12 02/21/2023   CO2 29 02/21/2023   TSH 4.34 08/09/2022    No results found.     Assessment & Plan:  Routine general medical examination at a health care facility  Health care maintenance Assessment & Plan: Physical today 02/27/23.  Mammogram 06/16/22 - Birads II.  Colonoscopy 07/2014 - normal.  Recommended f/u in 10 years.     Pain of lower  extremity, unspecified laterality Assessment & Plan: Describes buttock and upper thigh pain - bilateral.  Has been persistent. Discussed spinal stenosis.  Has history - previous MRI 2018.  Refer to ortho for further evaluation.   Orders: -     Ambulatory referral to Orthopedic Surgery  Vaginal irritation Assessment & Plan: Persistent vaginal irritation and "lesion".  Previous wet prep negative. Discussed decreased estrogen, etc. Will have gyn evaluate.    Orders: -     Ambulatory referral to Gynecology  Angiomyolipoma Assessment & Plan: Worked up by Dr Doylene Canning.  Felt no further w/up warranted.      Stage 3a chronic kidney disease ([facility]) Assessment & Plan: Avoid antiinflammatories.  Stay hydrated.  Follow metabolic panel.   Orders: -     Basic metabolic panel; Future  Gastroesophageal reflux disease without esophagitis Assessment & Plan: No upper symptoms reported.  On protonix.    History of TIA (transient ischemic attack) Assessment & Plan: Continue plavix and crestor.  Follow.    Hypercholesterolemia Assessment & Plan: On crestor 20mg  q day.  Continue low cholesterol diet and exercise.  Follow lipid panel and liver function tests.   Orders: -     CBC with Differential/Platelet; Future -     Hepatic function panel; Future -     TSH; Future -     Lipid panel; Future     Dale Gurdon, MD

## 2023-02-27 NOTE — Assessment & Plan Note (Signed)
Physical today 02/27/23.  Mammogram 06/16/22 - Birads II.  Colonoscopy 07/2014 - normal.  Recommended f/u in 10 years.

## 2023-03-08 ENCOUNTER — Encounter: Payer: Self-pay | Admitting: Internal Medicine

## 2023-03-09 NOTE — Assessment & Plan Note (Signed)
Describes buttock and upper thigh pain - bilateral.  Has been persistent. Discussed spinal stenosis.  Has history - previous MRI 2018.  Refer to ortho for further evaluation.

## 2023-03-09 NOTE — Telephone Encounter (Signed)
Orders have been placed. Someone should be contacting her with an appt date and time.

## 2023-03-09 NOTE — Assessment & Plan Note (Addendum)
Persistent vaginal irritation and "lesion".  Previous wet prep negative. Discussed decreased estrogen, etc. Will have gyn evaluate.

## 2023-03-15 ENCOUNTER — Other Ambulatory Visit: Payer: Self-pay | Admitting: Internal Medicine

## 2023-03-17 ENCOUNTER — Other Ambulatory Visit: Payer: Self-pay | Admitting: Internal Medicine

## 2023-03-18 ENCOUNTER — Encounter: Payer: Self-pay | Admitting: Internal Medicine

## 2023-03-18 NOTE — Assessment & Plan Note (Signed)
No upper symptoms reported.  On protonix.   

## 2023-03-18 NOTE — Assessment & Plan Note (Signed)
Avoid antiinflammatories.  Stay hydrated.  Follow metabolic panel.   

## 2023-03-18 NOTE — Assessment & Plan Note (Signed)
Worked up by Dr Choksi.  Felt no further w/up warranted.    

## 2023-03-18 NOTE — Assessment & Plan Note (Signed)
On crestor 20mg q day.  Continue low cholesterol diet and exercise.  Follow lipid panel and liver function tests.  

## 2023-03-18 NOTE — Assessment & Plan Note (Signed)
Continue plavix and crestor.  Follow.  

## 2023-03-22 DIAGNOSIS — N958 Other specified menopausal and perimenopausal disorders: Secondary | ICD-10-CM | POA: Diagnosis not present

## 2023-03-22 DIAGNOSIS — N952 Postmenopausal atrophic vaginitis: Secondary | ICD-10-CM | POA: Diagnosis not present

## 2023-03-22 DIAGNOSIS — N362 Urethral caruncle: Secondary | ICD-10-CM | POA: Diagnosis not present

## 2023-03-26 DIAGNOSIS — M79605 Pain in left leg: Secondary | ICD-10-CM | POA: Diagnosis not present

## 2023-03-26 DIAGNOSIS — M79604 Pain in right leg: Secondary | ICD-10-CM | POA: Diagnosis not present

## 2023-04-08 ENCOUNTER — Emergency Department
Admission: EM | Admit: 2023-04-08 | Discharge: 2023-04-08 | Disposition: A | Discharge status: Home / Self Care | Payer: Medicare PPO | Attending: Emergency Medicine | Admitting: Emergency Medicine

## 2023-04-08 ENCOUNTER — Other Ambulatory Visit: Payer: Self-pay

## 2023-04-08 ENCOUNTER — Emergency Department: Payer: Medicare PPO

## 2023-04-08 DIAGNOSIS — N281 Cyst of kidney, acquired: Secondary | ICD-10-CM | POA: Diagnosis not present

## 2023-04-08 DIAGNOSIS — E860 Dehydration: Secondary | ICD-10-CM | POA: Diagnosis not present

## 2023-04-08 DIAGNOSIS — R197 Diarrhea, unspecified: Secondary | ICD-10-CM | POA: Diagnosis not present

## 2023-04-08 DIAGNOSIS — Z8673 Personal history of transient ischemic attack (TIA), and cerebral infarction without residual deficits: Secondary | ICD-10-CM | POA: Insufficient documentation

## 2023-04-08 DIAGNOSIS — R109 Unspecified abdominal pain: Secondary | ICD-10-CM | POA: Diagnosis not present

## 2023-04-08 DIAGNOSIS — K7689 Other specified diseases of liver: Secondary | ICD-10-CM | POA: Diagnosis not present

## 2023-04-08 DIAGNOSIS — R112 Nausea with vomiting, unspecified: Secondary | ICD-10-CM | POA: Diagnosis not present

## 2023-04-08 DIAGNOSIS — R55 Syncope and collapse: Secondary | ICD-10-CM | POA: Diagnosis not present

## 2023-04-08 DIAGNOSIS — N183 Chronic kidney disease, stage 3 unspecified: Secondary | ICD-10-CM | POA: Diagnosis not present

## 2023-04-08 DIAGNOSIS — I959 Hypotension, unspecified: Secondary | ICD-10-CM | POA: Diagnosis not present

## 2023-04-08 DIAGNOSIS — R4182 Altered mental status, unspecified: Secondary | ICD-10-CM | POA: Diagnosis not present

## 2023-04-08 LAB — CBC WITH DIFFERENTIAL/PLATELET
Abs Immature Granulocytes: 0.12 10*3/uL — ABNORMAL HIGH (ref 0.00–0.07)
Basophils Absolute: 0.1 10*3/uL (ref 0.0–0.1)
Basophils Relative: 0 %
Eosinophils Absolute: 0.2 10*3/uL (ref 0.0–0.5)
Eosinophils Relative: 1 %
HCT: 44.7 % (ref 36.0–46.0)
Hemoglobin: 14.9 g/dL (ref 12.0–15.0)
Immature Granulocytes: 1 %
Lymphocytes Relative: 10 %
Lymphs Abs: 2.3 10*3/uL (ref 0.7–4.0)
MCH: 32 pg (ref 26.0–34.0)
MCHC: 33.3 g/dL (ref 30.0–36.0)
MCV: 96.1 fL (ref 80.0–100.0)
Monocytes Absolute: 1.4 10*3/uL — ABNORMAL HIGH (ref 0.1–1.0)
Monocytes Relative: 6 %
Neutro Abs: 19.1 10*3/uL — ABNORMAL HIGH (ref 1.7–7.7)
Neutrophils Relative %: 82 %
Platelets: 203 10*3/uL (ref 150–400)
RBC: 4.65 MIL/uL (ref 3.87–5.11)
RDW: 11.9 % (ref 11.5–15.5)
WBC: 23.2 10*3/uL — ABNORMAL HIGH (ref 4.0–10.5)
nRBC: 0 % (ref 0.0–0.2)

## 2023-04-08 LAB — COMPREHENSIVE METABOLIC PANEL
ALT: 21 U/L (ref 0–44)
AST: 21 U/L (ref 15–41)
Albumin: 3.9 g/dL (ref 3.5–5.0)
Alkaline Phosphatase: 48 U/L (ref 38–126)
Anion gap: 8 (ref 5–15)
BUN: 14 mg/dL (ref 8–23)
CO2: 28 mmol/L (ref 22–32)
Calcium: 8.6 mg/dL — ABNORMAL LOW (ref 8.9–10.3)
Chloride: 102 mmol/L (ref 98–111)
Creatinine, Ser: 0.9 mg/dL (ref 0.44–1.00)
GFR, Estimated: >60 mL/min (ref >60)
Glucose, Bld: 140 mg/dL — ABNORMAL HIGH (ref 70–99)
Potassium: 3.4 mmol/L — ABNORMAL LOW (ref 3.5–5.1)
Sodium: 138 mmol/L (ref 135–145)
Total Bilirubin: 0.8 mg/dL (ref 0.3–1.2)
Total Protein: 6.4 g/dL — ABNORMAL LOW (ref 6.5–8.1)

## 2023-04-08 LAB — LIPASE, BLOOD: Lipase: 42 U/L (ref 11–51)

## 2023-04-08 MED ORDER — ONDANSETRON 8 MG PO TBDP: 8.0000 mg | ORAL_TABLET | Freq: Three times a day (TID) | ORAL | 0 refills | Status: DC | PRN

## 2023-04-08 MED ORDER — SODIUM CHLORIDE 0.9 % IV BOLUS
1000.0000 mL | Freq: Once | INTRAVENOUS | Status: AC
  Administered 2023-04-08: 1000 mL via INTRAVENOUS

## 2023-04-08 MED ORDER — IOHEXOL 300 MG/ML  SOLN
100.0000 mL | Freq: Once | INTRAMUSCULAR | Status: AC | PRN
  Administered 2023-04-08: 100 mL via INTRAVENOUS

## 2023-04-08 MED ORDER — ONDANSETRON HCL 4 MG/2ML IJ SOLN
4.0000 mg | Freq: Once | INTRAMUSCULAR | Status: AC
  Administered 2023-04-08: 4 mg via INTRAVENOUS
  Filled 2023-04-08: qty 2

## 2023-04-08 NOTE — ED Provider Notes (Signed)
[facility] Provider Note   Event Date/Time   First MD Initiated Contact with Patient 04/08/23 [phone]     (approximate) History  Nausea  HPI Joan Ritter is a 77 y.o. female with a past medical history of TIA, GERD, hypercholesterolemia, and CKD stage III who presents complaining of nausea/vomiting/diarrhea as well as abdominal pain that has been present for the last 1.5 hours.  Husband is at bedside as well and states that patient had a loss of consciousness after diarrhea and threw up without knowing it.  Patient returned to baseline within 30 seconds and has not had any subsequent episodes of loss of consciousness.  Patient states that her abdominal pain has somewhat improved after vomiting and diarrhea.  Patient did not receive any medications en route via EMS. ROS: Patient currently denies any vision changes, tinnitus, difficulty speaking, facial droop, sore throat, chest pain, shortness of breath, dysuria, or weakness/numbness/paresthesias in any extremity   Physical Exam  Triage Vital Signs: ED Triage Vitals [04/08/23 0443]  Enc Vitals Group     BP      Pulse      Resp      Temp      Temp src      SpO2      Weight      Height      Head Circumference      Peak Flow      Pain Score 5     Pain Loc      Pain Edu?      Excl. in GC?    Most recent vital signs: Vitals:   04/08/23 0600 04/08/23 0615  BP: 129/61   Pulse: (!) 101 91  Resp: 20 18  Temp:    SpO2: 97% 95%   General: Awake, oriented x4. CV:  Good peripheral perfusion.  Resp:  Normal effort.  Abd:  No distention.  Other:  Elderly Caucasian female laying in bed in no acute distress. ED Results / Procedures / Treatments  Labs (all labs ordered are listed, but only abnormal results are displayed) Labs Reviewed  COMPREHENSIVE METABOLIC PANEL - Abnormal; Notable for the following components:      Result Value   Potassium 3.4 (*)    Glucose, Bld 140 (*)    Calcium 8.6 (*)    Total  Protein 6.4 (*)    All other components within normal limits  CBC WITH DIFFERENTIAL/PLATELET - Abnormal; Notable for the following components:   WBC 23.2 (*)    Neutro Abs 19.1 (*)    Monocytes Absolute 1.4 (*)    Abs Immature Granulocytes 0.12 (*)    All other components within normal limits  LIPASE, BLOOD   RADIOLOGY ED MD interpretation: CT of the abdomen and pelvis with IV contrast interpreted independently by me and shows right lower lobe bronchopneumonia with no pleural effusion, suggestion of mild generalized mesenteric congestion or inflammation throughout as well as decompressed bowel loops without evidence of bowel obstruction or discrete bowel inflammation but generalized enteritis is not excluded.  There is also an incidentally found renal mass  CT of the head without contrast interpreted by me shows no evidence of acute abnormalities including no intracerebral hemorrhage, obvious masses, or significant edema -Agree with radiology assessment Official radiology report(s): CT ABDOMEN PELVIS W CONTRAST  Result Date: 04/08/2023 CLINICAL DATA:  77 year old female with altered mental status. Abdominal pain, nausea vomiting diarrhea. EXAM: CT ABDOMEN AND PELVIS WITH CONTRAST TECHNIQUE: Multidetector CT imaging of  the abdomen and pelvis was performed using the standard protocol following bolus administration of intravenous contrast. RADIATION DOSE REDUCTION: This exam was performed according to the departmental dose-optimization program which includes automated exposure control, adjustment of the mA and/or kV according to patient size and/or use of iterative reconstruction technique. CONTRAST:  OMNIPAQUE IOHEXOL 300 MG/ML  SOLN COMPARISON:  CT Abdomen and Pelvis 12/13/2006. FINDINGS: Lower chest: No cardiomegaly or pericardial effusion. Medial basal segment right lower lobe solid and sub solid peribronchial opacity appears infectious/inflammatory. Mild posterior basal segment involvement.  Superimposed additional lung base atelectasis affecting both the middle and lower lobes. A round 9 mm lateral basal segment left lower lobe pulmonary nodule is stable since 2008, therefore benign. No pleural effusion. Hepatobiliary: Chronic hepatic cysts with simple fluid density, the largest now in the right hepatic dome on series 2, image 18 was present in 2008 (no follow-up imaging recommended). Background liver enhancement within normal limits. Gallbladder within normal limits. Pancreas: Negative. Spleen: Negative. Adrenals/Urinary Tract: Normal adrenal glands. Nonobstructed kidneys. Symmetric renal contrast excretion. Ureters and urinary bladder within normal limits. Numerous pelvic phleboliths but no convincing urinary calculus. Complex mixed density and enhancing 2.3 cm lesion of the left renal lower pole (series 5, image 35) was present in 2008 but is slightly larger and more complex in appearance now (up to 2.3 cm diameter previously 1.8 cm). Otherwise small benign renal cysts are identified which no follow-up is recommended for. Stomach/Bowel: Largely decompressed colon and small bowel throughout the abdomen and pelvis. Fluid in the right colon. Sigmoid colon diverticulosis. No discrete bowel inflammation, but questionable mild generalized mesenteric inflammation or congestion (coronal image 21). Gas in the stomach. Duodenum is decompressed. No free air. No free fluid in the abdomen. Normal appendix on series 2, image 66. Vascular/Lymphatic: Major arterial structures appear patent. Minimal atherosclerosis in the abdomen and pelvis. Portal venous system is patent. No lymphadenopathy. Reproductive: Within normal limits. Other: Trace free fluid in the right hemipelvis is nonspecific on series 2, image 74. Simple fluid density. Musculoskeletal: Lumbar spine degeneration including L4-L5 spondylolisthesis and facet arthropathy. No acute or suspicious osseous lesion identified, although a benign bone island of  the left sacral ala is new since 2008 (1,500 Hounsfield units series 2, image 66. IMPRESSION: 1. Right lower lobe Bronchopneumonia.  No pleural effusion. 2. Suggestion of mild generalized mesenteric congestion or inflammation throughout. Decompressed bowel loops, without evidence of bowel obstruction or discrete bowel inflammation, but Generalized Enteritis is not excluded. 3. A chronic left lower pole renal mass versus complicated cyst has increased by less than 1 cm in size since 2008. Recommend follow-up with Urology. Electronically Signed   By: Odessa Fleming M.D.   On: 04/08/2023 06:38   CT Head Wo Contrast  Result Date: 04/08/2023 CLINICAL DATA:  77 year old female with altered mental status. Abdominal pain, nausea vomiting diarrhea. EXAM: CT HEAD WITHOUT CONTRAST TECHNIQUE: Contiguous axial images were obtained from the base of the skull through the vertex without intravenous contrast. RADIATION DOSE REDUCTION: This exam was performed according to the departmental dose-optimization program which includes automated exposure control, adjustment of the mA and/or kV according to patient size and/or use of iterative reconstruction technique. COMPARISON:  Head CT 01/10/2019. FINDINGS: Brain: Cerebral volume is within normal limits for age. No midline shift, ventriculomegaly, mass effect, evidence of mass lesion, intracranial hemorrhage or evidence of cortically based acute infarction. Gray-white matter differentiation is within normal limits throughout the brain. Vascular: Calcified atherosclerosis at the skull base. No suspicious  intracranial vascular hyperdensity. Skull: No acute osseous abnormality identified. Sinuses/Orbits: Scattered bilateral mild to moderate ethmoid sinus mucosal thickening, and new left maxillary sinus fluid level since the 2020 comparison. Other paranasal sinuses, tympanic cavities and mastoids remain well aerated. Other: Orbit and scalp soft tissues within normal limits now, postoperative  changes to both globes. IMPRESSION: 1. Normal for age non contrast CT appearance of the brain. 2. New bilateral ethmoid and left maxillary sinus inflammation suggestive of acute sinusitis. Electronically Signed   By: Odessa Fleming M.D.   On: 04/08/2023 06:28   PROCEDURES: Critical Care performed: No .1-3 Lead EKG Interpretation  Performed by: Merwyn Katos, MD Authorized by: Merwyn Katos, MD     Interpretation: normal     ECG rate:  71   ECG rate assessment: normal     Rhythm: sinus rhythm     Ectopy: none     Conduction: normal    MEDICATIONS ORDERED IN ED: Medications  ondansetron (ZOFRAN) injection 4 mg (4 mg Intravenous Given 04/08/23 0453)  sodium chloride 0.9 % bolus 1,000 mL (0 mLs Intravenous Stopped 04/08/23 0559)  iohexol (OMNIPAQUE) 300 MG/ML solution 100 mL (100 mLs Intravenous Contrast Given 04/08/23 0548)   IMPRESSION / MDM / ASSESSMENT AND PLAN / ED COURSE  I reviewed the triage vital signs and the nursing notes.                             The patient is on the cardiac monitor to evaluate for evidence of arrhythmia and/or significant heart rate changes. Patient's presentation is most consistent with acute presentation with potential threat to life or bodily function. Patient presents for acute nausea/vomiting The cause of the patients symptoms is not clear, but the patient is overall well appearing and is suspected to have a transient course of illness.  Given History and Exam there does not appear to be an emergent cause of the symptoms such as small bowel obstruction, coronary syndrome, bowel ischemia, DKA, pancreatitis, appendicitis, other acute abdomen or other emergent problem.  Reassessment: After treatment, the patient is feeling much better, tolerating PO fluids, and shows no signs of dehydration.   Disposition: Discharge home with prompt primary care physician follow up in the next 48 hours. Strict return precautions discussed.   FINAL CLINICAL IMPRESSION(S) /  ED DIAGNOSES   Final diagnoses:  None   Rx / DC Orders   ED Discharge Orders     None      Note:  This document was prepared using Dragon voice recognition software and may include unintentional dictation errors.   Merwyn Katos, MD 04/08/23 [phone]

## 2023-04-08 NOTE — ED Triage Notes (Signed)
Pt BIB EMS for n/v/d that's been about 1.5 hours PTA. No tx PTA

## 2023-04-08 NOTE — Discharge Instructions (Addendum)
Please use over-the-counter Imodium as needed for significant dehydration or orthostatic lightheadedness (lightheadedness when you get up from a seated position)

## 2023-04-11 ENCOUNTER — Encounter: Payer: Self-pay | Admitting: Internal Medicine

## 2023-04-11 DIAGNOSIS — E876 Hypokalemia: Secondary | ICD-10-CM

## 2023-04-11 DIAGNOSIS — M79605 Pain in left leg: Secondary | ICD-10-CM | POA: Diagnosis not present

## 2023-04-11 DIAGNOSIS — M6281 Muscle weakness (generalized): Secondary | ICD-10-CM | POA: Diagnosis not present

## 2023-04-11 DIAGNOSIS — D72829 Elevated white blood cell count, unspecified: Secondary | ICD-10-CM

## 2023-04-11 DIAGNOSIS — M79604 Pain in right leg: Secondary | ICD-10-CM | POA: Diagnosis not present

## 2023-04-11 DIAGNOSIS — M5416 Radiculopathy, lumbar region: Secondary | ICD-10-CM | POA: Diagnosis not present

## 2023-04-12 NOTE — Telephone Encounter (Signed)
CONFIRMED NO ACUTE ISSUES. PATIENT COMING FOR LABS TOMORROW.

## 2023-04-12 NOTE — Telephone Encounter (Signed)
Called patient to confirm doing ok. No acute symptoms. Patient says she has felt much better the last 2 days. Her WBC in ED was 23.2. When do you want to repeat? Patient is leaving Sunday to go out of town for 2 weeks.

## 2023-04-12 NOTE — Telephone Encounter (Signed)
Please let her know that I have been out of the office. Per review of ER records, she was sick and apparently passed out (note states husband reported loss of consciousness).  Please confirm if she is eating.  Any symptoms now?  Dizziness, light headedness, etc?  If any problems - needs to be evaluated.  I am ok with recheck labs - cbc and met b, but needs evaluation if any persistent issues.

## 2023-04-13 ENCOUNTER — Other Ambulatory Visit (INDEPENDENT_AMBULATORY_CARE_PROVIDER_SITE_OTHER): Payer: Medicare PPO

## 2023-04-13 ENCOUNTER — Telehealth: Payer: Self-pay

## 2023-04-13 DIAGNOSIS — D72829 Elevated white blood cell count, unspecified: Secondary | ICD-10-CM

## 2023-04-13 DIAGNOSIS — E876 Hypokalemia: Secondary | ICD-10-CM | POA: Diagnosis not present

## 2023-04-13 LAB — BASIC METABOLIC PANEL
BUN: 7 mg/dL (ref 6–23)
CO2: 30 mEq/L (ref 19–32)
Calcium: 9 mg/dL (ref 8.4–10.5)
Chloride: 102 mEq/L (ref 96–112)
Creatinine, Ser: 0.92 mg/dL (ref 0.40–1.20)
GFR: 60.12 mL/min (ref >60.00)
Glucose, Bld: 90 mg/dL (ref 70–99)
Potassium: 3.9 mEq/L (ref 3.5–5.1)
Sodium: 140 mEq/L (ref 135–145)

## 2023-04-13 LAB — CBC WITH DIFFERENTIAL/PLATELET
Basophils Absolute: 0 10*3/uL (ref 0.0–0.1)
Basophils Relative: 0.7 % (ref 0.0–3.0)
Eosinophils Absolute: 0.2 10*3/uL (ref 0.0–0.7)
Eosinophils Relative: 2.4 % (ref 0.0–5.0)
HCT: 41.2 % (ref 36.0–46.0)
Hemoglobin: 13.8 g/dL (ref 12.0–15.0)
Lymphocytes Relative: 29.9 % (ref 12.0–46.0)
Lymphs Abs: 1.9 10*3/uL (ref 0.7–4.0)
MCHC: 33.4 g/dL (ref 30.0–36.0)
MCV: 95.9 fl (ref 78.0–100.0)
Monocytes Absolute: 0.6 10*3/uL (ref 0.1–1.0)
Monocytes Relative: 9.2 % (ref 3.0–12.0)
Neutro Abs: 3.7 10*3/uL (ref 1.4–7.7)
Neutrophils Relative %: 57.8 % (ref 43.0–77.0)
Platelets: 220 10*3/uL (ref 150.0–400.0)
RBC: 4.3 Mil/uL (ref 3.87–5.11)
RDW: 12.8 % (ref 11.5–15.5)
WBC: 6.4 10*3/uL (ref 4.0–10.5)

## 2023-04-13 NOTE — Telephone Encounter (Signed)
Transition Care Management Follow-up Telephone Call Date of discharge and from where:  6/16 How have you been since you were released from the hospital? Doing ok Any questions or concerns? No  Items Reviewed: Did the pt receive and understand the discharge instructions provided? Yes  Medications obtained and verified? Yes  Other? No  Any new allergies since your discharge? No  Dietary orders reviewed? No Do you have support at home? Yes     Follow up appointments reviewed:  PCP Hospital f/u appt confirmed? Yes  Scheduled to see PCP on 6/21 @ . Specialist Hospital f/u appt confirmed? No  Scheduled to see  on  @ . Are transportation arrangements needed? No  If their condition worsens, is the pt aware to call PCP or go to the Emergency Dept.? Yes Was the patient provided with contact information for the PCP's office or ED? Yes Was to pt encouraged to call back with questions or concerns? Yes

## 2023-05-02 DIAGNOSIS — M5416 Radiculopathy, lumbar region: Secondary | ICD-10-CM | POA: Diagnosis not present

## 2023-05-02 DIAGNOSIS — M6281 Muscle weakness (generalized): Secondary | ICD-10-CM | POA: Diagnosis not present

## 2023-05-02 DIAGNOSIS — M79605 Pain in left leg: Secondary | ICD-10-CM | POA: Diagnosis not present

## 2023-05-02 DIAGNOSIS — M79604 Pain in right leg: Secondary | ICD-10-CM | POA: Diagnosis not present

## 2023-05-07 ENCOUNTER — Ambulatory Visit: Payer: Medicare PPO | Admitting: *Deleted

## 2023-05-07 VITALS — Ht 63.0 in | Wt 121.0 lb

## 2023-05-07 DIAGNOSIS — Z0000 Encounter for general adult medical examination without abnormal findings: Secondary | ICD-10-CM

## 2023-05-07 NOTE — Patient Instructions (Signed)
Joan Ritter , Thank you for taking time to come for your Medicare Wellness Visit. I appreciate your ongoing commitment to your health goals. Please review the following plan we discussed and let me know if I can assist you in the future.   These are the goals we discussed:  Goals       Maintain Healthy Lifestyle (pt-stated)      Stay active. Healthy diet.       Patient Stated      None        This is a list of the screening recommended for you and due dates:  Health Maintenance  Topic Date Due   Hepatitis C Screening  Never done   DTaP/Tdap/Td vaccine (1 - Tdap) Never done   COVID-19 Vaccine (4 - 2023-24 season) 08/15/2023*   Flu Shot  05/24/2023   Mammogram  06/17/2023   Medicare Annual Wellness Visit  05/06/2024   Pneumonia Vaccine  Completed   DEXA scan (bone density measurement)  Completed   Zoster (Shingles) Vaccine  Completed   HPV Vaccine  Aged Out   Colon Cancer Screening  Discontinued  *Topic was postponed. The date shown is not the original due date.    Advanced directives: will pick up paperwork at next visit  Conditions/risks identified: None  Next appointment: Follow up in one year for your annual wellness visit Patient declined. Traveling out of town and wants to call back and schedule after returning home.   Preventive Care 21 Years and Older, Female Preventive care refers to lifestyle choices and visits with your health care provider that can promote health and wellness. What does preventive care include? A yearly physical exam. This is also called an annual well check. Dental exams once or twice a year. Routine eye exams. Ask your health care provider how often you should have your eyes checked. Personal lifestyle choices, including: Daily care of your teeth and gums. Regular physical activity. Eating a healthy diet. Avoiding tobacco and drug use. Limiting alcohol use. Practicing safe sex. Taking low-dose aspirin every day. Taking vitamin and  mineral supplements as recommended by your health care provider. What happens during an annual well check? The services and screenings done by your health care provider during your annual well check will depend on your age, overall health, lifestyle risk factors, and family history of disease. Counseling  Your health care provider may ask you questions about your: Alcohol use. Tobacco use. Drug use. Emotional well-being. Home and relationship well-being. Sexual activity. Eating habits. History of falls. Memory and ability to understand (cognition). Work and work Astronomer. Reproductive health. Screening  You may have the following tests or measurements: Height, weight, and BMI. Blood pressure. Lipid and cholesterol levels. These may be checked every 5 years, or more frequently if you are over 87 years old. Skin check. Lung cancer screening. You may have this screening every year starting at age 45 if you have a 30-pack-year history of smoking and currently smoke or have quit within the past 15 years. Fecal occult blood test (FOBT) of the stool. You may have this test every year starting at age 88. Flexible sigmoidoscopy or colonoscopy. You may have a sigmoidoscopy every 5 years or a colonoscopy every 10 years starting at age 49. Hepatitis C blood test. Hepatitis B blood test. Sexually transmitted disease (STD) testing. Diabetes screening. This is done by checking your blood sugar (glucose) after you have not eaten for a while (fasting). You may have this done every 1-3  years. Bone density scan. This is done to screen for osteoporosis. You may have this done starting at age 57. Mammogram. This may be done every 1-2 years. Talk to your health care provider about how often you should have regular mammograms. Talk with your health care provider about your test results, treatment options, and if necessary, the need for more tests. Vaccines  Your health care provider may recommend  certain vaccines, such as: Influenza vaccine. This is recommended every year. Tetanus, diphtheria, and acellular pertussis (Tdap, Td) vaccine. You may need a Td booster every 10 years. Zoster vaccine. You may need this after age 53. Pneumococcal 13-valent conjugate (PCV13) vaccine. One dose is recommended after age 80. Pneumococcal polysaccharide (PPSV23) vaccine. One dose is recommended after age 65. Talk to your health care provider about which screenings and vaccines you need and how often you need them. This information is not intended to replace advice given to you by your health care provider. Make sure you discuss any questions you have with your health care provider. Document Released: 11/05/2015 Document Revised: 06/28/2016 Document Reviewed: 08/10/2015 Elsevier Interactive Patient Education  2017 [employer].  Fall Prevention in the Home Falls can cause injuries. They can happen to people of all ages. There are many things you can do to make your home safe and to help prevent falls. What can I do on the outside of my home? Regularly fix the edges of walkways and driveways and fix any cracks. Remove anything that might make you trip as you walk through a door, such as a raised step or threshold. Trim any bushes or trees on the path to your home. Use bright outdoor lighting. Clear any walking paths of anything that might make someone trip, such as rocks or tools. Regularly check to see if handrails are loose or broken. Make sure that both sides of any steps have handrails. Any raised decks and porches should have guardrails on the edges. Have any leaves, snow, or ice cleared regularly. Use sand or salt on walking paths during winter. Clean up any spills in your garage right away. This includes oil or grease spills. What can I do in the bathroom? Use night lights. Install grab bars by the toilet and in the tub and shower. Do not use towel bars as grab bars. Use non-skid mats or  decals in the tub or shower. If you need to sit down in the shower, use a plastic, non-slip stool. Keep the floor dry. Clean up any water that spills on the floor as soon as it happens. Remove soap buildup in the tub or shower regularly. Attach bath mats securely with double-sided non-slip rug tape. Do not have throw rugs and other things on the floor that can make you trip. What can I do in the bedroom? Use night lights. Make sure that you have a light by your bed that is easy to reach. Do not use any sheets or blankets that are too big for your bed. They should not hang down onto the floor. Have a firm chair that has side arms. You can use this for support while you get dressed. Do not have throw rugs and other things on the floor that can make you trip. What can I do in the kitchen? Clean up any spills right away. Avoid walking on wet floors. Keep items that you use a lot in easy-to-reach places. If you need to reach something above you, use a strong step stool that has a grab  bar. Keep electrical cords out of the way. Do not use floor polish or wax that makes floors slippery. If you must use wax, use non-skid floor wax. Do not have throw rugs and other things on the floor that can make you trip. What can I do with my stairs? Do not leave any items on the stairs. Make sure that there are handrails on both sides of the stairs and use them. Fix handrails that are broken or loose. Make sure that handrails are as long as the stairways. Check any carpeting to make sure that it is firmly attached to the stairs. Fix any carpet that is loose or worn. Avoid having throw rugs at the top or bottom of the stairs. If you do have throw rugs, attach them to the floor with carpet tape. Make sure that you have a light switch at the top of the stairs and the bottom of the stairs. If you do not have them, ask someone to add them for you. What else can I do to help prevent falls? Wear shoes that: Do not  have high heels. Have rubber bottoms. Are comfortable and fit you well. Are closed at the toe. Do not wear sandals. If you use a stepladder: Make sure that it is fully opened. Do not climb a closed stepladder. Make sure that both sides of the stepladder are locked into place. Ask someone to hold it for you, if possible. Clearly mark and make sure that you can see: Any grab bars or handrails. First and last steps. Where the edge of each step is. Use tools that help you move around (mobility aids) if they are needed. These include: Canes. Walkers. Scooters. Crutches. Turn on the lights when you go into a dark area. Replace any light bulbs as soon as they burn out. Set up your furniture so you have a clear path. Avoid moving your furniture around. If any of your floors are uneven, fix them. If there are any pets around you, be aware of where they are. Review your medicines with your doctor. Some medicines can make you feel dizzy. This can increase your chance of falling. Ask your doctor what other things that you can do to help prevent falls. This information is not intended to replace advice given to you by your health care provider. Make sure you discuss any questions you have with your health care provider. Document Released: 08/05/2009 Document Revised: 03/16/2016 Document Reviewed: 11/13/2014 Elsevier Interactive Patient Education  2017 [employer].  Per patient no change in vitals since last visit, unable to obtain new vitals due to telehealth visit

## 2023-05-07 NOTE — Progress Notes (Signed)
Subjective:   Joan Ritter is a 77 y.o. female who presents for Medicare Annual (Subsequent) preventive examination.  Visit Complete: Virtual  I connected with  Joan Ritter on 05/07/23 by a audio enabled telemedicine application and verified that I am speaking with the correct person using two identifiers.  Patient Location: Other:  while traveling to [place] in the car  Provider Location: Office/Clinic  I discussed the limitations of evaluation and management by telemedicine. The patient expressed understanding and agreed to proceed.  Review of Systems     Cardiac Risk Factors include: dyslipidemia     Objective:    Today's Vitals   05/07/23 1417 05/07/23 1418  Weight: 121 lb (54.9 kg)   Height: 5\' 3"  (1.6 m)   PainSc:  6    Body mass index is 21.43 kg/m.     05/07/2023    2:30 PM 04/08/2023    4:44 AM 05/01/2022    2:36 PM 04/08/2021   12:39 PM 04/07/2020   12:42 PM 03/25/2019   12:09 PM 01/10/2019   11:17 AM  Advanced Directives  Does Patient Have a Medical Advance Directive? No No No No No No No  Does patient want to make changes to medical advance directive?      Yes (MAU/Ambulatory/Procedural Areas - Information given)   Would patient like information on creating a medical advance directive?   No - Patient declined No - Patient declined No - Patient declined  No - Patient declined    Current Medications (verified) Outpatient Encounter Medications as of 05/07/2023  Medication Sig   Acetaminophen (TYLENOL ARTHRITIS PAIN PO) Take by mouth 2 (two) times daily.   acyclovir (ZOVIRAX) 800 MG tablet TAKE 1 TABLET (800 MG TOTAL) BY MOUTH AS DIRECTED.   aspirin 81 MG tablet Take 81 mg by mouth daily.   Calcium Carbonate-Vit D-Min 600-400 MG-UNIT TABS Take 1 tablet by mouth 2 (two) times daily.   cetirizine (ZYRTEC) 10 MG tablet Take 10 mg by mouth daily as needed.    clopidogrel (PLAVIX) 75 MG tablet Take 1 tablet (75 mg total) by mouth daily.   famotidine (PEPCID) 20  MG tablet Take 1 tablet (20 mg total) by mouth daily.   fluticasone (FLONASE) 50 MCG/ACT nasal spray USE 2 SPRAYS EACH NOSTRIL EVERY DAY   Multiple Vitamin (MULTIVITAMIN) tablet Take 1 tablet by mouth daily.   ondansetron (ZOFRAN-ODT) 8 MG disintegrating tablet Take 1 tablet (8 mg total) by mouth every 8 (eight) hours as needed for nausea or vomiting.   pantoprazole (PROTONIX) 40 MG tablet Take 1 tablet (40 mg total) by mouth daily.   Probiotic Product (PROBIOTIC-10 PO) Take by mouth.   rosuvastatin (CRESTOR) 20 MG tablet Take 1 tablet (20 mg total) by mouth daily.   No facility-administered encounter medications on file as of 05/07/2023.    Allergies (verified) No known drug allergy   History: Past Medical History:  Diagnosis Date   Allergic rhinitis    seasonal   Elevated TSH    Esophagitis    GERD (gastroesophageal reflux disease)    History of Helicobacter pylori infection 2012   Hypercholesterolemia    Osteopenia    TIA (transient ischemic attack) 2001   Past Surgical History:  Procedure Laterality Date   BREAST BIOPSY Right 2000   neg   BREAST BIOPSY Right 05/18/2017   adipose tissue, coil marker   BREAST EXCISIONAL BIOPSY Right 1970   NEG   COLONOSCOPY  2005   normal  ESOPHAGOGASTRODUODENOSCOPY  2012   normal    HEMORRHOID SURGERY  1980s   Family History  Problem Relation Age of Onset   Lymphoma Father    CVA Mother        h/o cerebral hemorrhage   Brain cancer Brother    Cardiomyopathy Sister        enlarged heart, died age 57 - sudden death   Breast cancer Maternal Aunt        x2   Colon cancer Neg Hx    Social History   Socioeconomic History   Marital status: Married    Spouse name: Not on file   Number of children: 2   Years of education: Not on file   Highest education level: Not on file  Occupational History   Not on file  Tobacco Use   Smoking status: Never   Smokeless tobacco: Never  Vaping Use   Vaping status: Never Used  Substance  and Sexual Activity   Alcohol use: No    Alcohol/week: 0.0 standard drinks of alcohol   Drug use: No   Sexual activity: Yes    Birth control/protection: Post-menopausal, Abstinence  Other Topics Concern   Not on file  Social History Narrative   Not on file   Social Determinants of Health   Financial Resource Strain: Low Risk  (05/07/2023)   Overall Financial Resource Strain (CARDIA)    Difficulty of Paying Living Expenses: Not hard at all  Food Insecurity: No Food Insecurity (05/07/2023)   Hunger Vital Sign    Worried About Running Out of Food in the Last Year: Never true    Ran Out of Food in the Last Year: Never true  Transportation Needs: No Transportation Needs (05/07/2023)   PRAPARE - Administrator, Civil Service (Medical): No    Lack of Transportation (Non-Medical): No  Physical Activity: Insufficiently Active (05/07/2023)   Exercise Vital Sign    Days of Exercise per Week: 4 days    Minutes of Exercise per Session: 30 min  Stress: No Stress Concern Present (05/07/2023)   [employer] of Occupational Health - Occupational Stress Questionnaire    Feeling of Stress : Not at all  Social Connections: Socially Integrated (05/07/2023)   Social Connection and Isolation Panel [NHANES]    Frequency of Communication with Friends and Family: More than three times a week    Frequency of Social Gatherings with Friends and Family: More than three times a week    Attends Religious Services: More than 4 times per year    Active Member of [employer] or Organizations: Yes    Attends Engineer, structural: More than 4 times per year    Marital Status: Married    Tobacco Counseling Counseling given: Not Answered   Clinical Intake:  Pre-visit preparation completed: Yes  Pain : 0-10 Pain Score: 6  Pain Type: Chronic pain Pain Location: Leg (upper) Pain Orientation: Right, Left Pain Descriptors / Indicators: Aching Pain Onset: More than a month ago Pain Frequency:  Intermittent Pain Relieving Factors: Physical therapy, Tylenol as needed  Pain Relieving Factors: Physical therapy, Tylenol as needed  BMI - recorded: 21.43 Nutritional Status: BMI of 19-24  Normal Nutritional Risks: None Diabetes: No  How often do you need to have someone help you when you read instructions, pamphlets, or other written materials from your doctor or pharmacy?: 1 - Never  Interpreter Needed?: No  Information entered by :: R Terrelle Ruffolo LPN   Activities of Daily Living  05/07/2023    2:21 PM  In your present state of health, do you have any difficulty performing the following activities:  Hearing? 1  Comment aids  Vision? 0  Comment readers  Difficulty concentrating or making decisions? 0  Walking or climbing stairs? 0  Dressing or bathing? 0  Doing errands, shopping? 0  Preparing Food and eating ? N  Using the Toilet? N  In the past six months, have you accidently leaked urine? N  Do you have problems with loss of bowel control? N  Managing your Medications? N  Managing your Finances? N  Housekeeping or managing your Housekeeping? N    Patient Care Team: Dale [place], MD as PCP - General (Internal Medicine)  Indicate any recent Medical Services you may have received from other than [facility] providers in the past year (date may be approximate).     Assessment:   This is a routine wellness examination for Joan Ritter.  Hearing/Vision screen Hearing Screening - Comments:: Wears aids Vision Screening - Comments:: Glasses as needed  Dietary issues and exercise activities discussed:     Goals Addressed             This Visit's Progress    Patient Stated       None       Depression Screen    05/07/2023    2:26 PM 02/27/2023   11:27 AM 08/14/2022    1:02 PM 05/01/2022    2:42 PM 02/01/2022    8:10 AM 08/03/2021    8:40 AM 04/08/2021   12:38 PM  PHQ 2/9 Scores  PHQ - 2 Score 0 0 0 0 0 0 0  PHQ- 9 Score 0          Fall Risk    05/07/2023    2:28  PM 02/27/2023   11:27 AM 08/14/2022    1:02 PM 05/01/2022    2:43 PM 02/01/2022    8:10 AM  Fall Risk   Falls in the past year? 0 0 0 0 0  Number falls in past yr: 0 0 0    Injury with Fall? 0 0 0    Risk for fall due to : No Fall Risks No Fall Risks No Fall Risks  No Fall Risks  Follow up Falls prevention discussed;Falls evaluation completed Falls evaluation completed  Falls evaluation completed Falls evaluation completed    MEDICARE RISK AT HOME:  Medicare Risk at Home - 05/07/23 1428     Any stairs in or around the home? Yes    If so, are there any without handrails? Yes    Home free of loose throw rugs in walkways, pet beds, electrical cords, etc? Yes    Adequate lighting in your home to reduce risk of falls? Yes    Life alert? No    Use of a cane, walker or w/c? No    Grab bars in the bathroom? Yes    Shower chair or bench in shower? Yes    Elevated toilet seat or a handicapped toilet? Yes               Cognitive Function:    02/22/2017    3:32 PM  MMSE - Mini Mental State Exam  Orientation to time 5  Orientation to Place 5  Registration 3  Attention/ Calculation 5  Recall 3  Language- name 2 objects 2  Language- repeat 1  Language- follow 3 step command 3  Language- read & follow direction 1  Write a sentence 1  Copy design 1  Total score 30        05/07/2023    2:31 PM 04/07/2020   12:45 PM 03/25/2019   12:19 PM  6CIT Screen  What Year? 0 points 0 points 0 points  What month? 0 points 0 points 0 points  What time? 0 points  0 points  Count back from 20 0 points  0 points  Months in reverse 0 points 0 points 0 points  Repeat phrase 0 points 0 points 0 points  Total Score 0 points  0 points    Immunizations Immunization History  Administered Date(s) Administered   Fluad Quad(high Dose 65+) 08/03/2021, 08/14/2022   Influenza Split 08/18/2014   Influenza, High Dose Seasonal PF 06/28/2017, 07/03/2018, 06/12/2019   Influenza-Unspecified 07/23/2013,  09/01/2015, 07/26/2016   PFIZER(Purple Top)SARS-COV-2 Vaccination 11/12/2019, 12/03/2019, 08/11/2020   Pneumococcal Conjugate-13 05/01/2014   Pneumococcal Polysaccharide-23 08/21/2017   Rsv, Bivalent, Protein Subunit Rsvpref,pf Verdis Frederickson) 11/06/2022   Zoster Recombinant(Shingrix) 02/22/2017, 06/28/2017   Zoster, Live 10/23/2005    TDAP status: Due, Education has been provided regarding the importance of this vaccine. Advised may receive this vaccine at local pharmacy or Health Dept. Aware to provide a copy of the vaccination record if obtained from local pharmacy or Health Dept. Verbalized acceptance and understanding.  Flu Vaccine status: Up to date  Pneumococcal vaccine status: Up to date  Covid-19 vaccine status: Completed vaccines  Qualifies for Shingles Vaccine? Yes   Zostavax completed Yes   Shingrix Completed?: Yes  Screening Tests Health Maintenance  Topic Date Due   Hepatitis C Screening  Never done   DTaP/Tdap/Td (1 - Tdap) Never done   COVID-19 Vaccine (4 - 2023-24 season) 08/15/2023 (Originally 06/23/2022)   INFLUENZA VACCINE  05/24/2023   MAMMOGRAM  06/17/2023   Medicare Annual Wellness (AWV)  05/06/2024   Pneumonia Vaccine 53+ Years old  Completed   DEXA SCAN  Completed   Zoster Vaccines- Shingrix  Completed   HPV VACCINES  Aged Out   Colonoscopy  Discontinued    Health Maintenance  Health Maintenance Due  Topic Date Due   Hepatitis C Screening  Never done   DTaP/Tdap/Td (1 - Tdap) Never done    Colorectal cancer screening: No longer required.   Mammogram status: Completed 8/23. Repeat every year Patient will schedule when she gets home from her trip  Bone Density status: Completed 7/21. Results reflect: Bone density results: OSTEOPENIA. Repeat every 2 years. Patient is unsure when she needs to have the next one. Will discuss with PCP at her next visit.  Patient is traveling now.  Lung Cancer Screening: (Low Dose CT Chest recommended if Age 62-80 years,  20 pack-year currently smoking OR have quit w/in 15years.) does not qualify.     Additional Screening:  Hepatitis C Screening: does qualify; Completed due has not had this done.   Vision Screening: Recommended annual ophthalmology exams for early detection of glaucoma and other disorders of the eye. Is the patient up to date with their annual eye exam?  No  Who is the provider or what is the name of the office in which the patient attends annual eye exams? [facility] If pt is not established with a provider, would they like to be referred to a provider to establish care? No .   Dental Screening: Recommended annual dental exams for proper oral hygiene    Community Resource Referral / Chronic Care Management: CRR required this visit?  No  CCM required this visit?  No     Plan:     I have personally reviewed and noted the following in the patient's chart:   Medical and social history Use of alcohol, tobacco or illicit drugs  Current medications and supplements including opioid prescriptions. Patient is not currently taking opioid prescriptions. Functional ability and status Nutritional status Physical activity Advanced directives List of other physicians Hospitalizations, surgeries, and ER visits in previous 12 months Vitals Screenings to include cognitive, depression, and falls Referrals and appointments  In addition, I have reviewed and discussed with patient certain preventive protocols, quality metrics, and best practice recommendations. A written personalized care plan for preventive services as well as general preventive health recommendations were provided to patient.     Sydell Axon, LPN   1/61/0960   After Visit Summary: (MyChart) Due to this being a telephonic visit, the after visit summary with patients personalized plan was offered to patient via MyChart   Nurse Notes: None. Patient was traveling in a vehicle to [place]. Lost the call once.

## 2023-05-11 DIAGNOSIS — M6281 Muscle weakness (generalized): Secondary | ICD-10-CM | POA: Diagnosis not present

## 2023-05-11 DIAGNOSIS — M79605 Pain in left leg: Secondary | ICD-10-CM | POA: Diagnosis not present

## 2023-05-11 DIAGNOSIS — M5416 Radiculopathy, lumbar region: Secondary | ICD-10-CM | POA: Diagnosis not present

## 2023-05-11 DIAGNOSIS — M79604 Pain in right leg: Secondary | ICD-10-CM | POA: Diagnosis not present

## 2023-05-14 ENCOUNTER — Other Ambulatory Visit: Payer: Self-pay | Admitting: Internal Medicine

## 2023-05-14 DIAGNOSIS — Z1231 Encounter for screening mammogram for malignant neoplasm of breast: Secondary | ICD-10-CM

## 2023-05-15 DIAGNOSIS — M6281 Muscle weakness (generalized): Secondary | ICD-10-CM | POA: Diagnosis not present

## 2023-05-15 DIAGNOSIS — M79604 Pain in right leg: Secondary | ICD-10-CM | POA: Diagnosis not present

## 2023-05-15 DIAGNOSIS — M5416 Radiculopathy, lumbar region: Secondary | ICD-10-CM | POA: Diagnosis not present

## 2023-05-15 DIAGNOSIS — M79605 Pain in left leg: Secondary | ICD-10-CM | POA: Diagnosis not present

## 2023-05-17 DIAGNOSIS — M79604 Pain in right leg: Secondary | ICD-10-CM | POA: Diagnosis not present

## 2023-05-17 DIAGNOSIS — M6281 Muscle weakness (generalized): Secondary | ICD-10-CM | POA: Diagnosis not present

## 2023-05-17 DIAGNOSIS — M5416 Radiculopathy, lumbar region: Secondary | ICD-10-CM | POA: Diagnosis not present

## 2023-05-17 DIAGNOSIS — M79605 Pain in left leg: Secondary | ICD-10-CM | POA: Diagnosis not present

## 2023-05-22 DIAGNOSIS — M6281 Muscle weakness (generalized): Secondary | ICD-10-CM | POA: Diagnosis not present

## 2023-05-22 DIAGNOSIS — M79605 Pain in left leg: Secondary | ICD-10-CM | POA: Diagnosis not present

## 2023-05-22 DIAGNOSIS — M79604 Pain in right leg: Secondary | ICD-10-CM | POA: Diagnosis not present

## 2023-05-22 DIAGNOSIS — M5416 Radiculopathy, lumbar region: Secondary | ICD-10-CM | POA: Diagnosis not present

## 2023-05-22 DIAGNOSIS — N362 Urethral caruncle: Secondary | ICD-10-CM | POA: Diagnosis not present

## 2023-05-22 DIAGNOSIS — N952 Postmenopausal atrophic vaginitis: Secondary | ICD-10-CM | POA: Diagnosis not present

## 2023-05-22 DIAGNOSIS — N958 Other specified menopausal and perimenopausal disorders: Secondary | ICD-10-CM | POA: Diagnosis not present

## 2023-05-24 DIAGNOSIS — M79605 Pain in left leg: Secondary | ICD-10-CM | POA: Diagnosis not present

## 2023-05-24 DIAGNOSIS — M79604 Pain in right leg: Secondary | ICD-10-CM | POA: Diagnosis not present

## 2023-05-24 DIAGNOSIS — M5416 Radiculopathy, lumbar region: Secondary | ICD-10-CM | POA: Diagnosis not present

## 2023-05-24 DIAGNOSIS — M6281 Muscle weakness (generalized): Secondary | ICD-10-CM | POA: Diagnosis not present

## 2023-05-29 DIAGNOSIS — M6281 Muscle weakness (generalized): Secondary | ICD-10-CM | POA: Diagnosis not present

## 2023-05-29 DIAGNOSIS — M5416 Radiculopathy, lumbar region: Secondary | ICD-10-CM | POA: Diagnosis not present

## 2023-05-29 DIAGNOSIS — M79604 Pain in right leg: Secondary | ICD-10-CM | POA: Diagnosis not present

## 2023-05-29 DIAGNOSIS — M79605 Pain in left leg: Secondary | ICD-10-CM | POA: Diagnosis not present

## 2023-06-05 DIAGNOSIS — M5416 Radiculopathy, lumbar region: Secondary | ICD-10-CM | POA: Diagnosis not present

## 2023-06-10 ENCOUNTER — Other Ambulatory Visit: Payer: Self-pay | Admitting: Internal Medicine

## 2023-06-12 DIAGNOSIS — M5416 Radiculopathy, lumbar region: Secondary | ICD-10-CM | POA: Diagnosis not present

## 2023-06-12 DIAGNOSIS — M79605 Pain in left leg: Secondary | ICD-10-CM | POA: Diagnosis not present

## 2023-06-12 DIAGNOSIS — M6281 Muscle weakness (generalized): Secondary | ICD-10-CM | POA: Diagnosis not present

## 2023-06-12 DIAGNOSIS — M79604 Pain in right leg: Secondary | ICD-10-CM | POA: Diagnosis not present

## 2023-06-14 ENCOUNTER — Other Ambulatory Visit: Payer: Self-pay | Admitting: Internal Medicine

## 2023-06-18 ENCOUNTER — Ambulatory Visit
Admission: RE | Admit: 2023-06-18 | Discharge: 2023-06-18 | Disposition: A | Discharge status: Home / Self Care | Payer: Medicare PPO | Source: Ambulatory Visit | Attending: Internal Medicine | Admitting: Internal Medicine

## 2023-06-18 DIAGNOSIS — Z1231 Encounter for screening mammogram for malignant neoplasm of breast: Secondary | ICD-10-CM | POA: Insufficient documentation

## 2023-07-11 DIAGNOSIS — L570 Actinic keratosis: Secondary | ICD-10-CM | POA: Diagnosis not present

## 2023-07-11 DIAGNOSIS — D485 Neoplasm of uncertain behavior of skin: Secondary | ICD-10-CM | POA: Diagnosis not present

## 2023-07-11 DIAGNOSIS — Z85828 Personal history of other malignant neoplasm of skin: Secondary | ICD-10-CM | POA: Diagnosis not present

## 2023-07-11 DIAGNOSIS — Z86018 Personal history of other benign neoplasm: Secondary | ICD-10-CM | POA: Diagnosis not present

## 2023-07-11 DIAGNOSIS — L578 Other skin changes due to chronic exposure to nonionizing radiation: Secondary | ICD-10-CM | POA: Diagnosis not present

## 2023-07-11 DIAGNOSIS — C44519 Basal cell carcinoma of skin of other part of trunk: Secondary | ICD-10-CM | POA: Diagnosis not present

## 2023-07-11 DIAGNOSIS — Z872 Personal history of diseases of the skin and subcutaneous tissue: Secondary | ICD-10-CM | POA: Diagnosis not present

## 2023-07-15 ENCOUNTER — Other Ambulatory Visit: Payer: Self-pay | Admitting: Internal Medicine

## 2023-08-13 DIAGNOSIS — H26493 Other secondary cataract, bilateral: Secondary | ICD-10-CM | POA: Diagnosis not present

## 2023-08-13 DIAGNOSIS — Z961 Presence of intraocular lens: Secondary | ICD-10-CM | POA: Diagnosis not present

## 2023-08-14 DIAGNOSIS — C4491 Basal cell carcinoma of skin, unspecified: Secondary | ICD-10-CM | POA: Diagnosis not present

## 2023-08-14 DIAGNOSIS — C44519 Basal cell carcinoma of skin of other part of trunk: Secondary | ICD-10-CM | POA: Diagnosis not present

## 2023-08-27 ENCOUNTER — Ambulatory Visit
Admission: EM | Admit: 2023-08-27 | Discharge: 2023-08-27 | Disposition: A | Discharge status: Home / Self Care | Payer: Medicare PPO | Attending: Physician Assistant | Admitting: Physician Assistant

## 2023-08-27 DIAGNOSIS — J340 Abscess, furuncle and carbuncle of nose: Secondary | ICD-10-CM

## 2023-08-27 MED ORDER — CEPHALEXIN 500 MG PO CAPS: 500.0000 mg | ORAL_CAPSULE | Freq: Four times a day (QID) | ORAL | 0 refills | Status: AC

## 2023-08-27 NOTE — ED Triage Notes (Signed)
Sx started Friday. Nose is sore to the touch.

## 2023-08-27 NOTE — Discharge Instructions (Signed)
-  Exam significant for concerns about minor developing bacterial infection of skin of nose.  I sent antibiotics to pharmacy.  You may also apply warm compresses to the area and take Tylenol. - If you notice increased swelling, redness or pain seek reevaluation.

## 2023-08-27 NOTE — ED Provider Notes (Signed)
[facility]    CSN: [id number] Arrival date & time: 08/27/23  0865      History   Chief Complaint Chief Complaint  Patient presents with   sore nose    HPI Joan Ritter is a 77 y.o. female presenting for redness, swelling and pain of the external nose for the past couple days.  Denies sinus pressure/pain or nasal congestion.  No associated fever, cough.  No injury to nose.  Taking Tylenol.  HPI  Past Medical History:  Diagnosis Date   Allergic rhinitis    seasonal   Elevated TSH    Esophagitis    GERD (gastroesophageal reflux disease)    History of Helicobacter pylori infection 2012   Hypercholesterolemia    Osteopenia    TIA (transient ischemic attack) 2001    Patient Active Problem List   Diagnosis Date Noted   Vaginal irritation 01/06/2023   Leg pain 08/14/2022   CKD (chronic kidney disease) stage 3, GFR 30-59 ml/min (HCC) 08/06/2021   Abnormal mammogram 08/08/2020   Right knee pain 01/11/2019   Left carotid bruit 02/24/2018   Suprapubic pressure 11/16/2017   Back pain 05/09/2017   Breast nodule 05/09/2017   Knee pain, bilateral 04/20/2015   Health care maintenance 04/20/2015   Medicare annual wellness visit, subsequent 10/09/2014   Encounter for screening colonoscopy 07/07/2014   Pre-op evaluation 09/07/2013   History of TIA (transient ischemic attack) 09/08/2012   GERD (gastroesophageal reflux disease) 09/08/2012   Hypercholesterolemia 09/08/2012   Osteopenia 09/08/2012   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 Authorizing Provider  cephALEXin (KEFLEX) 500 MG capsule Take 1 capsule (500 mg total) by mouth 4 (four) times daily for 5 days. 08/27/23 09/01/23 Yes Shirlee Latch, PA-C  clopidogrel (PLAVIX) 75 MG tablet Take 1 tablet (75 mg total) by mouth daily. 06/13/23  Yes Dale Woodloch, MD  famotidine (PEPCID) 20 MG tablet Take 1 tablet (20 mg total) by mouth daily. 06/18/23  Yes Dale Colbert, MD  Multiple Vitamin (MULTIVITAMIN) tablet Take 1 tablet by mouth daily.   Yes [provider]  pantoprazole (PROTONIX) 40 MG tablet Take 1 tablet (40 mg total) by mouth daily. 01/26/23  Yes Dale Flournoy, MD  Probiotic Product (PROBIOTIC-10 PO) Take by mouth.   Yes [provider]  rosuvastatin (CRESTOR) 20 MG tablet Take 1 tablet (20 mg total) by mouth daily. 07/16/23  Yes Dale Nenana, MD  Acetaminophen (TYLENOL ARTHRITIS PAIN PO) Take by mouth 2 (two) times daily.    [provider]  acyclovir (ZOVIRAX) 800  MG tablet TAKE 1 TABLET (800 MG TOTAL) BY MOUTH AS DIRECTED. 06/14/21   Dale Rockingham, MD  aspirin 81 MG tablet Take 81 mg by mouth daily.    [provider]  Calcium Carbonate-Vit D-Min 600-400 MG-UNIT TABS Take 1 tablet by mouth 2 (two) times daily.    [provider]  cetirizine (ZYRTEC) 10 MG tablet Take 10 mg by mouth daily as needed.     [provider]  fluticasone (FLONASE) 50 MCG/ACT nasal spray USE 2 SPRAYS EACH NOSTRIL EVERY DAY 11/01/12   Dale [place], MD  ondansetron (ZOFRAN-ODT) 8 MG disintegrating tablet Take 1 tablet (8 mg total) by mouth every 8 (eight) hours as needed for nausea or vomiting. 04/08/23   Merwyn Katos, MD    Family History Family History  Problem Relation Age of Onset   Lymphoma Father    CVA Mother        h/o cerebral hemorrhage   Brain cancer Brother     Cardiomyopathy Sister        enlarged heart, died age 67 - sudden death   Breast cancer Maternal Aunt        x2   Colon cancer Neg Hx     Social History Social History   Tobacco Use   Smoking status: Never   Smokeless tobacco: Never  Vaping Use   Vaping status: Never Used  Substance Use Topics   Alcohol use: No    Alcohol/week: 0.0 standard drinks of alcohol   Drug use: No     Allergies   Gabapentin and No known drug allergy   Review of Systems Review of Systems  Constitutional:  Negative for chills, diaphoresis, fatigue and fever.  HENT:  Positive for facial swelling. Negative for congestion, ear pain, rhinorrhea, sinus pressure and sore throat.        +nasal pain  Respiratory:  Negative for cough and shortness of breath.   Gastrointestinal:  Negative for abdominal pain, nausea and vomiting.  Musculoskeletal:  Negative for arthralgias and myalgias.  Skin:  Positive for color change. Negative for rash.  Neurological:  Negative for weakness and headaches.  Hematological:  Negative for adenopathy.     Physical Exam Triage Vital Signs ED Triage Vitals  Encounter Vitals Group     BP      Systolic BP Percentile      Diastolic BP Percentile      Pulse      Resp      Temp      Temp src      SpO2      Weight      Height      Head Circumference      Peak Flow      Pain Score      Pain Loc      Pain Education      Exclude from Growth Chart    No data found.  Updated Vital Signs BP 125/75 (BP Location: Left Arm)   Pulse 62   Temp 98.2 F (36.8 C) (Oral)   Resp 17   SpO2 100%     Physical Exam Vitals and nursing note reviewed.  Constitutional:      General: She is not in acute distress.    Appearance: Normal appearance. She is not ill-appearing or toxic-appearing.  HENT:     Head: Normocephalic and atraumatic.     Nose: Nasal tenderness present. No rhinorrhea.      Comments: There is erythema and  swelling at the tip of the nose with TTP. No  internal swelling or lesions.    Mouth/Throat:     Mouth: Mucous membranes are moist.     Pharynx: Oropharynx is clear.  Eyes:     General: No scleral icterus.       Right eye: No discharge.        Left eye: No discharge.     Conjunctiva/sclera: Conjunctivae normal.  Cardiovascular:     Rate and Rhythm: Normal rate and regular rhythm.  Pulmonary:     Effort: Pulmonary effort is normal. No respiratory distress.  Musculoskeletal:     Cervical back: Neck supple.  Skin:    General: Skin is dry.  Neurological:     General: No focal deficit present.     Mental Status: She is alert. Mental status is at baseline.     Motor: No weakness.     Gait: Gait normal.  Psychiatric:        Mood and Affect: Mood normal.      UC Treatments / Results  Labs (all labs ordered are listed, but only abnormal results are displayed) Labs Reviewed - No data to display  EKG   Radiology No results found.  Procedures Procedures (including critical care time)  Medications Ordered in UC Medications - No data to display  Initial Impression / Assessment and Plan / UC Course  I have reviewed the triage vital signs and the nursing notes.  Pertinent labs & imaging results that were available during my care of the patient were reviewed by me and considered in my medical decision making (see chart for details).   77 year old female presents for redness, swelling and tenderness of the external nose for the past couple days.  On exam she has erythema and swelling of the tip of the nose and tenderness.  Suspicious for early cellulitis.  Treating at this time with Keflex.  Also advised supportive care.  Reviewed close monitoring and return precautions discussed.   Final Clinical Impressions(s) / UC Diagnoses   Final diagnoses:  Cellulitis of nose, external     Discharge Instructions      -Exam significant for concerns about minor developing bacterial infection of skin of nose.  I sent antibiotics  to pharmacy.  You may also apply warm compresses to the area and take Tylenol. - If you notice increased swelling, redness or pain seek reevaluation.     ED Prescriptions     Medication Sig Dispense Auth. Provider   cephALEXin (KEFLEX) 500 MG capsule Take 1 capsule (500 mg total) by mouth 4 (four) times daily for 5 days. 20 capsule Shirlee Latch, PA-C      PDMP not reviewed this encounter.   Shirlee Latch, PA-C 08/27/23 1108

## 2023-09-07 ENCOUNTER — Other Ambulatory Visit (INDEPENDENT_AMBULATORY_CARE_PROVIDER_SITE_OTHER): Payer: Medicare PPO

## 2023-09-07 DIAGNOSIS — N1831 Chronic kidney disease, stage 3a: Secondary | ICD-10-CM

## 2023-09-07 DIAGNOSIS — E7800 Pure hypercholesterolemia, unspecified: Secondary | ICD-10-CM

## 2023-09-07 LAB — BASIC METABOLIC PANEL
BUN: 9 mg/dL (ref 6–23)
CO2: 30 meq/L (ref 19–32)
Calcium: 8.8 mg/dL (ref 8.4–10.5)
Chloride: 106 meq/L (ref 96–112)
Creatinine, Ser: 0.89 mg/dL (ref 0.40–1.20)
GFR: 62.38 mL/min (ref >60.00)
Glucose, Bld: 96 mg/dL (ref 70–99)
Potassium: 3.9 meq/L (ref 3.5–5.1)
Sodium: 142 meq/L (ref 135–145)

## 2023-09-07 LAB — CBC WITH DIFFERENTIAL/PLATELET
Basophils Absolute: 0.1 10*3/uL (ref 0.0–0.1)
Basophils Relative: 0.7 % (ref 0.0–3.0)
Eosinophils Absolute: 0.3 10*3/uL (ref 0.0–0.7)
Eosinophils Relative: 3 % (ref 0.0–5.0)
HCT: 39.7 % (ref 36.0–46.0)
Hemoglobin: 13.4 g/dL (ref 12.0–15.0)
Lymphocytes Relative: 22.7 % (ref 12.0–46.0)
Lymphs Abs: 2 10*3/uL (ref 0.7–4.0)
MCHC: 33.8 g/dL (ref 30.0–36.0)
MCV: 97.9 fL (ref 78.0–100.0)
Monocytes Absolute: 0.7 10*3/uL (ref 0.1–1.0)
Monocytes Relative: 7.5 % (ref 3.0–12.0)
Neutro Abs: 5.9 10*3/uL (ref 1.4–7.7)
Neutrophils Relative %: 66.1 % (ref 43.0–77.0)
Platelets: 254 10*3/uL (ref 150.0–400.0)
RBC: 4.06 Mil/uL (ref 3.87–5.11)
RDW: 12.4 % (ref 11.5–15.5)
WBC: 8.9 10*3/uL (ref 4.0–10.5)

## 2023-09-07 LAB — LIPID PANEL
Cholesterol: 125 mg/dL (ref 0–200)
HDL: 54.7 mg/dL (ref >39.00)
LDL Cholesterol: 53 mg/dL (ref 0–99)
NonHDL: 70.43
Total CHOL/HDL Ratio: 2
Triglycerides: 88 mg/dL (ref 0.0–149.0)
VLDL: 17.6 mg/dL (ref 0.0–40.0)

## 2023-09-07 LAB — HEPATIC FUNCTION PANEL
ALT: 14 U/L (ref 0–35)
AST: 17 U/L (ref 0–37)
Albumin: 3.6 g/dL (ref 3.5–5.2)
Alkaline Phosphatase: 57 U/L (ref 39–117)
Bilirubin, Direct: 0.1 mg/dL (ref 0.0–0.3)
Total Bilirubin: 0.5 mg/dL (ref 0.2–1.2)
Total Protein: 6.2 g/dL (ref 6.0–8.3)

## 2023-09-07 LAB — TSH: TSH: 3.66 u[IU]/mL (ref 0.35–5.50)

## 2023-09-09 ENCOUNTER — Other Ambulatory Visit: Payer: Self-pay | Admitting: Internal Medicine

## 2023-09-12 ENCOUNTER — Ambulatory Visit: Payer: Medicare PPO

## 2023-09-12 ENCOUNTER — Ambulatory Visit: Payer: Medicare PPO | Admitting: Internal Medicine

## 2023-09-12 ENCOUNTER — Encounter: Payer: Self-pay | Admitting: Internal Medicine

## 2023-09-12 VITALS — BP 110/68 | HR 80 | Temp 98.0°F | Resp 16 | Ht 63.0 in | Wt 121.4 lb

## 2023-09-12 DIAGNOSIS — Z8673 Personal history of transient ischemic attack (TIA), and cerebral infarction without residual deficits: Secondary | ICD-10-CM

## 2023-09-12 DIAGNOSIS — R9389 Abnormal findings on diagnostic imaging of other specified body structures: Secondary | ICD-10-CM | POA: Diagnosis not present

## 2023-09-12 DIAGNOSIS — K219 Gastro-esophageal reflux disease without esophagitis: Secondary | ICD-10-CM | POA: Diagnosis not present

## 2023-09-12 DIAGNOSIS — M79606 Pain in leg, unspecified: Secondary | ICD-10-CM | POA: Diagnosis not present

## 2023-09-12 DIAGNOSIS — M5134 Other intervertebral disc degeneration, thoracic region: Secondary | ICD-10-CM | POA: Diagnosis not present

## 2023-09-12 DIAGNOSIS — N898 Other specified noninflammatory disorders of vagina: Secondary | ICD-10-CM | POA: Diagnosis not present

## 2023-09-12 DIAGNOSIS — D179 Benign lipomatous neoplasm, unspecified: Secondary | ICD-10-CM | POA: Diagnosis not present

## 2023-09-12 DIAGNOSIS — E7800 Pure hypercholesterolemia, unspecified: Secondary | ICD-10-CM

## 2023-09-12 DIAGNOSIS — J340 Abscess, furuncle and carbuncle of nose: Secondary | ICD-10-CM

## 2023-09-12 MED ORDER — DOXYCYCLINE HYCLATE 100 MG PO TABS
100.0000 mg | ORAL_TABLET | Freq: Two times a day (BID) | ORAL | 0 refills | Status: DC
Start: 2023-09-12 — End: 2023-10-31

## 2023-09-12 NOTE — Progress Notes (Signed)
Subjective:    Patient ID: Joan Ritter, female    DOB: 1946-08-07, 77 y.o.   MRN: [id number]  Patient here for  Chief Complaint  Patient presents with   Medical Management of Chronic Issues    HPI Here for a scheduled follow up - f/u regarding her cholesterol and GERD. Saw gyn 03/22/23 - vaginal discomfort. Started on estrogen cream. Also saw ortho 03/26/23 - lower extremity pain - felt to be related to lumbar spine etiology.  Recommended PT and prescribed medrol dosepak. Evaluated at urgent care 08/27/23 - cellulitis nose.  Treated with keflex.  Still with some tenderness with palpation.  Some redness.  Better, but some persistent symptoms. Was seen in ER 03/2023 - nausea and vomiting.  CT then revealed right lower lobe bronchopneumonia and a left lower pole renal mass.  Recommended f/u with urology. No chest congestion.  No sob.  Discussed f/u cxr to confirm clear. No abdominal pain or bowel change. No GI symptoms.    Past Medical History:  Diagnosis Date   Allergic rhinitis    seasonal   Elevated TSH    Esophagitis    GERD (gastroesophageal reflux disease)    History of Helicobacter pylori infection 2012   Hypercholesterolemia    Osteopenia    TIA (transient ischemic attack) 2001   Past Surgical History:  Procedure Laterality Date   BREAST BIOPSY Right 2000   neg   BREAST BIOPSY Right 05/18/2017   adipose tissue, coil marker   BREAST EXCISIONAL BIOPSY Right 1970   NEG   COLONOSCOPY  2005   normal    ESOPHAGOGASTRODUODENOSCOPY  2012   normal    HEMORRHOID SURGERY  1980s   Family History  Problem Relation Age of Onset   Lymphoma Father    CVA Mother        h/o cerebral hemorrhage   Brain cancer Brother    Cardiomyopathy Sister        enlarged heart, died age 12 - sudden death   Breast cancer Maternal Aunt        x2   Colon cancer Neg Hx    Social History   Socioeconomic History   Marital status: Married    Spouse name: Not on file   Number of children: 2    Years of education: Not on file   Highest education level: Bachelor's degree (e.g., BA, AB, BS)  Occupational History   Not on file  Tobacco Use   Smoking status: Never   Smokeless tobacco: Never  Vaping Use   Vaping status: Never Used  Substance and Sexual Activity   Alcohol use: No    Alcohol/week: 0.0 standard drinks of alcohol   Drug use: No   Sexual activity: Yes    Birth control/protection: Post-menopausal, Abstinence  Other Topics Concern   Not on file  Social History Narrative   Not on file   Social Determinants of Health   Financial Resource Strain: Low Risk  (09/07/2023)   Overall Financial Resource Strain (CARDIA)    Difficulty of Paying Living Expenses: Not hard at all  Food Insecurity: No Food Insecurity (09/07/2023)   Hunger Vital Sign    Worried About Running Out of Food in the Last Year: Never true    Ran Out of Food in the Last Year: Never true  Transportation Needs: No Transportation Needs (09/07/2023)   PRAPARE - Administrator, Civil Service (Medical): No    Lack of Transportation (Non-Medical): No  Physical  Activity: Insufficiently Active (09/07/2023)   Exercise Vital Sign    Days of Exercise per Week: 4 days    Minutes of Exercise per Session: 30 min  Stress: No Stress Concern Present (09/07/2023)   [employer] of Occupational Health - Occupational Stress Questionnaire    Feeling of Stress : Not at all  Social Connections: Socially Integrated (09/07/2023)   Social Connection and Isolation Panel [NHANES]    Frequency of Communication with Friends and Family: More than three times a week    Frequency of Social Gatherings with Friends and Family: Twice a week    Attends Religious Services: More than 4 times per year    Active Member of [employer] or Organizations: Yes    Attends Engineer, structural: More than 4 times per year    Marital Status: Married     Review of Systems  Constitutional:  Negative for appetite change and  unexpected weight change.  HENT:  Negative for congestion and sinus pressure.   Respiratory:  Negative for cough, chest tightness and shortness of breath.   Cardiovascular:  Negative for chest pain and palpitations.  Gastrointestinal:  Negative for abdominal pain, diarrhea, nausea and vomiting.  Genitourinary:  Negative for difficulty urinating and dysuria.  Musculoskeletal:  Negative for joint swelling and myalgias.  Skin:  Negative for color change and rash.  Neurological:  Negative for dizziness and headaches.  Psychiatric/Behavioral:  Negative for agitation and dysphoric mood.        Objective:     BP 110/68   Pulse 80   Temp 98 F (36.7 C)   Resp 16   Ht 5\' 3"  (1.6 m)   Wt 121 lb 6.4 oz (55.1 kg)   SpO2 98%   BMI 21.51 kg/m  Wt Readings from Last 3 Encounters:  09/12/23 121 lb 6.4 oz (55.1 kg)  05/07/23 121 lb (54.9 kg)  02/27/23 123 lb 9.6 oz (56.1 kg)    Physical Exam Vitals reviewed.  Constitutional:      General: She is not in acute distress.    Appearance: Normal appearance.  HENT:     Head: Normocephalic and atraumatic.     Right Ear: External ear normal.     Left Ear: External ear normal.  Eyes:     General: No scleral icterus.       Right eye: No discharge.        Left eye: No discharge.     Conjunctiva/sclera: Conjunctivae normal.  Neck:     Thyroid: No thyromegaly.  Cardiovascular:     Rate and Rhythm: Normal rate and regular rhythm.  Pulmonary:     Effort: No respiratory distress.     Breath sounds: Normal breath sounds. No wheezing.  Abdominal:     General: Bowel sounds are normal.     Palpations: Abdomen is soft.     Tenderness: There is no abdominal tenderness.  Musculoskeletal:        General: No swelling or tenderness.     Cervical back: Neck supple. No tenderness.  Lymphadenopathy:     Cervical: No cervical adenopathy.  Skin:    Findings: No erythema or rash.  Neurological:     Mental Status: She is alert.  Psychiatric:         Mood and Affect: Mood normal.        Behavior: Behavior normal.      Outpatient Encounter Medications as of 09/12/2023  Medication Sig   acetaminophen (TYLENOL) 500 MG tablet Take  1,000 mg by mouth every 6 (six) hours as needed.   Cholecalciferol (D3-1000 PO) Take 1 tablet by mouth daily.   cyanocobalamin (VITAMIN B12) 1000 MCG tablet Take 1,000 mcg by mouth daily.   doxycycline (VIBRA-TABS) 100 MG tablet Take 1 tablet (100 mg total) by mouth 2 (two) times daily.   estradiol (ESTRACE) 0.1 MG/GM vaginal cream Place 1 Applicatorful vaginally at bedtime.   Acetaminophen (TYLENOL ARTHRITIS PAIN PO) Take by mouth 2 (two) times daily.   acyclovir (ZOVIRAX) 800 MG tablet TAKE 1 TABLET (800 MG TOTAL) BY MOUTH AS DIRECTED.   aspirin 81 MG tablet Take 81 mg by mouth daily.   Calcium Carbonate-Vit D-Min 600-400 MG-UNIT TABS Take 1 tablet by mouth 2 (two) times daily.   cetirizine (ZYRTEC) 10 MG tablet Take 10 mg by mouth daily as needed.    clopidogrel (PLAVIX) 75 MG tablet Take 1 tablet (75 mg total) by mouth daily.   famotidine (PEPCID) 20 MG tablet Take 1 tablet (20 mg total) by mouth daily.   fluticasone (FLONASE) 50 MCG/ACT nasal spray USE 2 SPRAYS EACH NOSTRIL EVERY DAY   Multiple Vitamin (MULTIVITAMIN) tablet Take 1 tablet by mouth daily.   pantoprazole (PROTONIX) 40 MG tablet Take 1 tablet (40 mg total) by mouth daily.   rosuvastatin (CRESTOR) 20 MG tablet Take 1 tablet (20 mg total) by mouth daily.   [DISCONTINUED] ondansetron (ZOFRAN-ODT) 8 MG disintegrating tablet Take 1 tablet (8 mg total) by mouth every 8 (eight) hours as needed for nausea or vomiting.   [DISCONTINUED] Probiotic Product (PROBIOTIC-10 PO) Take by mouth.   No facility-administered encounter medications on file as of 09/12/2023.     Lab Results  Component Value Date   WBC 8.9 09/07/2023   HGB 13.4 09/07/2023   HCT 39.7 09/07/2023   PLT 254.0 09/07/2023   GLUCOSE 96 09/07/2023   CHOL 125 09/07/2023   TRIG 88.0  09/07/2023   HDL 54.70 09/07/2023   LDLCALC 53 09/07/2023   ALT 14 09/07/2023   AST 17 09/07/2023   NA 142 09/07/2023   K 3.9 09/07/2023   CL 106 09/07/2023   CREATININE 0.89 09/07/2023   BUN 9 09/07/2023   CO2 30 09/07/2023   TSH 3.66 09/07/2023       Assessment & Plan:  Abnormal CT scan Assessment & Plan: Recent CT scan - revealed right lower lobe bronchopneumonia.  No cough or congestion.  Recheck cxr.  Further w/up pending results.   Orders: -     DG Chest 2 View; Future -     Ambulatory referral to Urology  Vaginal irritation Assessment & Plan: Saw gyn 03/22/23 - vaginal discomfort. Started on estrogen cream.    Pain of lower extremity, unspecified laterality Assessment & Plan: Saw ortho 03/26/23 - lower extremity pain - felt to be related to lumbar spine etiology.  Recommended PT and prescribed medrol dosepak. Exercise/stretches.    Hypercholesterolemia Assessment & Plan: On crestor 20mg  q day.  Continue low cholesterol diet and exercise.  Follow lipid panel and liver function tests.    History of TIA (transient ischemic attack) Assessment & Plan: Continue plavix and crestor.  Follow.    Gastroesophageal reflux disease without esophagitis Assessment & Plan: No upper symptoms reported.  On protonix.    Angiomyolipoma Assessment & Plan: Worked up by Dr Doylene Canning (oncology) previously. At that time, felt no further w/up warranted.  Recent CT scan -  revealed a chronic left lower pole renal mass versus complicated cyst has increased  by less than 1 cm in size since 2008. Recommend follow-up with Urology. Discussed with her today.  Agreeable to referral.    Orders: -     Ambulatory referral to Urology  Cellulitis of nasal tip Assessment & Plan: Improved with keflex.  Some persistent redness.  Treat with doxycycline.  Follow.  Call with update.    Other orders -     Doxycycline Hyclate; Take 1 tablet (100 mg total) by mouth 2 (two) times daily.  Dispense: 14  tablet; Refill: 0   I spent 45 minutes with the patient. Time spent discussing her current concerns and symptoms. Time also spent discussed her nasal cellulitis, discussing previous CT results and further f/u.  Time also spent discussing further w/up, evaluation and treatment.    [employer], MD

## 2023-09-13 ENCOUNTER — Other Ambulatory Visit: Payer: Self-pay | Admitting: Internal Medicine

## 2023-09-16 ENCOUNTER — Encounter: Payer: Self-pay | Admitting: Internal Medicine

## 2023-09-16 DIAGNOSIS — J340 Abscess, furuncle and carbuncle of nose: Secondary | ICD-10-CM | POA: Insufficient documentation

## 2023-09-16 NOTE — Assessment & Plan Note (Signed)
No upper symptoms reported.  On protonix.   

## 2023-09-16 NOTE — Assessment & Plan Note (Signed)
Saw gyn 03/22/23 - vaginal discomfort. Started on estrogen cream.

## 2023-09-16 NOTE — Assessment & Plan Note (Signed)
Worked up by Dr Doylene Canning (oncology) previously. At that time, felt no further w/up warranted.  Recent CT scan -  revealed a chronic left lower pole renal mass versus complicated cyst has increased by less than 1 cm in size since 2008. Recommend follow-up with Urology. Discussed with her today.  Agreeable to referral.

## 2023-09-16 NOTE — Assessment & Plan Note (Addendum)
Recent CT scan - revealed right lower lobe bronchopneumonia.  No cough or congestion.  Recheck cxr.  Further w/up pending results.

## 2023-09-16 NOTE — Assessment & Plan Note (Signed)
Saw ortho 03/26/23 - lower extremity pain - felt to be related to lumbar spine etiology.  Recommended PT and prescribed medrol dosepak. Exercise/stretches.

## 2023-09-16 NOTE — Assessment & Plan Note (Signed)
Improved with keflex.  Some persistent redness.  Treat with doxycycline.  Follow.  Call with update.

## 2023-09-16 NOTE — Assessment & Plan Note (Signed)
On crestor 20mg  q day.  Continue low cholesterol diet and exercise.  Follow lipid panel and liver function tests.

## 2023-09-16 NOTE — Assessment & Plan Note (Signed)
Continue plavix and crestor.  Follow.

## 2023-10-02 ENCOUNTER — Telehealth: Payer: Self-pay

## 2023-10-02 NOTE — Telephone Encounter (Signed)
-----   Message from [place] sent at 09/30/2023  9:58 PM EST ----- See me before calling. Notify - cxr reveals no acute abnormality. Mention of some coarsened lung markings (question of scarring, previous infection, etc?) I would like to obtain a high resolution CT chest chest to better evaluate.  If agreeable, let me know and I will schedule.

## 2023-10-03 ENCOUNTER — Other Ambulatory Visit: Payer: Self-pay | Admitting: Internal Medicine

## 2023-10-03 DIAGNOSIS — R918 Other nonspecific abnormal finding of lung field: Secondary | ICD-10-CM

## 2023-10-03 DIAGNOSIS — R9389 Abnormal findings on diagnostic imaging of other specified body structures: Secondary | ICD-10-CM

## 2023-10-03 NOTE — Progress Notes (Signed)
Order placed for HRCT 

## 2023-10-10 ENCOUNTER — Ambulatory Visit: Payer: Self-pay | Admitting: Urology

## 2023-10-10 ENCOUNTER — Ambulatory Visit
Admission: RE | Admit: 2023-10-10 | Discharge: 2023-10-10 | Disposition: A | Discharge status: Home / Self Care | Payer: Medicare PPO | Source: Ambulatory Visit | Attending: Internal Medicine | Admitting: Internal Medicine

## 2023-10-10 DIAGNOSIS — R9389 Abnormal findings on diagnostic imaging of other specified body structures: Secondary | ICD-10-CM | POA: Diagnosis not present

## 2023-10-10 DIAGNOSIS — I700 Atherosclerosis of aorta: Secondary | ICD-10-CM | POA: Diagnosis not present

## 2023-10-10 DIAGNOSIS — R918 Other nonspecific abnormal finding of lung field: Secondary | ICD-10-CM | POA: Insufficient documentation

## 2023-10-10 DIAGNOSIS — J984 Other disorders of lung: Secondary | ICD-10-CM | POA: Diagnosis not present

## 2023-10-14 ENCOUNTER — Other Ambulatory Visit: Payer: Self-pay | Admitting: Internal Medicine

## 2023-10-22 ENCOUNTER — Other Ambulatory Visit: Payer: Self-pay | Admitting: Internal Medicine

## 2023-10-22 DIAGNOSIS — R9389 Abnormal findings on diagnostic imaging of other specified body structures: Secondary | ICD-10-CM

## 2023-10-22 NOTE — Progress Notes (Signed)
Order placed for pulmonary referral.  

## 2023-10-31 ENCOUNTER — Ambulatory Visit: Payer: Medicare PPO | Admitting: Urology

## 2023-10-31 VITALS — BP 130/80 | HR 71 | Ht 63.0 in | Wt 122.0 lb

## 2023-10-31 DIAGNOSIS — N289 Disorder of kidney and ureter, unspecified: Secondary | ICD-10-CM | POA: Diagnosis not present

## 2023-10-31 NOTE — Progress Notes (Signed)
   10/31/23 2:41 PM   Joan Ritter [birth year] [id number]  CC: Renal lesion  HPI: 78 year old female with normal renal function referred for a 2.3 cm left lower pole renal lesion.  This is actually been present since at least 2008 and has grown less than 5 mm since that time.  She denies any flank pain or gross hematuria.  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   PMH: Past Medical History:  Diagnosis Date   Allergic rhinitis    seasonal   Elevated TSH    Esophagitis    GERD (gastroesophageal reflux disease)    History of Helicobacter pylori infection 2012   Hypercholesterolemia    Osteopenia    TIA (transient ischemic attack) 2001    Surgical History: Past Surgical History:  Procedure Laterality Date   BREAST BIOPSY Right 2000   neg   BREAST BIOPSY Right 05/18/2017   adipose tissue, coil marker   BREAST EXCISIONAL BIOPSY Right 1970   NEG   COLONOSCOPY  2005   normal    ESOPHAGOGASTRODUODENOSCOPY  2012   normal    HEMORRHOID SURGERY  1980s     Family History: Family History  Problem Relation Age of Onset   Lymphoma Father    CVA Mother        h/o cerebral hemorrhage   Brain cancer Brother    Cardiomyopathy Sister        enlarged heart, died age 21 - sudden death   Breast cancer Maternal Aunt        x2   Colon cancer Neg Hx     Social History:  reports that she has never smoked. She has never used smokeless tobacco. She reports that she does not drink alcohol and does not use drugs.  Physical Exam: BP 130/80 (BP Location: Left Arm, Patient Position: Sitting, Cuff Size: Normal)   Pulse 71   Ht 5' 3 (1.6 m)   Wt 122 lb (55.3 kg)   SpO2 100%   BMI 21.61 kg/m    Constitutional:  Alert and oriented, No acute distress. Cardiovascular: No clubbing, cyanosis, or edema. Respiratory: Normal respiratory effort, no increased work of breathing. GI: Abdomen is soft, nontender, nondistended, no abdominal masses   Laboratory  Data: Reviewed  Pertinent Imaging: I have personally viewed and interpreted the most recent CT from June 2024 with a 2.3 cm left lower pole renal lesion minimally changed from prior imaging in 2008, likely AML.  Assessment & Plan:   78 year old female with a 2.3 cm left renal lesion that has been minimally changed since CT scan in 2008, growing less than 5 mm during this time.  This likely represents a benign or indolent lesion, potentially angiomyolipoma.  We discussed options like MRI for further evaluation, ongoing surveillance, or more of a watchful waiting approach.  I think with the stability of the lesion over the last 15 to 20 years, deferring ongoing imaging is very reasonable.  Follow-up with urology as needed   Redell Burnet, MD 10/31/2023  [facility] [address] [place], [place] [address] [phone]

## 2023-11-01 ENCOUNTER — Ambulatory Visit: Payer: Medicare PPO | Admitting: Pulmonary Disease

## 2023-11-01 ENCOUNTER — Encounter: Payer: Self-pay | Admitting: Pulmonary Disease

## 2023-11-01 ENCOUNTER — Telehealth: Payer: Self-pay

## 2023-11-01 ENCOUNTER — Other Ambulatory Visit (HOSPITAL_COMMUNITY): Payer: Self-pay

## 2023-11-01 VITALS — BP 122/78 | HR 67 | Temp 97.1°F | Ht 63.0 in | Wt 123.4 lb

## 2023-11-01 DIAGNOSIS — R059 Cough, unspecified: Secondary | ICD-10-CM | POA: Diagnosis not present

## 2023-11-01 DIAGNOSIS — J339 Nasal polyp, unspecified: Secondary | ICD-10-CM | POA: Diagnosis not present

## 2023-11-01 DIAGNOSIS — J4520 Mild intermittent asthma, uncomplicated: Secondary | ICD-10-CM | POA: Diagnosis not present

## 2023-11-01 DIAGNOSIS — K219 Gastro-esophageal reflux disease without esophagitis: Secondary | ICD-10-CM

## 2023-11-01 DIAGNOSIS — R9389 Abnormal findings on diagnostic imaging of other specified body structures: Secondary | ICD-10-CM

## 2023-11-01 LAB — NITRIC OXIDE: Nitric Oxide: 20

## 2023-11-01 MED ORDER — AIRSUPRA 90-80 MCG/ACT IN AERO: 2.0000 | INHALATION_SPRAY | RESPIRATORY_TRACT | 2 refills | Status: DC | PRN

## 2023-11-01 NOTE — Progress Notes (Signed)
 Subjective:    Patient ID: Joan Ritter, female    DOB: 1946-08-30, 78 y.o.   MRN: [id number]  Patient Care Team: Glendia Shad, MD as PCP - General (Internal Medicine)  Chief Complaint  Patient presents with   Consult    Abnormal CT chest. No SOB or wheezing. Dry cough.     BACKGROUND: Patient is a 78 year old lifelong never smoker who presents for evaluation of an abnormal chest CT and seasonal dry cough.  She is kindly referred by Dr. Shad Glendia.   HPI Discussed the use of AI scribe software for clinical note transcription with the patient, who gave verbal consent to proceed.  History of Present Illness   Joan Ritter, with a history of gastroenteritis and possible pneumonia, presents for evaluation of an abnormal CT scan and a persistent cough. The cough started approximately two to three months ago and appears to be seasonal. The patient denies any history of asthma or smoking.  Cough is nonproductive.  There is no hemoptysis.  In June, the patient experienced an episode of vomiting and diarrhea, initially suspected to be food poisoning. During a physical in November, the possibility of a potential pneumonia was raised. A follow-up X-ray and CT scan were performed, but the patient has not had a consultation since these tests.  Chest x-ray was obtained on 12 September 2023 and showed no evidence of acute cardiopulmonary disease but there was coarsened interstitial markings.  High-resolution chest CT was performed on 10 October 2023 and this showed mild diffuse bilateral bronchial wall thickening and mild lobular air trapping on expiratory phase consistent with nonspecific infectious or inflammatory bronchitis and small airways disease.  There was no evidence of pulmonary fibrosis.  The patient also reports a history of nasal cellulitis, which began in early November. Initially presenting as a sore nose, the patient was given antibiotics at an Urgent Care center. Despite additional  antibiotics prescribed by her primary care physician, the patient reports that the nose remains slightly sore to touch and is accompanied by nasal congestion.  The patient also has a history of gastroesophageal reflux disease, which is well-controlled with two medications. She denies any shortness of breath, weight loss, productive cough, leg swelling, sleep disturbances, or joint pains. The cough is described as dry and does not produce any sputum. The patient is scheduled to see a specialist for unrelated back issues and calf pain.  She does not endorse any other symptomatology.  No history of occupational exposure.     Review of Systems A 10 point review of systems was performed and it is as noted above otherwise negative.   Past Medical History:  Diagnosis Date   Allergic rhinitis    seasonal   Elevated TSH    Esophagitis    GERD (gastroesophageal reflux disease)    History of Helicobacter pylori infection 2012   Hypercholesterolemia    Osteopenia    TIA (transient ischemic attack) 2001    Past Surgical History:  Procedure Laterality Date   BREAST BIOPSY Right 2000   neg   BREAST BIOPSY Right 05/18/2017   adipose tissue, coil marker   BREAST EXCISIONAL BIOPSY Right 1970   NEG   COLONOSCOPY  2005   normal    ESOPHAGOGASTRODUODENOSCOPY  2012   normal    HEMORRHOID SURGERY  1980s    Patient Active Problem List   Diagnosis Date Noted   Cellulitis of nasal tip 09/16/2023   Abnormal CT scan 09/12/2023   Vaginal irritation 01/06/2023  Leg pain 08/14/2022   Abnormal mammogram 08/08/2020   Right knee pain 01/11/2019   Left carotid bruit 02/24/2018   Suprapubic pressure 11/16/2017   Back pain 05/09/2017   Breast nodule 05/09/2017   Knee pain, bilateral 04/20/2015   Health care maintenance 04/20/2015   Medicare annual wellness visit, subsequent 10/09/2014   Encounter for screening colonoscopy 07/07/2014   Pre-op evaluation 09/07/2013   History of TIA (transient  ischemic attack) 09/08/2012   GERD (gastroesophageal reflux disease) 09/08/2012   Hypercholesterolemia 09/08/2012   Osteopenia 09/08/2012   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    Family History  Problem Relation Age of Onset   Lymphoma Father    CVA Mother        h/o cerebral hemorrhage   Brain cancer Brother    Cardiomyopathy Sister        enlarged heart, died age 43 - sudden death   Breast cancer Maternal Aunt        x2   Colon cancer Neg Hx     Social History   Tobacco Use   Smoking status: Never   Smokeless tobacco: Never  Substance Use Topics   Alcohol use: No    Alcohol/week: 0.0 standard drinks of alcohol    Allergies  Allergen Reactions   Gabapentin Other (See Comments)   No Known Drug Allergy     Current Meds  Medication Sig   Acetaminophen  (TYLENOL  ARTHRITIS PAIN PO) Take by mouth 2 (two) times daily.   acetaminophen  (TYLENOL ) 500 MG tablet Take 1,000 mg by mouth every 6 (six) hours as needed.   acyclovir  (ZOVIRAX ) 800 MG tablet TAKE 1 TABLET (800 MG TOTAL) BY MOUTH AS DIRECTED.   Albuterol -Budesonide (AIRSUPRA ) 90-80 MCG/ACT AERO Inhale 2 puffs into the lungs every 4 (four) hours as needed (cough, sob, wheezing).   aspirin  81 MG tablet Take 81 mg by mouth daily.   Calcium  Carbonate-Vit D-Min 600-400 MG-UNIT TABS Take 1 tablet by mouth 2 (two) times daily.   cetirizine (ZYRTEC) 10 MG tablet Take 10 mg by mouth daily as needed.    Cholecalciferol  (D3-1000 PO) Take 1 tablet by mouth daily.   clopidogrel  (PLAVIX ) 75 MG tablet Take 1 tablet (75 mg total) by mouth daily.   cyanocobalamin  (VITAMIN B12) 1000 MCG tablet Take 1,000 mcg by mouth daily.   estradiol  (ESTRACE ) 0.1 MG/GM vaginal cream Place 1 Applicatorful vaginally at bedtime.   famotidine  (PEPCID ) 20 MG tablet Take 1 tablet (20 mg total) by mouth daily.   fluticasone  (FLONASE ) 50 MCG/ACT nasal spray USE 2 SPRAYS EACH NOSTRIL EVERY DAY   Multiple Vitamin (MULTIVITAMIN) tablet Take 1 tablet by mouth daily.    pantoprazole  (PROTONIX ) 40 MG tablet Take 1 tablet (40 mg total) by mouth daily.   rosuvastatin  (CRESTOR ) 20 MG tablet Take 1 tablet (20 mg total) by mouth daily.    Immunization History  Administered Date(s) Administered   Fluad Quad(high Dose 65+) 08/03/2021, 08/14/2022   Influenza Split 08/18/2014   Influenza, High Dose Seasonal PF 06/28/2017, 07/03/2018, 06/12/2019   Influenza-Unspecified 07/23/2013, 09/01/2015, 07/26/2016, 08/20/2023   Moderna Covid-19 Fall Seasonal Vaccine 28yrs & older 08/17/2023   PFIZER(Purple Top)SARS-COV-2 Vaccination 11/12/2019, 12/03/2019, 08/11/2020   Pneumococcal Conjugate-13 05/01/2014   Pneumococcal Polysaccharide-23 08/21/2017   Rsv, Bivalent, Protein Subunit Rsvpref,pf Marlow) 11/06/2022   Zoster Recombinant(Shingrix) 02/22/2017, 06/28/2017   Zoster, Live 10/23/2005        Objective:     BP 122/78 (BP Location: Right Arm, Cuff Size: Normal)   Pulse 67   Temp ROLLEN)  97.1 F (36.2 C)   Ht 5' 3 (1.6 m)   Wt 123 lb 6.4 oz (56 kg)   SpO2 97%   BMI 21.86 kg/m   SpO2: 97 % O2 Device: None (Room air)  GENERAL: Thin, well-developed woman, no acute distress.  Fully ambulatory, no conversational dyspnea. HEAD: Normocephalic, atraumatic.  EYES: Pupils equal, round, reactive to light.  No scleral icterus.  NOSE: Nasal mucosa erythematous, there is mucosal bogginess.  There is a polyp on the left nares. MOUTH: Multiple fillings, oral mucosa moist.  No thrush pharynx clear NECK: Supple. No thyromegaly. Trachea midline. No JVD.  No adenopathy. PULMONARY: Good air entry bilaterally.  No adventitious sounds. CARDIOVASCULAR: S1 and S2. Regular rate and rhythm.  No rubs, murmurs or gallops heard. ABDOMEN: Benign. MUSCULOSKELETAL: No joint deformity, no clubbing, no edema.  NEUROLOGIC: No overt focal deficit, no gait disturbance, speech is fluent. SKIN: Intact,warm,dry. PSYCH: Behavior normal  Lab Results  Component Value Date   NITRICOXIDE 20  11/01/2023    Assessment & Plan:     ICD-10-CM   1. Cough, unspecified type  R05.9 Nitric oxide     Pulmonary Function Test [facility] Only    2. Intermittent asthma, unspecified asthma severity, unspecified whether complicated  J45.20 Pulmonary Function Test [facility] Only    3. Left nasal polyps  J33.9     4. Abnormal CT of the chest  R93.89     5. Gastroesophageal reflux disease, unspecified whether esophagitis present  K21.9       Orders Placed This Encounter  Procedures   Nitric oxide    Pulmonary Function Test [facility] Only    Standing Status:   Future    Expected Date:   12/02/2023    Expiration Date:   10/31/2024    Full PFT: includes the following: basic spirometry, spirometry pre & post bronchodilator, diffusion capacity (DLCO), lung volumes:   Full PFT    This test can only be performed at:   [facility] ordered this encounter  Medications   Albuterol -Budesonide (AIRSUPRA ) 90-80 MCG/ACT AERO    Sig: Inhale 2 puffs into the lungs every 4 (four) hours as needed (cough, sob, wheezing).    Dispense:  10.7 g    Refill:  2   Discussion:    Asthma Suspect asthma based on seasonal cough, nasal polyps, and airway inflammation seen in CT scan. No high inflammation detected today. Dry, non-productive cough likely due to airway spasm. Emphasized managing inflammation to prevent irreversible airway damage and persistent dyspnea. - Prescribe inhaler for use as needed - Instruct on proper inhaler use and mouth rinsing post-use - Order pulmonary function tests - Schedule follow-up in three months to reassess symptoms and treatment response  Nasal Polyps Nasal polyps observed during physical examination, potentially related to asthma. Currently asymptomatic. Discussed possible ENT referral if symptoms worsen. - Observe nasal polyps - Recommend ENT referral if symptoms worsen  Nasal Cellulitis Residual soreness and nasal congestion since November post-antibiotic treatment for  nasal cellulitis. Monitoring for resolution and considering further evaluation if symptoms persist. - Monitor for symptom resolution - Consider further evaluation if symptoms persist  Gastroesophageal Reflux Disease (GERD) GERD managed with two medications. No current symptoms of reflux or heartburn. - Continue current GERD medications  Follow-up - Schedule follow-up appointment in three months.      Advised if symptoms do not improve or worsen, to please contact office for sooner follow up or seek emergency care.    I spent  47 minutes of dedicated to the care of this patient on the date of this encounter to include pre-visit review of records, face-to-face time with the patient discussing conditions above, post visit ordering of testing, clinical documentation with the electronic health record, making appropriate referrals as documented, and communicating necessary findings to members of the patients care team.   C. Leita Sanders, MD Advanced Bronchoscopy PCCM [facility]    *This note was dictated using voice recognition software/Dragon.  Despite best efforts to proofread, errors can occur which can change the meaning. Any transcriptional errors that result from this process are unintentional and may not be fully corrected at the time of dictation.

## 2023-11-01 NOTE — Patient Instructions (Signed)
 VISIT SUMMARY:  [name], you visited us  today to discuss your persistent cough and review the results of your recent CT scan. We also addressed your nasal cellulitis and gastroesophageal reflux disease (GERD).  YOUR PLAN:  -ASTHMA SUSPECTED: Asthma is a condition where your airways become inflamed and narrow, causing difficulty in breathing. Your seasonal cough and nasal polyps suggest asthma. We have prescribed an inhaler for you to use as needed and instructed you on its proper use. We also ordered pulmonary function tests to better understand your lung function. Please follow up in three months to reassess your symptoms and treatment response.  -NASAL POLYPS: Nasal polyps are non-cancerous growths in the lining of your nasal passages or sinuses. They are often associated with asthma. Currently, your nasal polyps are not causing symptoms, but we will monitor them and may refer you to an ENT specialist if they worsen.  -NASAL CELLULITIS: Nasal cellulitis is a bacterial skin infection around your nose. Despite antibiotic treatment, you still have some soreness and congestion. We will continue to monitor your symptoms and consider further evaluation if they do not improve.  -GASTROESOPHAGEAL REFLUX DISEASE (GERD): GERD is a digestive disorder where stomach acid frequently flows back into the tube connecting your mouth and stomach. Your GERD is well-controlled with your current medications, and you are not experiencing any symptoms. Please continue your current medications.  INSTRUCTIONS:  Please schedule a follow-up appointment in three months to reassess your asthma symptoms and treatment response. Additionally, complete the pulmonary function tests as ordered.

## 2023-11-01 NOTE — Telephone Encounter (Signed)
 Pharmacy Patient Advocate Encounter   Received notification from CoverMyMeds that prior authorization for Airsupra  90-80MCG/ACT aerosol is required/requested.   Insurance verification completed.   The patient is insured through [facility] .   KEY [id number]  Prior Authorization form/request asks a question that requires your assistance. Please see the question below and advise accordingly.

## 2023-11-02 NOTE — Telephone Encounter (Signed)
 Pt seen 11/01/23. Will close encounter

## 2023-11-03 NOTE — Telephone Encounter (Signed)
 Routing to Dr. Reece Agar for review.

## 2023-11-05 DIAGNOSIS — M48062 Spinal stenosis, lumbar region with neurogenic claudication: Secondary | ICD-10-CM | POA: Diagnosis not present

## 2023-11-05 DIAGNOSIS — M51361 Other intervertebral disc degeneration, lumbar region with lower extremity pain only: Secondary | ICD-10-CM | POA: Diagnosis not present

## 2023-11-05 DIAGNOSIS — M5416 Radiculopathy, lumbar region: Secondary | ICD-10-CM | POA: Diagnosis not present

## 2023-11-05 MED ORDER — ALBUTEROL SULFATE HFA 108 (90 BASE) MCG/ACT IN AERS: 2.0000 | INHALATION_SPRAY | Freq: Four times a day (QID) | RESPIRATORY_TRACT | 2 refills | Status: DC | PRN

## 2023-11-05 NOTE — Telephone Encounter (Signed)
 I have notified the patient and sent in the Albuterol inhaler.  Nothing further needed.

## 2023-11-05 NOTE — Telephone Encounter (Signed)
 We can try albuterol 2 puffs every 4-6h as needed wheezing/cough/shortness of breath.

## 2023-11-14 ENCOUNTER — Other Ambulatory Visit: Payer: Self-pay | Admitting: Internal Medicine

## 2023-11-26 ENCOUNTER — Other Ambulatory Visit: Payer: Self-pay | Admitting: Orthopedic Surgery

## 2023-11-26 DIAGNOSIS — M5416 Radiculopathy, lumbar region: Secondary | ICD-10-CM

## 2023-11-26 DIAGNOSIS — M4807 Spinal stenosis, lumbosacral region: Secondary | ICD-10-CM

## 2023-11-27 ENCOUNTER — Encounter: Payer: Self-pay | Admitting: Orthopedic Surgery

## 2023-12-07 ENCOUNTER — Other Ambulatory Visit: Payer: Self-pay | Admitting: Internal Medicine

## 2023-12-12 ENCOUNTER — Ambulatory Visit
Admission: RE | Admit: 2023-12-12 | Discharge: 2023-12-12 | Discharge status: Home / Self Care | Payer: Medicare PPO | Source: Ambulatory Visit | Attending: Orthopedic Surgery | Admitting: Orthopedic Surgery

## 2023-12-12 DIAGNOSIS — M5416 Radiculopathy, lumbar region: Secondary | ICD-10-CM

## 2023-12-12 DIAGNOSIS — M5126 Other intervertebral disc displacement, lumbar region: Secondary | ICD-10-CM | POA: Diagnosis not present

## 2023-12-12 DIAGNOSIS — M4807 Spinal stenosis, lumbosacral region: Secondary | ICD-10-CM

## 2023-12-12 DIAGNOSIS — M4726 Other spondylosis with radiculopathy, lumbar region: Secondary | ICD-10-CM | POA: Diagnosis not present

## 2023-12-13 ENCOUNTER — Other Ambulatory Visit: Payer: Self-pay | Admitting: Internal Medicine

## 2023-12-15 ENCOUNTER — Other Ambulatory Visit: Payer: Medicare PPO

## 2023-12-16 ENCOUNTER — Encounter: Payer: Self-pay | Admitting: Pulmonary Disease

## 2023-12-17 ENCOUNTER — Other Ambulatory Visit: Payer: Self-pay | Admitting: Internal Medicine

## 2023-12-17 DIAGNOSIS — J340 Abscess, furuncle and carbuncle of nose: Secondary | ICD-10-CM | POA: Diagnosis not present

## 2023-12-18 NOTE — Telephone Encounter (Signed)
 PFT will be scheduled right before her appt on 01/30/24 waiting for April PFT appts

## 2023-12-18 NOTE — Telephone Encounter (Signed)
 Rx ok'd for acyclovir.

## 2024-01-09 DIAGNOSIS — J340 Abscess, furuncle and carbuncle of nose: Secondary | ICD-10-CM | POA: Diagnosis not present

## 2024-01-10 ENCOUNTER — Other Ambulatory Visit: Payer: Self-pay | Admitting: Internal Medicine

## 2024-01-15 DIAGNOSIS — G8929 Other chronic pain: Secondary | ICD-10-CM | POA: Diagnosis not present

## 2024-01-15 DIAGNOSIS — M5442 Lumbago with sciatica, left side: Secondary | ICD-10-CM | POA: Diagnosis not present

## 2024-01-15 DIAGNOSIS — M5416 Radiculopathy, lumbar region: Secondary | ICD-10-CM | POA: Diagnosis not present

## 2024-01-15 DIAGNOSIS — M5441 Lumbago with sciatica, right side: Secondary | ICD-10-CM | POA: Diagnosis not present

## 2024-01-17 DIAGNOSIS — M5416 Radiculopathy, lumbar region: Secondary | ICD-10-CM | POA: Diagnosis not present

## 2024-01-22 ENCOUNTER — Telehealth: Payer: Self-pay | Admitting: Pulmonary Disease

## 2024-01-22 NOTE — Telephone Encounter (Signed)
 Copied from CRM [id number]. Topic: Clinical - Request for Lab/Test Order >> Jan 22, 2024 10:12 AM Para March B wrote: Reason for CRM: Patient called to cancel appt scheduled on 4/09. Stated she was suppose to have a PFT before that appt and the office never called her and she's unsure if she needs the test. Would like a call from Dr Jayme Cloud office to confirm if the test is necessary (not having an issues at this point).

## 2024-01-22 NOTE — Telephone Encounter (Signed)
 Per Melissa, appts have been scheduled.  Nothing further needed.

## 2024-01-22 NOTE — Telephone Encounter (Signed)
 PFT scheduling has been backed up.  I would like to confirm the severity of her asthma and this would be good to have.  We can schedule the PFT and reschedule her follow-up after the PFT.

## 2024-01-30 ENCOUNTER — Ambulatory Visit: Payer: Medicare PPO | Admitting: Pulmonary Disease

## 2024-01-31 DIAGNOSIS — M5416 Radiculopathy, lumbar region: Secondary | ICD-10-CM | POA: Diagnosis not present

## 2024-01-31 DIAGNOSIS — M5442 Lumbago with sciatica, left side: Secondary | ICD-10-CM | POA: Diagnosis not present

## 2024-01-31 DIAGNOSIS — M5441 Lumbago with sciatica, right side: Secondary | ICD-10-CM | POA: Diagnosis not present

## 2024-01-31 DIAGNOSIS — G8929 Other chronic pain: Secondary | ICD-10-CM | POA: Diagnosis not present

## 2024-02-01 DIAGNOSIS — M5416 Radiculopathy, lumbar region: Secondary | ICD-10-CM | POA: Diagnosis not present

## 2024-02-18 DIAGNOSIS — M5441 Lumbago with sciatica, right side: Secondary | ICD-10-CM | POA: Diagnosis not present

## 2024-02-18 DIAGNOSIS — M5442 Lumbago with sciatica, left side: Secondary | ICD-10-CM | POA: Diagnosis not present

## 2024-02-18 DIAGNOSIS — M5416 Radiculopathy, lumbar region: Secondary | ICD-10-CM | POA: Diagnosis not present

## 2024-02-18 DIAGNOSIS — G8929 Other chronic pain: Secondary | ICD-10-CM | POA: Diagnosis not present

## 2024-02-22 ENCOUNTER — Other Ambulatory Visit: Payer: Self-pay

## 2024-02-22 DIAGNOSIS — R059 Cough, unspecified: Secondary | ICD-10-CM

## 2024-02-22 DIAGNOSIS — J4520 Mild intermittent asthma, uncomplicated: Secondary | ICD-10-CM

## 2024-02-26 ENCOUNTER — Ambulatory Visit: Admitting: Pulmonary Disease

## 2024-02-26 ENCOUNTER — Encounter: Payer: Self-pay | Admitting: Pulmonary Disease

## 2024-02-26 VITALS — BP 124/78 | HR 75 | Temp 98.2°F | Ht 62.0 in | Wt 122.8 lb

## 2024-02-26 DIAGNOSIS — J4520 Mild intermittent asthma, uncomplicated: Secondary | ICD-10-CM | POA: Diagnosis not present

## 2024-02-26 DIAGNOSIS — R059 Cough, unspecified: Secondary | ICD-10-CM

## 2024-02-26 DIAGNOSIS — J339 Nasal polyp, unspecified: Secondary | ICD-10-CM | POA: Diagnosis not present

## 2024-02-26 LAB — PULMONARY FUNCTION TEST
DL/VA % pred: 98 %
DL/VA: 4.15 ml/min/mmHg/L
DLCO unc % pred: 96 %
DLCO unc: 16.8 ml/min/mmHg
FEF 25-75 Post: 2.57 L/s
FEF 25-75 Pre: 2.16 L/s
FEF2575-%Change-Post: 19 %
FEF2575-%Pred-Post: 184 %
FEF2575-%Pred-Pre: 154 %
FEV1-%Change-Post: 5 %
FEV1-%Pred-Post: 121 %
FEV1-%Pred-Pre: 114 %
FEV1-Post: 2.21 L
FEV1-Pre: 2.1 L
FEV1FVC-%Change-Post: 6 %
FEV1FVC-%Pred-Pre: 107 %
FEV6-%Change-Post: 0 %
FEV6-%Pred-Post: 111 %
FEV6-%Pred-Pre: 111 %
FEV6-Post: 2.59 L
FEV6-Pre: 2.59 L
FEV6FVC-%Change-Post: 0 %
FEV6FVC-%Pred-Post: 105 %
FEV6FVC-%Pred-Pre: 104 %
FVC-%Change-Post: -1 %
FVC-%Pred-Post: 105 %
FVC-%Pred-Pre: 107 %
FVC-Post: 2.6 L
FVC-Pre: 2.63 L
Post FEV1/FVC ratio: 85 %
Post FEV6/FVC ratio: 100 %
Pre FEV1/FVC ratio: 80 %
Pre FEV6/FVC Ratio: 99 %
RV % pred: 94 %
RV: 2.13 L
TLC % pred: 98 %
TLC: 4.67 L

## 2024-02-26 NOTE — Patient Instructions (Signed)
 Full PFT completed today ? ?

## 2024-02-26 NOTE — Progress Notes (Signed)
 Full PFT completed today ? ?

## 2024-02-26 NOTE — Patient Instructions (Signed)
 VISIT SUMMARY:  Today, we discussed your follow-up regarding your cough. You mentioned that you have not used your inhaler as your insurance denied it, but you also have not felt the need for it. You reported no shortness of breath or significant coughing since your last visit, although you occasionally experience a 'trickle' but do not consider it a cough. We also reviewed your history of nasal polyps and nasal cellulitis.  YOUR PLAN:  -NASAL POLYPS: Nasal polyps are non-cancerous growths in the lining of your nasal passages or sinuses that can sometimes lead to breathing issues. Currently, you have no shortness of breath or significant cough, and your lung function tests are normal. The previous suspicion of asthma was likely a reaction to a viral infection, which has resolved. You do not require a maintenance inhaler at this time. Please monitor for any recurrence of symptoms such as shortness of breath or cough and report them for further evaluation. Additionally, stay hydrated to prevent throat irritation from throat clearing.  -NASAL CELLULITIS: Nasal cellulitis is an infection of the skin around your nose. You have a history of this condition and occasionally use a topical treatment if you feel a sore in your nose, but you have not needed it recently. Please use the topical treatment as needed if symptoms recur.  Follow-up here will be on an as-needed basis.

## 2024-02-26 NOTE — Progress Notes (Unsigned)
 Subjective:    Patient ID: Joan Ritter, female    DOB: May 24, 1946, 78 y.o.   MRN: [id number]  Patient Care Team: Dellar Fenton, MD as PCP - General (Internal Medicine)  Chief Complaint  Patient presents with  . Follow-up    No SOB, cough, or wheezing.    BACKGROUND/INTERVAL:  HPI    Review of Systems A 10 point review of systems was performed and it is as noted above otherwise negative.   Patient Active Problem List   Diagnosis Date Noted  . Cellulitis of nasal tip 09/16/2023  . Abnormal CT scan 09/12/2023  . Vaginal irritation 01/06/2023  . Leg pain 08/14/2022  . Abnormal mammogram 08/08/2020  . Right knee pain 01/11/2019  . Left carotid bruit 02/24/2018  . Suprapubic pressure 11/16/2017  . Back pain 05/09/2017  . Breast nodule 05/09/2017  . Knee pain, bilateral 04/20/2015  . Health care maintenance 04/20/2015  . Medicare annual wellness visit, subsequent 10/09/2014  . Encounter for screening colonoscopy 07/07/2014  . Pre-op evaluation 09/07/2013  . History of TIA (transient ischemic attack) 09/08/2012  . GERD (gastroesophageal reflux disease) 09/08/2012  . Hypercholesterolemia 09/08/2012  . Osteopenia 09/08/2012  . 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 Smoking status: Never  . Smokeless tobacco: Never  Substance Use Topics  . Alcohol use: No    Alcohol/week: 0.0 standard drinks of alcohol    Allergies  Allergen Reactions  . Gabapentin Other (See Comments)  . No Known Drug Allergy     Current Meds  Medication Sig  . acyclovir  (ZOVIRAX ) 800 MG tablet TAKE 1 TABLET AS DIRECTED.  Aaron Aas aspirin 81 MG tablet Take 81 mg by mouth daily.  . Calcium  Carbonate-Vit D-Min 600-400 MG-UNIT TABS Take 1 tablet by mouth 2 (two) times daily.  . cetirizine (ZYRTEC) 10 MG tablet Take 10 mg by mouth daily as needed.   . Cholecalciferol (D3-1000 PO) Take 1 tablet by mouth daily.  . clopidogrel  (PLAVIX ) 75 MG tablet Take 1 tablet (75 mg total)  by mouth daily.  . cyanocobalamin (VITAMIN B12) 1000 MCG tablet Take 1,000 mcg by mouth daily.  Aaron Aas estradiol (ESTRACE) 0.1 MG/GM vaginal cream Place 1 Applicatorful vaginally at bedtime.  . famotidine  (PEPCID ) 20 MG tablet Take 1 tablet (20 mg total) by mouth daily.  . fluticasone (FLONASE) 50 MCG/ACT nasal spray USE 2 SPRAYS EACH NOSTRIL EVERY DAY  . Multiple Vitamin (MULTIVITAMIN) tablet Take 1 tablet by mouth daily.  . pantoprazole  (PROTONIX ) 40 MG tablet Take 1 tablet (40 mg total) by mouth daily.  . rosuvastatin  (CRESTOR ) 20 MG tablet Take 1 tablet (20 mg total) by mouth daily.    Immunization History  Administered Date(s) Administered  . Fluad Quad(high Dose 65+) 08/03/2021, 08/14/2022  . Influenza Split 08/18/2014  . Influenza, High Dose Seasonal PF 06/28/2017, 07/03/2018, 06/12/2019  . Influenza-Unspecified 07/23/2013, 09/01/2015, 07/26/2016, 08/20/2023  . Moderna Covid-19 Fall Seasonal Vaccine 12yrs & older 08/17/2023  . PFIZER(Purple Top)SARS-COV-2 Vaccination 11/12/2019, 12/03/2019, 08/11/2020  . Pneumococcal Conjugate-13 05/01/2014  . Pneumococcal Polysaccharide-23 08/21/2017  . Rsv, Bivalent, Protein Subunit Rsvpref,pf Pattricia Bores) 11/06/2022  . Zoster Recombinant(Shingrix) 02/22/2017, 06/28/2017  . Zoster, Live 10/23/2005        Objective:     BP 124/78 (BP Location: Left Arm, Cuff Size: Normal)   Pulse 75   Temp 98.2 F (36.8 C)   Ht 5\' 2"  (1.575 m)   Wt 122 lb 12.8 oz (55.7 kg)   SpO2 97%  BMI 22.46 kg/m   SpO2: 97 % O2 Device: None (Room air)  GENERAL: HEAD: Normocephalic, atraumatic.  EYES: Pupils equal, round, reactive to light.  No scleral icterus.  MOUTH:  NECK: Supple. No thyromegaly. Trachea midline. No JVD.  No adenopathy. PULMONARY: Good air entry bilaterally.  No adventitious sounds. CARDIOVASCULAR: S1 and S2. Regular rate and rhythm.  ABDOMEN: MUSCULOSKELETAL: No joint deformity, no clubbing, no edema.  NEUROLOGIC:  SKIN:  Intact,warm,dry. PSYCH:        Assessment & Plan:   No diagnosis found.  No orders of the defined types were placed in this encounter.   No orders of the defined types were placed in this encounter.      Advised if symptoms do not improve or worsen, to please contact office for sooner follow up or seek emergency care.    I spent xxx minutes of dedicated to the care of this patient on the date of this encounter to include pre-visit review of records, face-to-face time with the patient discussing conditions above, post visit ordering of testing, clinical documentation with the electronic health record, making appropriate referrals as documented, and communicating necessary findings to members of the patients care team.     C. Chloe Counter, MD Advanced Bronchoscopy PCCM  Pulmonary-    *This note was generated using voice recognition software/Dragon and/or AI transcription program.  Despite best efforts to proofread, errors can occur which can change the meaning. Any transcriptional errors that result from this process are unintentional and may not be fully corrected at the time of dictation.

## 2024-02-27 ENCOUNTER — Encounter: Payer: Self-pay | Admitting: Pulmonary Disease

## 2024-03-03 ENCOUNTER — Other Ambulatory Visit: Payer: Self-pay | Admitting: Internal Medicine

## 2024-03-06 ENCOUNTER — Other Ambulatory Visit: Payer: Self-pay

## 2024-03-06 DIAGNOSIS — E7800 Pure hypercholesterolemia, unspecified: Secondary | ICD-10-CM

## 2024-03-06 DIAGNOSIS — N1831 Chronic kidney disease, stage 3a: Secondary | ICD-10-CM

## 2024-03-10 ENCOUNTER — Other Ambulatory Visit: Payer: Medicare PPO

## 2024-03-10 DIAGNOSIS — N1831 Chronic kidney disease, stage 3a: Secondary | ICD-10-CM

## 2024-03-10 DIAGNOSIS — E7800 Pure hypercholesterolemia, unspecified: Secondary | ICD-10-CM | POA: Diagnosis not present

## 2024-03-10 LAB — BASIC METABOLIC PANEL WITH GFR
BUN: 10 mg/dL (ref 6–23)
CO2: 29 meq/L (ref 19–32)
Calcium: 8.9 mg/dL (ref 8.4–10.5)
Chloride: 107 meq/L (ref 96–112)
Creatinine, Ser: 0.8 mg/dL (ref 0.40–1.20)
GFR: 70.64 mL/min (ref >60.00)
Glucose, Bld: 98 mg/dL (ref 70–99)
Potassium: 3.9 meq/L (ref 3.5–5.1)
Sodium: 144 meq/L (ref 135–145)

## 2024-03-10 LAB — LIPID PANEL
Cholesterol: 143 mg/dL (ref 0–200)
HDL: 62.5 mg/dL (ref >39.00)
LDL Cholesterol: 64 mg/dL (ref 0–99)
NonHDL: 80.58
Total CHOL/HDL Ratio: 2
Triglycerides: 82 mg/dL (ref 0.0–149.0)
VLDL: 16.4 mg/dL (ref 0.0–40.0)

## 2024-03-10 LAB — HEPATIC FUNCTION PANEL
ALT: 20 U/L (ref 0–35)
AST: 21 U/L (ref 0–37)
Albumin: 3.7 g/dL (ref 3.5–5.2)
Alkaline Phosphatase: 45 U/L (ref 39–117)
Bilirubin, Direct: 0.1 mg/dL (ref 0.0–0.3)
Total Bilirubin: 0.6 mg/dL (ref 0.2–1.2)
Total Protein: 6 g/dL (ref 6.0–8.3)

## 2024-03-11 ENCOUNTER — Ambulatory Visit: Payer: Self-pay | Admitting: Internal Medicine

## 2024-03-12 ENCOUNTER — Encounter: Payer: Self-pay | Admitting: Internal Medicine

## 2024-03-12 ENCOUNTER — Ambulatory Visit (INDEPENDENT_AMBULATORY_CARE_PROVIDER_SITE_OTHER): Payer: Medicare PPO | Admitting: Internal Medicine

## 2024-03-12 VITALS — BP 110/72 | HR 82 | Temp 98.0°F | Resp 16 | Ht 62.0 in | Wt 123.0 lb

## 2024-03-12 DIAGNOSIS — N1831 Chronic kidney disease, stage 3a: Secondary | ICD-10-CM

## 2024-03-12 DIAGNOSIS — K219 Gastro-esophageal reflux disease without esophagitis: Secondary | ICD-10-CM

## 2024-03-12 DIAGNOSIS — Z1211 Encounter for screening for malignant neoplasm of colon: Secondary | ICD-10-CM | POA: Diagnosis not present

## 2024-03-12 DIAGNOSIS — E7800 Pure hypercholesterolemia, unspecified: Secondary | ICD-10-CM | POA: Diagnosis not present

## 2024-03-12 DIAGNOSIS — D692 Other nonthrombocytopenic purpura: Secondary | ICD-10-CM | POA: Insufficient documentation

## 2024-03-12 DIAGNOSIS — Z0000 Encounter for general adult medical examination without abnormal findings: Secondary | ICD-10-CM

## 2024-03-12 DIAGNOSIS — Z1231 Encounter for screening mammogram for malignant neoplasm of breast: Secondary | ICD-10-CM | POA: Diagnosis not present

## 2024-03-12 DIAGNOSIS — M5441 Lumbago with sciatica, right side: Secondary | ICD-10-CM | POA: Diagnosis not present

## 2024-03-12 DIAGNOSIS — D179 Benign lipomatous neoplasm, unspecified: Secondary | ICD-10-CM

## 2024-03-12 DIAGNOSIS — Z8673 Personal history of transient ischemic attack (TIA), and cerebral infarction without residual deficits: Secondary | ICD-10-CM

## 2024-03-12 MED ORDER — FAMOTIDINE 20 MG PO TABS: 20.0000 mg | ORAL_TABLET | Freq: Every day | ORAL | 3 refills | Status: AC

## 2024-03-12 MED ORDER — ROSUVASTATIN CALCIUM 20 MG PO TABS: 20.0000 mg | ORAL_TABLET | Freq: Every day | ORAL | 3 refills | Status: DC

## 2024-03-12 MED ORDER — PANTOPRAZOLE SODIUM 40 MG PO TBEC: 40.0000 mg | DELAYED_RELEASE_TABLET | Freq: Every day | ORAL | 3 refills | Status: AC

## 2024-03-12 NOTE — Assessment & Plan Note (Signed)
 On crestor  20mg  q day.  Continue low cholesterol diet and exercise.  Follow lipid panel and liver function tests. Discussed recent labs. No changes in medication today.

## 2024-03-12 NOTE — Assessment & Plan Note (Addendum)
 Physical today 03/12/24.  Mammogram 06/18/23 - Birads II.  Colonoscopy 07/2014 - normal.  Recommended f/u in 10 years.  Order placed for referral.

## 2024-03-12 NOTE — Assessment & Plan Note (Signed)
Continue plavix and crestor.  ?

## 2024-03-12 NOTE — Progress Notes (Addendum)
 Subjective:    Patient ID: Joan Ritter, female    DOB: 17-May-1946, 78 y.o.   MRN: [id number]  Patient here for  Chief Complaint  Patient presents with   Annual Exam    HPI Here for a physical exam. Had f/u with pulmonary 02/26/24 - follow up cough. Cough resolved. No sob. Lung function tests - normal. Had f/u Dr [name] Ammons 02/18/24 - f/u low back pain with right lumbar radiculitis. S/p epidural steroid injection x 2 with some noted improvement. She reports persistent increased pain - pain into right leg. Limiting her activity. Discussed referral to [facility] - given has tried medication, PT and epidural injections. Saw urology 10/31/23 - f/u renal lesion. Recommended no further imaging. No chest pain or sob reported. No cough or congestion. No abdominal pain or bowel change reported. Due colonoscopy.    Past Medical History:  Diagnosis Date   Allergic rhinitis    seasonal   Elevated TSH    Esophagitis    GERD (gastroesophageal reflux disease)    History of Helicobacter pylori infection 2012   Hypercholesterolemia    Osteopenia    TIA (transient ischemic attack) 2001   Past Surgical History:  Procedure Laterality Date   BREAST BIOPSY Right 2000   neg   BREAST BIOPSY Right 05/18/2017   adipose tissue, coil marker   BREAST EXCISIONAL BIOPSY Right 1970   NEG   COLONOSCOPY  2005   normal    ESOPHAGOGASTRODUODENOSCOPY  2012   normal    HEMORRHOID SURGERY  1980s   Family History  Problem Relation Age of Onset   Lymphoma Father    CVA Mother        h/o cerebral hemorrhage   Brain cancer Brother    Cardiomyopathy Sister        enlarged heart, died age 70 - sudden death   Breast cancer Maternal Aunt        x2   Colon cancer Neg Hx    Social History   Socioeconomic History   Marital status: Married    Spouse name: Not on file   Number of children: 2   Years of education: Not on file   Highest education level: Bachelor's degree (e.g., BA, AB, BS)  Occupational History   Not on  file  Tobacco Use   Smoking status: Never   Smokeless tobacco: Never  Vaping Use   Vaping status: Never Used  Substance and Sexual Activity   Alcohol use: No    Alcohol/week: 0.0 standard drinks of alcohol   Drug use: No   Sexual activity: Yes    Birth control/protection: Post-menopausal, Abstinence  Other Topics Concern   Not on file  Social History Narrative   Not on file   Social Drivers of Health   Financial Resource Strain: Low Risk  (03/05/2024)   Overall Financial Resource Strain (CARDIA)    Difficulty of Paying Living Expenses: Not hard at all  Food Insecurity: No Food Insecurity (03/05/2024)   Hunger Vital Sign    Worried About Running Out of Food in the Last Year: Never true    Ran Out of Food in the Last Year: Never true  Transportation Needs: No Transportation Needs (03/05/2024)   PRAPARE - Administrator, Civil Service (Medical): No    Lack of Transportation (Non-Medical): No  Physical Activity: Insufficiently Active (03/05/2024)   Exercise Vital Sign    Days of Exercise per Week: 3 days    Minutes of Exercise per  Session: 30 min  Stress: No Stress Concern Present (03/05/2024)   [employer] of Occupational Health - Occupational Stress Questionnaire    Feeling of Stress : Not at all  Social Connections: Socially Integrated (03/05/2024)   Social Connection and Isolation Panel [NHANES]    Frequency of Communication with Friends and Family: More than three times a week    Frequency of Social Gatherings with Friends and Family: More than three times a week    Attends Religious Services: More than 4 times per year    Active Member of [employer] or Organizations: Yes    Attends Engineer, structural: More than 4 times per year    Marital Status: Married     Review of Systems  Constitutional:  Negative for appetite change and unexpected weight change.  HENT:  Negative for congestion, sinus pressure and sore throat.   Eyes:  Negative for pain and  visual disturbance.  Respiratory:  Negative for cough, chest tightness and shortness of breath.   Cardiovascular:  Negative for chest pain, palpitations and leg swelling.  Gastrointestinal:  Negative for abdominal pain, diarrhea, nausea and vomiting.  Genitourinary:  Negative for difficulty urinating and dysuria.  Musculoskeletal:  Positive for back pain. Negative for joint swelling and myalgias.  Skin:  Negative for color change and rash.  Neurological:  Negative for dizziness and headaches.  Hematological:  Negative for adenopathy. Does not bruise/bleed easily.  Psychiatric/Behavioral:  Negative for agitation and dysphoric mood.        Objective:     BP 110/72   Pulse 82   Temp 98 F (36.7 C)   Resp 16   Ht 5\' 2"  (1.575 m)   Wt 123 lb (55.8 kg)   SpO2 99%   BMI 22.50 kg/m  Wt Readings from Last 3 Encounters:  03/12/24 123 lb (55.8 kg)  02/26/24 122 lb 12.8 oz (55.7 kg)  02/26/24 122 lb 12.8 oz (55.7 kg)    Physical Exam Vitals reviewed.  Constitutional:      General: She is not in acute distress.    Appearance: Normal appearance. She is well-developed.  HENT:     Head: Normocephalic and atraumatic.     Right Ear: External ear normal.     Left Ear: External ear normal.     Mouth/Throat:     Pharynx: No oropharyngeal exudate or posterior oropharyngeal erythema.  Eyes:     General: No scleral icterus.       Right eye: No discharge.        Left eye: No discharge.     Conjunctiva/sclera: Conjunctivae normal.  Neck:     Thyroid : No thyromegaly.  Cardiovascular:     Rate and Rhythm: Normal rate and regular rhythm.  Pulmonary:     Effort: No tachypnea, accessory muscle usage or respiratory distress.     Breath sounds: Normal breath sounds. No decreased breath sounds or wheezing.  Chest:  Breasts:    Right: No inverted nipple, mass, nipple discharge or tenderness (no axillary adenopathy).     Left: No inverted nipple, mass, nipple discharge or tenderness (no  axilarry adenopathy).  Abdominal:     General: Bowel sounds are normal.     Palpations: Abdomen is soft.     Tenderness: There is no abdominal tenderness.  Musculoskeletal:        General: No swelling or tenderness.     Cervical back: Neck supple.  Lymphadenopathy:     Cervical: No cervical adenopathy.  Skin:  Findings: No erythema or rash.  Neurological:     Mental Status: She is alert and oriented to person, place, and time.  Psychiatric:        Mood and Affect: Mood normal.        Behavior: Behavior normal.         Outpatient Encounter Medications as of 03/12/2024  Medication Sig   acyclovir  (ZOVIRAX ) 800 MG tablet TAKE 1 TABLET AS DIRECTED.   aspirin 81 MG tablet Take 81 mg by mouth daily.   Calcium  Carbonate-Vit D-Min 600-400 MG-UNIT TABS Take 1 tablet by mouth 2 (two) times daily.   cetirizine (ZYRTEC) 10 MG tablet Take 10 mg by mouth daily as needed.    Cholecalciferol (D3-1000 PO) Take 1 tablet by mouth daily.   clopidogrel  (PLAVIX ) 75 MG tablet Take 1 tablet (75 mg total) by mouth daily.   cyanocobalamin (VITAMIN B12) 1000 MCG tablet Take 1,000 mcg by mouth daily.   estradiol (ESTRACE) 0.1 MG/GM vaginal cream Place 1 Applicatorful vaginally at bedtime.   famotidine  (PEPCID ) 20 MG tablet Take 1 tablet (20 mg total) by mouth daily.   fluticasone (FLONASE) 50 MCG/ACT nasal spray USE 2 SPRAYS EACH NOSTRIL EVERY DAY   Multiple Vitamin (MULTIVITAMIN) tablet Take 1 tablet by mouth daily.   pantoprazole  (PROTONIX ) 40 MG tablet Take 1 tablet (40 mg total) by mouth daily.   rosuvastatin  (CRESTOR ) 20 MG tablet Take 1 tablet (20 mg total) by mouth daily.   [DISCONTINUED] Acetaminophen (TYLENOL ARTHRITIS PAIN PO) Take by mouth 2 (two) times daily. (Patient not taking: Reported on 02/26/2024)   [DISCONTINUED] acetaminophen (TYLENOL) 500 MG tablet Take 1,000 mg by mouth every 6 (six) hours as needed. (Patient not taking: Reported on 02/26/2024)   [DISCONTINUED] albuterol  (VENTOLIN  HFA)  108 (90 Base) MCG/ACT inhaler Inhale 2 puffs into the lungs every 6 (six) hours as needed for wheezing or shortness of breath. (Patient not taking: Reported on 02/26/2024)   [DISCONTINUED] Albuterol -Budesonide (AIRSUPRA ) 90-80 MCG/ACT AERO Inhale 2 puffs into the lungs every 4 (four) hours as needed (cough, sob, wheezing). (Patient not taking: Reported on 02/26/2024)   [DISCONTINUED] famotidine  (PEPCID ) 20 MG tablet Take 1 tablet (20 mg total) by mouth daily.   [DISCONTINUED] pantoprazole  (PROTONIX ) 40 MG tablet Take 1 tablet (40 mg total) by mouth daily.   [DISCONTINUED] rosuvastatin  (CRESTOR ) 20 MG tablet Take 1 tablet (20 mg total) by mouth daily.   No facility-administered encounter medications on file as of 03/12/2024.     Lab Results  Component Value Date   WBC 8.9 09/07/2023   HGB 13.4 09/07/2023   HCT 39.7 09/07/2023   PLT 254.0 09/07/2023   GLUCOSE 98 03/10/2024   CHOL 143 03/10/2024   TRIG 82.0 03/10/2024   HDL 62.50 03/10/2024   LDLCALC 64 03/10/2024   ALT 20 03/10/2024   AST 21 03/10/2024   NA 144 03/10/2024   K 3.9 03/10/2024   CL 107 03/10/2024   CREATININE 0.80 03/10/2024   BUN 10 03/10/2024   CO2 29 03/10/2024   TSH 3.66 09/07/2023    MR LUMBAR SPINE WO CONTRAST Result Date: 12/27/2023 CLINICAL DATA:  Low back pain radiating to the right lower extremity EXAM: MRI LUMBAR SPINE WITHOUT CONTRAST TECHNIQUE: Multiplanar, multisequence MR imaging of the lumbar spine was performed. No intravenous contrast was administered. COMPARISON:  MRI lumbar May 24, 2017 FINDINGS: Segmentation:  Standard. Alignment:  Physiologic. Vertebrae:  No fracture, evidence of discitis, or bone lesion. Conus medullaris and cauda equina: Conus extends  to the T12 level. Conus and cauda equina appear normal. Paraspinal and other soft tissues: Negative. Disc levels: T12-L1: Small central spondylitic bulging disc effaces the ventral anterior epidural space flattening the thecal sac. No canal stenosis. Small  central disc herniation component. L1-L2: Desiccated disc material. No disc herniation or canal stenosis no foraminal narrowing. L2-L3:Desiccated disc material no disc herniation canal stenosis or foraminal narrowing. L3-L4: Mildly desiccated disc material. No disc herniation canal stenosis or foraminal narrowing. L4-L5: Degenerative disc disease, narrowing of the disc space, desiccated disc material with a small to medium-sized central spondylitic disc herniation flattening the thecal sac and encroaching on both neural foramina, aggravated by the hypertrophic osteoarthritic changes of the ligamentum flavum bilaterally and hypertrophic osteoarthritic changes of the facet joints with left facet joint synovitis. L5-S1: Small central spondylitic disc herniation flattening the thecal sac without foraminal narrowing. IMPRESSION: *Degenerative disc disease at L4-L5 with a small to medium-sized central spondylitic disc herniation flattening the thecal sac and encroaching on both neural foramina, aggravated by the hypertrophic osteoarthritic changes of the ligamentum flavum bilaterally and hypertrophic osteoarthritic changes of the facet joints with left facet joint synovitis. *Small central spondylitic disc herniation at L5-S1. *Small central spondylitic disc herniation at T12-L1. Electronically Signed   By: Fredrich Jefferson M.D.   On: 12/27/2023 10:13       Assessment & Plan:  Routine general medical examination at a health care facility  Hypercholesterolemia Assessment & Plan: On crestor  20mg  q day.  Continue low cholesterol diet and exercise.  Follow lipid panel and liver function tests. Discussed recent labs. No changes in medication today.   Orders: -     CBC with Differential/Platelet; Future -     Hepatic function panel; Future -     TSH; Future -     Lipid panel; Future  Stage 3a chronic kidney disease (HCC) -     Basic metabolic panel with GFR; Future  Health care maintenance Assessment &  Plan: Physical today 03/12/24.  Mammogram 06/18/23 - Birads II.  Colonoscopy 07/2014 - normal.  Recommended f/u in 10 years.  Order placed for referral.    Visit for screening mammogram -     3D Screening Mammogram, Left and Right; Future  Colon cancer screening Assessment & Plan: Last colonoscopy 07/2014. Recommended f/u in 10 years. Order placed for referral.   Orders: -     Ambulatory referral to Gastroenterology  Right-sided low back pain with right-sided sciatica, unspecified chronicity Assessment & Plan: Persistent low back and right leg pain. Has tried medication, PT and epidural injections. Persistent pain. Limiting activity. Refer to neurosurgery for further evaluation and treatment.   Orders: -     Ambulatory referral to Neurosurgery  Senile purpura [facility]) Assessment & Plan: Easy bruising. On plavix . Follow.    Angiomyolipoma Assessment & Plan: Worked up by Dr Serena Dana (oncology) previously. At that time, felt no further w/up warranted.  Recent CT scan -  revealed a chronic left lower pole renal mass versus complicated cyst has increased by less than 1 cm in size since 2008. Recommend follow-up with Urology. Saw urology 10/31/23 - recommended no further imaging.     Gastroesophageal reflux disease without esophagitis Assessment & Plan: No upper symptoms reported. Continues on protonix .    History of TIA (transient ischemic attack) Assessment & Plan: Continue plavix  and crestor .    Other orders -     Famotidine ; Take 1 tablet (20 mg total) by mouth daily.  Dispense: 90 tablet; Refill: 3 -  Pantoprazole  Sodium; Take 1 tablet (40 mg total) by mouth daily.  Dispense: 90 tablet; Refill: 3 -     Rosuvastatin  Calcium ; Take 1 tablet (20 mg total) by mouth daily.  Dispense: 90 tablet; Refill: 3     Dellar Fenton, MD

## 2024-03-12 NOTE — Assessment & Plan Note (Signed)
 Last colonoscopy 07/2014. Recommended f/u in 10 years. Order placed for referral.

## 2024-03-12 NOTE — Assessment & Plan Note (Signed)
 Easy bruising. On plavix . Follow.

## 2024-03-12 NOTE — Assessment & Plan Note (Signed)
 Persistent low back and right leg pain. Has tried medication, PT and epidural injections. Persistent pain. Limiting activity. Refer to neurosurgery for further evaluation and treatment.

## 2024-03-12 NOTE — Assessment & Plan Note (Signed)
No upper symptoms reported.  Continues on protonix.  

## 2024-03-12 NOTE — Addendum Note (Signed)
 Addended by: Raejean Bullock on: 03/12/2024 08:38 AM   Modules accepted: Orders

## 2024-03-12 NOTE — Assessment & Plan Note (Signed)
 Worked up by Dr Serena Dana (oncology) previously. At that time, felt no further w/up warranted.  Recent CT scan -  revealed a chronic left lower pole renal mass versus complicated cyst has increased by less than 1 cm in size since 2008. Recommend follow-up with Urology. Saw urology 10/31/23 - recommended no further imaging.

## 2024-03-24 ENCOUNTER — Encounter: Payer: Self-pay | Admitting: Internal Medicine

## 2024-03-25 NOTE — Telephone Encounter (Signed)
 Patient following up on referral to Dr Adonis Alamin on 03/12/24.

## 2024-03-26 NOTE — Telephone Encounter (Signed)
 Noted

## 2024-03-27 ENCOUNTER — Telehealth: Payer: Self-pay

## 2024-03-27 NOTE — Telephone Encounter (Signed)
 Copied from CRM [id number]. Topic: General - Other >> Mar 26, 2024  3:45 PM Howard Macho wrote: Reason for CRM: patient called stating [facility] in [place] need the  back injection notes from [facility] and the patient want to know when it is done. Patient gave the fax number for [facility] Fax [phone]-patient number

## 2024-03-31 NOTE — Telephone Encounter (Signed)
 Patient aware that she will need to contact [facility] to get her records from Dr [name] Ammons.

## 2024-04-02 DIAGNOSIS — M48062 Spinal stenosis, lumbar region with neurogenic claudication: Secondary | ICD-10-CM | POA: Diagnosis not present

## 2024-04-02 DIAGNOSIS — M4310 Spondylolisthesis, site unspecified: Secondary | ICD-10-CM | POA: Diagnosis not present

## 2024-04-04 ENCOUNTER — Other Ambulatory Visit: Payer: Self-pay | Admitting: Neurosurgery

## 2024-04-04 ENCOUNTER — Telehealth: Payer: Self-pay

## 2024-04-04 NOTE — Telephone Encounter (Signed)
 We received a Preoperative Medical Clearance for patient via fax today from [facility].  I sent a copy to Dr. Urban Garden Scott's folder on the S drive and hand-delivered a copy to Victorino Grates, LPN.

## 2024-04-09 ENCOUNTER — Telehealth: Payer: Self-pay

## 2024-04-09 NOTE — Telephone Encounter (Signed)
 Ok for 7/3 appt

## 2024-04-09 NOTE — Telephone Encounter (Signed)
Pt has been scheduled,

## 2024-04-09 NOTE — Telephone Encounter (Signed)
 Received form from Dr Bettie Bruce office to get clearance on stopping plavix  7 days prior to surgery on 7/14. Tried to schedule patient for a time to talk with you about stopping blood thinners. She is out of town 6/28-7/1 and again 7/6-7/13. She says the only days she could do is 7/2 or 7/3. Would you prefer her be in office or is virtual ok? We could do 4pm on 7/3. Just wanted to check with you first. She is aware that you are out of office this week.

## 2024-04-11 ENCOUNTER — Encounter: Payer: Self-pay | Admitting: Internal Medicine

## 2024-04-16 NOTE — Progress Notes (Signed)
 Surgical Instructions   Your procedure is scheduled on May 05, 2024. Report to [facility] Main Entrance A at 6:00 A.M., then check in with the Admitting office. Any questions or running late day of surgery: call [phone]  Questions prior to your surgery date: call [phone], Monday-Friday, 8am-4pm. If you experience any cold or flu symptoms such as cough, fever, chills, shortness of breath, etc. between now and your scheduled surgery, please notify us  at the above number.     Remember:  Do not eat or drink after midnight the night before your surgery   Take these medicines the morning of surgery with A SIP OF WATER  famotidine  (PEPCID )  fluticasone (FLONASE)  pantoprazole  (PROTONIX )  rosuvastatin  (CRESTOR )   May take these medicines IF NEEDED: cetirizine (ZYRTEC)   clopidogrel  (PLAVIX ) LAST DOSE 04-27-24 PLEASE REACH OUT TO SURGEON FOR INSTRUCTIONS REGARDING Aspirin  One week prior to surgery, STOP taking any Aspirin (unless otherwise instructed by your surgeon) Aleve, Naproxen, Ibuprofen, Motrin, Advil, Goody's, BC's, all herbal medications, fish oil, and non-prescription vitamins.                     Do NOT Smoke (Tobacco/Vaping) for 24 hours prior to your procedure.  If you use a CPAP at night, you may bring your mask/headgear for your overnight stay.   You will be asked to remove any contacts, glasses, piercing's, hearing aid's, dentures/partials prior to surgery. Please bring cases for these items if needed.    Patients discharged the day of surgery will not be allowed to drive home, and someone needs to stay with them for 24 hours.  SURGICAL WAITING ROOM VISITATION Patients may have no more than 2 support people in the waiting area - these visitors may rotate.   Pre-op nurse will coordinate an appropriate time for 1 ADULT support person, who may not rotate, to accompany patient in pre-op.  Children under the age of 22 must have an adult with them who is not the  patient and must remain in the main waiting area with an adult.  If the patient needs to stay at the hospital during part of their recovery, the visitor guidelines for inpatient rooms apply.  Please refer to the [facility] website for the visitor guidelines for any additional information.   If you received a COVID test during your pre-op visit  it is requested that you wear a mask when out in public, stay away from anyone that may not be feeling well and notify your surgeon if you develop symptoms. If you have been in contact with anyone that has tested positive in the last 10 days please notify you surgeon.      Pre-operative 5 CHG Bathing Instructions   You can play a key role in reducing the risk of infection after surgery. Your skin needs to be as free of germs as possible. You can reduce the number of germs on your skin by washing with CHG (chlorhexidine gluconate) soap before surgery. CHG is an antiseptic soap that kills germs and continues to kill germs even after washing.   DO NOT use if you have an allergy to chlorhexidine/CHG or antibacterial soaps. If your skin becomes reddened or irritated, stop using the CHG and notify one of our RNs at [phone].   Please shower with the CHG soap starting 4 days before surgery using the following schedule:     Please keep in mind the following:  DO NOT shave, including legs and underarms,  starting the day of your first shower.   You may shave your face at any point before/day of surgery.  Place clean sheets on your bed the day you start using CHG soap. Use a clean washcloth (not used since being washed) for each shower. DO NOT sleep with pets once you start using the CHG.   CHG Shower Instructions:  Wash your face and private area with normal soap. If you choose to wash your hair, wash first with your normal shampoo.  After you use shampoo/soap, rinse your hair and body thoroughly to remove shampoo/soap residue.  Turn the water OFF  and apply about 3 tablespoons (45 ml) of CHG soap to a CLEAN washcloth.  Apply CHG soap ONLY FROM YOUR NECK DOWN TO YOUR TOES (washing for 3-5 minutes)  DO NOT use CHG soap on face, private areas, open wounds, or sores.  Pay special attention to the area where your surgery is being performed.  If you are having back surgery, having someone wash your back for you may be helpful. Wait 2 minutes after CHG soap is applied, then you may rinse off the CHG soap.  Pat dry with a clean towel  Put on clean clothes/pajamas   If you choose to wear lotion, please use ONLY the CHG-compatible lotions that are listed below.  Additional instructions for the day of surgery: DO NOT APPLY any lotions, deodorants, cologne, or perfumes.   Do not bring valuables to the hospital. [facility] is not responsible for any belongings/valuables. Do not wear nail polish, gel polish, artificial nails, or any other type of covering on natural nails (fingers and toes) Do not wear jewelry or makeup Put on clean/comfortable clothes.  Please brush your teeth.  Ask your nurse before applying any prescription medications to the skin.     CHG Compatible Lotions   Aveeno Moisturizing lotion  Cetaphil Moisturizing Cream  Cetaphil Moisturizing Lotion  Clairol Herbal Essence Moisturizing Lotion, Dry Skin  Clairol Herbal Essence Moisturizing Lotion, Extra Dry Skin  Clairol Herbal Essence Moisturizing Lotion, Normal Skin  Curel Age Defying Therapeutic Moisturizing Lotion with Alpha Hydroxy  Curel Extreme Care Body Lotion  Curel Soothing Hands Moisturizing Hand Lotion  Curel Therapeutic Moisturizing Cream, Fragrance-Free  Curel Therapeutic Moisturizing Lotion, Fragrance-Free  Curel Therapeutic Moisturizing Lotion, Original Formula  Eucerin Daily Replenishing Lotion  Eucerin Dry Skin Therapy Plus Alpha Hydroxy Crme  Eucerin Dry Skin Therapy Plus Alpha Hydroxy Lotion  Eucerin Original Crme  Eucerin Original Lotion   Eucerin Plus Crme Eucerin Plus Lotion  Eucerin TriLipid Replenishing Lotion  Keri Anti-Bacterial Hand Lotion  Keri Deep Conditioning Original Lotion Dry Skin Formula Softly Scented  Keri Deep Conditioning Original Lotion, Fragrance Free Sensitive Skin Formula  Keri Lotion Fast Absorbing Fragrance Free Sensitive Skin Formula  Keri Lotion Fast Absorbing Softly Scented Dry Skin Formula  Keri Original Lotion  Keri Skin Renewal Lotion Keri Silky Smooth Lotion  Keri Silky Smooth Sensitive Skin Lotion  Nivea Body Creamy Conditioning Oil  Nivea Body Extra Enriched Lotion  Nivea Body Original Lotion  Nivea Body Sheer Moisturizing Lotion Nivea Crme  Nivea Skin Firming Lotion  NutraDerm 30 Skin Lotion  NutraDerm Skin Lotion  NutraDerm Therapeutic Skin Cream  NutraDerm Therapeutic Skin Lotion  ProShield Protective Hand Cream  Provon moisturizing lotion  Please read over the following fact sheets that you were given.

## 2024-04-17 ENCOUNTER — Other Ambulatory Visit: Payer: Self-pay

## 2024-04-17 ENCOUNTER — Encounter (HOSPITAL_COMMUNITY)
Admission: RE | Admit: 2024-04-17 | Discharge: 2024-04-17 | Disposition: A | Discharge status: Home / Self Care | Source: Ambulatory Visit | Attending: Neurosurgery | Admitting: Neurosurgery

## 2024-04-17 ENCOUNTER — Encounter (HOSPITAL_COMMUNITY): Payer: Self-pay

## 2024-04-17 VITALS — BP 114/67 | HR 66 | Temp 98.4°F | Resp 16 | Ht 62.5 in | Wt 122.4 lb

## 2024-04-17 DIAGNOSIS — Z01818 Encounter for other preprocedural examination: Secondary | ICD-10-CM | POA: Diagnosis not present

## 2024-04-17 LAB — CBC
HCT: 41.2 % (ref 36.0–46.0)
Hemoglobin: 13.9 g/dL (ref 12.0–15.0)
MCH: 31.4 pg (ref 26.0–34.0)
MCHC: 33.7 g/dL (ref 30.0–36.0)
MCV: 93.2 fL (ref 80.0–100.0)
Platelets: 203 10*3/uL (ref 150–400)
RBC: 4.42 MIL/uL (ref 3.87–5.11)
RDW: 12 % (ref 11.5–15.5)
WBC: 6.4 10*3/uL (ref 4.0–10.5)
nRBC: 0 % (ref 0.0–0.2)

## 2024-04-17 LAB — BASIC METABOLIC PANEL WITH GFR
Anion gap: 9 (ref 5–15)
BUN: 9 mg/dL (ref 8–23)
CO2: 27 mmol/L (ref 22–32)
Calcium: 9.1 mg/dL (ref 8.9–10.3)
Chloride: 104 mmol/L (ref 98–111)
Creatinine, Ser: 0.87 mg/dL (ref 0.44–1.00)
GFR, Estimated: >60 mL/min (ref >60)
Glucose, Bld: 88 mg/dL (ref 70–99)
Potassium: 4.4 mmol/L (ref 3.5–5.1)
Sodium: 140 mmol/L (ref 135–145)

## 2024-04-17 LAB — SURGICAL PCR SCREEN
MRSA, PCR: POSITIVE — AB
Staphylococcus aureus: POSITIVE — AB

## 2024-04-17 NOTE — Progress Notes (Addendum)
 PCP - Allena Hamilton   -patient is scheduled to see Dr. Hamilton on 7/3 and will be given Aspirin and Plavix  instructions at that time.  Patient is aware that Dr. Louis requested Plavix  be held for 7 days but needed to see Dr. Hamilton to confirm.  Cardiologist - denies  PPM/ICD - denies   Chest x-ray - denies EKG - 04/17/24 Stress Test - over 10 years ECHO - over 10 years Cardiac Cath - denies  Sleep Study - denies  No DM  Last dose of GLP1 agonist-  n/a GLP1 instructions:  n/a  Blood Thinner Instructions: patient awaiting instructions from Dr. Hamilton Aspirin Instructions: patient awaiting instructions from Dr. Hamilton  ERAS Protcol - NPO   COVID TEST- n/a   Anesthesia review: yes - TIA, Plavix   Patient denies shortness of breath, fever, cough and chest pain at PAT appointment   All instructions explained to the patient, with a verbal understanding of the material. Patient agrees to go over the instructions while at home for a better understanding. Patient also instructed to self quarantine after being tested for COVID-19. The opportunity to ask questions was provided.   Patient will need a type and screen collected on DOS.  Patient's PAT appointment was more than 2 weeks from surgery date.

## 2024-04-18 NOTE — Progress Notes (Signed)
 Shanda, [place] scheduler with Dr. Louis, made aware of patients +MRSA and +MSSA on surgical PCR

## 2024-04-21 NOTE — Anesthesia Preprocedure Evaluation (Addendum)
 Anesthesia Evaluation  Patient identified by MRN, date of birth, ID band Patient awake    Reviewed: Allergy & Precautions, NPO status , Patient's Chart, lab work & pertinent test results  History of Anesthesia Complications Negative for: history of anesthetic complications  Airway Mallampati: II  TM Distance: >3 FB Neck ROM: Full    Dental  (+) Teeth Intact   Pulmonary neg pulmonary ROS, neg sleep apnea, neg COPD, Patient abstained from smoking.Not current smoker   Pulmonary exam normal breath sounds clear to auscultation       Cardiovascular Exercise Tolerance: Good METS(-) hypertension+ Peripheral Vascular Disease  (-) CAD and (-) Past MI (-) dysrhythmias  Rhythm:Regular Rate:Normal - Systolic murmurs    Neuro/Psych TIA negative psych ROS   GI/Hepatic ,GERD  Medicated and Controlled,,(+)     (-) substance abuse    Endo/Other  neg diabetes    Renal/GU negative Renal ROS     Musculoskeletal   Abdominal   Peds  Hematology   Anesthesia Other Findings Past Medical History: No date: Allergic rhinitis     Comment:  seasonal No date: Arthritis 2014: Cataract     Comment:  Routine No date: Elevated TSH No date: Esophagitis No date: GERD (gastroesophageal reflux disease) 10/23/2010: History of Helicobacter pylori infection No date: Hypercholesterolemia No date: Osteopenia 2001: Stroke [facility])     Comment:  Mini stroke 10/24/1999: TIA (transient ischemic attack)  Reproductive/Obstetrics                              Anesthesia Physical Anesthesia Plan  ASA: 2  Anesthesia Plan: General   Post-op Pain Management: Tylenol  PO (pre-op)* and Dilaudid  IV   Induction: Intravenous  PONV Risk Score and Plan: 4 or greater and Ondansetron , Dexamethasone , Propofol  infusion, TIVA and Treatment may vary due to age or medical condition  Airway Management Planned: Oral ETT  Additional  Equipment: None  Intra-op Plan:   Post-operative Plan: Extubation in OR  Informed Consent: I have reviewed the patients History and Physical, chart, labs and discussed the procedure including the risks, benefits and alternatives for the proposed anesthesia with the patient or authorized representative who has indicated his/her understanding and acceptance.     Dental advisory given  Plan Discussed with: CRNA and Surgeon  Anesthesia Plan Comments: (Discussed risks of anesthesia with patient, including PONV, sore throat, lip/dental/eye damage, post operative cognitive dysfunction. Rare risks discussed as well, such as cardiorespiratory and neurological sequelae, and allergic reactions. Discussed the role of CRNA in patient's perioperative care. Patient understands.)        Anesthesia Quick Evaluation

## 2024-04-21 NOTE — Progress Notes (Addendum)
 Anesthesia Chart Review:  Case: [id number] Date/Time: 05/05/24 0745   Procedure: POSTERIOR LUMBAR FUSION 1 LEVEL (Back) - PLIF - L4-L5 - Posterior Lateral and Interbody fusion   Anesthesia type: General   Diagnosis: Degenerative spondylolisthesis [M43.10]   Pre-op diagnosis: Degenerative spondylolisthesis   [facility]: [facility] ROOM 20 / [facility]   Surgeons: Louis Shove, MD       DISCUSSION: Patient is a 78 year old female scheduled for the above procedure.   History includes never smoker, hypercholesterolemia, TAI (2001), GERD, elevated TSH (04/20/15; normal on f/u), H. Pylori (2012).   She had pulmonology evaluation with Dr. Tamea in January 2025 for persistent cough and concern for asthma. 09/2023 chest CT showed mild diffuse bilateral bronchial wall thickening and mild lobular air trapping felt consistent with nonspecific infectious or inflammatory bronchitis and small airways disease. There was minimal bibasilar scarring with no evidence of ILD. Airsupra  prescribed, but insurance denied. 02/26/24 PFTs were showed normal pulmonary function. At this follow-up, she denied SOB or significant cough since her last visit. It was thought her coughing may have been related to a viral infection which had since resolved. She did not think patient would require a maintenance inhaler. Follow-up as needed recommended.   She is on Plavix  for history of TIA remotely.   She has a preoperative visit with PCP Glendia Shad, MD on 04/24/24. She reported Dr. Joshua is requesting a 7 day hold, but says Dr. Glendia to advise perioperative ASA and Plavix  instructions at her upcoming visit. 04/17/25 CBC and BMP were normal. EKG was stable. Chart will be left for follow-up. (UPDATE 04/28/24 12:24 PM: Preoperative evaluation visit note reviewed. Per Dr. Glendia, Planning for back surgery as outlined. She had pre op labs and EKG. EKG - - no acute change when compared to previous. No chest pain or sob with activity. Given above - low  cardiac risk to proceed with planned surgery. She is on plavix  - history of TIA. Has been on plavix  for years. Discussed request to stop for the surgery. Discussed risk of stopping. She understands risk and is wanting to proceed. She is also on asprin. Called [facility] to confirm if she has to stop aspirin or if she can remain on aspirin for the surgery.)   VS:  Wt Readings from Last 3 Encounters:  04/17/24 55.5 kg  03/12/24 55.8 kg  02/26/24 55.7 kg   BP Readings from Last 3 Encounters:  04/17/24 114/67  03/12/24 110/72  02/26/24 124/78   Pulse Readings from Last 3 Encounters:  04/17/24 66  03/12/24 82  02/26/24 75     PROVIDERS: Glendia Shad, MD is PCP  - Tamea KYM Doffing, MD is pulmonologist. As needed follow-up recommended at 02/26/24 visit. GLENWOOD Francisca Rogue, MD is urologist. 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 They discussed imaging surveillance versus watchful waiting. He wrote,  I think with the stability of the lesion over the last 15 to 20 years, deferring ongoing imaging is very reasonable. As needed follow-up planned.    LABS: Most recent lab results in [facility] include: Lab Results  Component Value Date   WBC 6.4 04/17/2024   HGB 13.9 04/17/2024   HCT 41.2 04/17/2024   PLT 203 04/17/2024   GLUCOSE 88 04/17/2024   CHOL 143 03/10/2024   TRIG 82.0 03/10/2024   HDL 62.50 03/10/2024  LDLCALC 64 03/10/2024   ALT 20 03/10/2024   AST 21 03/10/2024   NA 140 04/17/2024   K 4.4 04/17/2024   CL 104 04/17/2024   CREATININE 0.87 04/17/2024   BUN 9 04/17/2024   CO2 27 04/17/2024   TSH 3.66 09/07/2023    PFTs 02/26/24: FVC 2.63 (107%), post 2.60 (105%). FEV1 2.10 (114%), post 2.21 (121%). DLCO unc 16.80 (96%). Normal pulmonary function.    IMAGES: MRI L-spine 12/12/23: IMPRESSION: *Degenerative  disc disease at L4-L5 with a small to medium-sized central spondylitic disc herniation flattening the thecal sac and encroaching on both neural foramina, aggravated by the hypertrophic osteoarthritic changes of the ligamentum flavum bilaterally and hypertrophic osteoarthritic changes of the facet joints with left facet joint synovitis. *Small central spondylitic disc herniation at L5-S1. *Small central spondylitic disc herniation at T12-L1.  CT Chest 10/10/23: IMPRESSION: 1. Mild diffuse bilateral bronchial wall thickening and mild lobular air trapping on expiratory phase imaging, consistent with nonspecific infectious or inflammatory bronchitis and small airways disease. 2. Minimal, bland appearing bibasilar scarring. No evidence of fibrotic interstitial lung disease. - Aortic Atherosclerosis (ICD10-I70.0).   EKG: 04/17/24: Sinus bradycardia at 59 bpm Low voltage QRS Nonspecific ST and T wave abnormality Abnormal ECG When compared with ECG of 10-Jan-2019 14:23, PREVIOUS ECG IS PRESENT No significant change since last tracing Confirmed by Court Carrier [phone]) on 04/17/2024 2:33:43 PM  CV: US  Carotid 05/22/18: IMPRESSION: Less than 50% stenosis in the right and left internal carotid arteries.   She reported a stress test and echo > 10 year ago.   Past Medical History:  Diagnosis Date   Allergic rhinitis    seasonal   Elevated TSH    Esophagitis    GERD (gastroesophageal reflux disease)    History of Helicobacter pylori infection 2012   Hypercholesterolemia    Osteopenia    TIA (transient ischemic attack) 2001    Past Surgical History:  Procedure Laterality Date   BREAST BIOPSY Right 2000   neg   BREAST BIOPSY Right 05/18/2017   adipose tissue, coil marker   BREAST EXCISIONAL BIOPSY Right 1970   NEG   CATARACT EXTRACTION Bilateral    COLONOSCOPY  2005   normal    ESOPHAGOGASTRODUODENOSCOPY  2012   normal    HEMORRHOID SURGERY  1980s   INGUINAL HERNIA  REPAIR Left     MEDICATIONS: No current facility-administered medications for this encounter.    ascorbic acid (VITAMIN C) 500 MG tablet   aspirin 81 MG tablet   Calcium  Carbonate-Vit D-Min 600-400 MG-UNIT TABS   cetirizine (ZYRTEC) 10 MG tablet   Cholecalciferol (D3-1000 PO)   clopidogrel  (PLAVIX ) 75 MG tablet   cyanocobalamin (VITAMIN B12) 1000 MCG tablet   estradiol (ESTRACE) 0.1 MG/GM vaginal cream   famotidine  (PEPCID ) 20 MG tablet   fluticasone (FLONASE) 50 MCG/ACT nasal spray   Multiple Vitamin (MULTIVITAMIN) tablet   pantoprazole  (PROTONIX ) 40 MG tablet   rosuvastatin  (CRESTOR ) 20 MG tablet    Isaiah Ruder, PA-C Surgical Short Stay/Anesthesiology [facility] Phone [phone] [facility] Phone [phone] 04/21/2024 5:50 PM

## 2024-04-24 ENCOUNTER — Encounter: Payer: Self-pay | Admitting: Internal Medicine

## 2024-04-24 ENCOUNTER — Ambulatory Visit: Admitting: Internal Medicine

## 2024-04-24 VITALS — BP 114/68 | HR 68 | Temp 98.0°F | Resp 20 | Ht 62.5 in | Wt 124.0 lb

## 2024-04-24 DIAGNOSIS — Z01818 Encounter for other preprocedural examination: Secondary | ICD-10-CM | POA: Diagnosis not present

## 2024-04-24 DIAGNOSIS — D692 Other nonthrombocytopenic purpura: Secondary | ICD-10-CM

## 2024-04-24 NOTE — Progress Notes (Signed)
 Subjective:    Patient ID: Joan Ritter, female    DOB: 10/01/1946, 78 y.o.   MRN: [id number]  Patient here for  Chief Complaint  Patient presents with   Pre-op Exam    HPI Here for work in appt - work in to discuss pre op evaluation. Has a history of TIA. Has been maintained on plavix . Has degenerative disc disease at L4-L5 with a small disc herniation. Back pain can limit her activity. Planning for L4-5 lumbar fusion. She denies any chest pain or sob. Able to go up and down stairs, vacuum, etc - without chest pain or increased sob. No increased heart rate or palpitations. Eating. No nausea or vomiting. No abdominal pain. No bowel change reported. Discussed plavix  and risk fo stopping.    Past Medical History:  Diagnosis Date   Allergic rhinitis    seasonal   Arthritis    Cataract 2014   Routine   Elevated TSH    Esophagitis    GERD (gastroesophageal reflux disease)    History of Helicobacter pylori infection 10/23/2010   Hypercholesterolemia    Osteopenia    Stroke (HCC) 2001   Mini stroke   TIA (transient ischemic attack) 10/24/1999   Past Surgical History:  Procedure Laterality Date   BREAST BIOPSY Right 2000   neg   BREAST BIOPSY Right 05/18/2017   adipose tissue, coil marker   BREAST EXCISIONAL BIOPSY Right 1970   NEG   CATARACT EXTRACTION Bilateral    COLONOSCOPY  2005   normal    ESOPHAGOGASTRODUODENOSCOPY  2012   normal    HEMORRHOID SURGERY  1980s   INGUINAL HERNIA REPAIR Left    Family History  Problem Relation Age of Onset   Lymphoma Father    Cancer Father    CVA Mother        h/o cerebral hemorrhage   Stroke Mother    Brain cancer Brother    Cardiomyopathy Sister        enlarged heart, died age 38 - sudden death   Breast cancer Maternal Aunt        x2   Cancer Paternal Aunt    Cancer Brother    Heart disease Sister    Cancer Brother    Cancer Paternal Aunt    Colon cancer Neg Hx    Social History   Socioeconomic History   Marital  status: Married    Spouse name: Not on file   Number of children: 2   Years of education: Not on file   Highest education level: Bachelor's degree (e.g., BA, AB, BS)  Occupational History   Not on file  Tobacco Use   Smoking status: Never   Smokeless tobacco: Never  Vaping Use   Vaping status: Never Used  Substance and Sexual Activity   Alcohol use: No    Alcohol/week: 0.0 standard drinks of alcohol   Drug use: No   Sexual activity: Yes    Birth control/protection: Post-menopausal, Abstinence  Other Topics Concern   Not on file  Social History Narrative   Not on file   Social Drivers of Health   Financial Resource Strain: Low Risk  (03/05/2024)   Overall Financial Resource Strain (CARDIA)    Difficulty of Paying Living Expenses: Not hard at all  Food Insecurity: No Food Insecurity (03/05/2024)   Hunger Vital Sign    Worried About Running Out of Food in the Last Year: Never true    Ran Out of Food in the  Last Year: Never true  Transportation Needs: No Transportation Needs (03/05/2024)   PRAPARE - Administrator, Civil Service (Medical): No    Lack of Transportation (Non-Medical): No  Physical Activity: Insufficiently Active (03/05/2024)   Exercise Vital Sign    Days of Exercise per Week: 3 days    Minutes of Exercise per Session: 30 min  Stress: No Stress Concern Present (03/05/2024)   [employer] of Occupational Health - Occupational Stress Questionnaire    Feeling of Stress : Not at all  Social Connections: Socially Integrated (03/05/2024)   Social Connection and Isolation Panel    Frequency of Communication with Friends and Family: More than three times a week    Frequency of Social Gatherings with Friends and Family: More than three times a week    Attends Religious Services: More than 4 times per year    Active Member of [employer] or Organizations: Yes    Attends Engineer, structural: More than 4 times per year    Marital Status: Married      Review of Systems  Constitutional:  Negative for appetite change and unexpected weight change.  HENT:  Negative for congestion and sinus pressure.   Respiratory:  Negative for cough, chest tightness and shortness of breath.   Cardiovascular:  Negative for chest pain, palpitations and leg swelling.  Gastrointestinal:  Negative for abdominal pain, diarrhea, nausea and vomiting.  Genitourinary:  Negative for difficulty urinating and dysuria.  Musculoskeletal:  Positive for back pain. Negative for joint swelling and myalgias.  Skin:  Negative for color change and rash.  Neurological:  Negative for dizziness and headaches.  Psychiatric/Behavioral:  Negative for agitation and dysphoric mood.        Objective:     BP 114/68   Pulse 68   Temp 98 F (36.7 C)   Resp 20   Ht 5' 2.5 (1.588 m)   Wt 124 lb (56.2 kg)   SpO2 98%   BMI 22.32 kg/m  Wt Readings from Last 3 Encounters:  04/24/24 124 lb (56.2 kg)  04/17/24 122 lb 6.4 oz (55.5 kg)  03/12/24 123 lb (55.8 kg)    Physical Exam Vitals reviewed.  Constitutional:      General: She is not in acute distress.    Appearance: Normal appearance.  HENT:     Head: Normocephalic and atraumatic.     Right Ear: External ear normal.     Left Ear: External ear normal.     Mouth/Throat:     Pharynx: No oropharyngeal exudate or posterior oropharyngeal erythema.  Eyes:     General: No scleral icterus.       Right eye: No discharge.        Left eye: No discharge.     Conjunctiva/sclera: Conjunctivae normal.  Neck:     Thyroid : No thyromegaly.  Cardiovascular:     Rate and Rhythm: Normal rate and regular rhythm.  Pulmonary:     Effort: No respiratory distress.     Breath sounds: Normal breath sounds. No wheezing.  Abdominal:     General: Bowel sounds are normal.     Palpations: Abdomen is soft.     Tenderness: There is no abdominal tenderness.  Musculoskeletal:        General: No swelling or tenderness.     Cervical back:  Neck supple. No tenderness.  Lymphadenopathy:     Cervical: No cervical adenopathy.  Skin:    Findings: No erythema or rash.  Neurological:  Mental Status: She is alert.  Psychiatric:        Mood and Affect: Mood normal.        Behavior: Behavior normal.         Outpatient Encounter Medications as of 04/24/2024  Medication Sig   ascorbic acid (VITAMIN C) 500 MG tablet Take 500 mg by mouth daily.   aspirin 81 MG tablet Take 81 mg by mouth daily.   Calcium  Carbonate-Vit D-Min 600-400 MG-UNIT TABS Take 1 tablet by mouth daily.   cetirizine (ZYRTEC) 10 MG tablet Take 10 mg by mouth daily as needed for allergies.   Cholecalciferol (D3-1000 PO) Take 1 tablet by mouth daily.   clopidogrel  (PLAVIX ) 75 MG tablet Take 1 tablet (75 mg total) by mouth daily.   cyanocobalamin (VITAMIN B12) 1000 MCG tablet Take 1,000 mcg by mouth daily.   estradiol (ESTRACE) 0.1 MG/GM vaginal cream Place 1 Applicatorful vaginally 3 (three) times a week.   famotidine  (PEPCID ) 20 MG tablet Take 1 tablet (20 mg total) by mouth daily.   fluticasone (FLONASE) 50 MCG/ACT nasal spray USE 2 SPRAYS EACH NOSTRIL EVERY DAY   gentamicin ointment (GARAMYCIN) 0.1 % Apply 1 Application topically as needed.   Multiple Vitamin (MULTIVITAMIN) tablet Take 1 tablet by mouth in the morning and at bedtime.   pantoprazole  (PROTONIX ) 40 MG tablet Take 1 tablet (40 mg total) by mouth daily.   rosuvastatin  (CRESTOR ) 20 MG tablet Take 1 tablet (20 mg total) by mouth daily.   No facility-administered encounter medications on file as of 04/24/2024.     Lab Results  Component Value Date   WBC 6.4 04/17/2024   HGB 13.9 04/17/2024   HCT 41.2 04/17/2024   PLT 203 04/17/2024   GLUCOSE 88 04/17/2024   CHOL 143 03/10/2024   TRIG 82.0 03/10/2024   HDL 62.50 03/10/2024   LDLCALC 64 03/10/2024   ALT 20 03/10/2024   AST 21 03/10/2024   NA 140 04/17/2024   K 4.4 04/17/2024   CL 104 04/17/2024   CREATININE 0.87 04/17/2024   BUN 9  04/17/2024   CO2 27 04/17/2024   TSH 3.66 09/07/2023       Assessment & Plan:  Pre-op evaluation Assessment & Plan: Planning for back surgery as outlined. She had pre op labs and EKG. EKG - - no acute change when compared to previous. No chest pain or sob with activity. Given above - low cardiac risk to proceed with planned surgery. She is on plavix  - history of TIA. Has been on plavix  for years. Discussed request to stop for the surgery. Discussed risk of stopping. She understands risk and is wanting to proceed. She is also on asprin. Called [facility] to confirm if she has to stop aspirin or if she can remain on aspirin for the surgery.    Senile purpura (HCC) Assessment & Plan: Easy bruising. On plavix . No other bleeding. Follow.       Allena Hamilton, MD

## 2024-04-26 ENCOUNTER — Encounter: Payer: Self-pay | Admitting: Internal Medicine

## 2024-04-26 NOTE — Assessment & Plan Note (Signed)
 Planning for back surgery as outlined. She had pre op labs and EKG. EKG - - no acute change when compared to previous. No chest pain or sob with activity. Given above - low cardiac risk to proceed with planned surgery. She is on plavix  - history of TIA. Has been on plavix  for years. Discussed request to stop for the surgery. Discussed risk of stopping. She understands risk and is wanting to proceed. She is also on asprin. Called [facility] to confirm if she has to stop aspirin or if she can remain on aspirin for the surgery.

## 2024-04-26 NOTE — Assessment & Plan Note (Signed)
 Easy bruising. On plavix . No other bleeding. Follow.

## 2024-04-28 ENCOUNTER — Telehealth: Payer: Self-pay | Admitting: Internal Medicine

## 2024-04-28 NOTE — Telephone Encounter (Signed)
 noted

## 2024-04-28 NOTE — Telephone Encounter (Signed)
 Called to notify - I did hear back from [facility]. She is able to continue her aspirin daily. Does need to stop the plavix  as discussed - 7 days prior to surgery. Lett message for pt.

## 2024-04-28 NOTE — Telephone Encounter (Unsigned)
 Copied from CRM [id number]. Topic: Clinical - Medication Question >> Apr 28, 2024  1:08 PM Corin V wrote: Reason for CRM: Patient returned call from Dr. Glendia. Advised patient that she can continue taking asprin but to stop the plavix  7 days before surgery per Dr. Freda note.

## 2024-05-05 ENCOUNTER — Ambulatory Visit (HOSPITAL_COMMUNITY)

## 2024-05-05 ENCOUNTER — Ambulatory Visit (HOSPITAL_COMMUNITY): Payer: Self-pay | Admitting: Vascular Surgery

## 2024-05-05 ENCOUNTER — Encounter (HOSPITAL_COMMUNITY): Payer: Self-pay | Admitting: Neurosurgery

## 2024-05-05 ENCOUNTER — Observation Stay (HOSPITAL_COMMUNITY)
Admission: RE | Admit: 2024-05-05 | Discharge: 2024-05-06 | Disposition: A | Discharge status: Home / Self Care | Attending: Neurosurgery | Admitting: Neurosurgery

## 2024-05-05 ENCOUNTER — Encounter (HOSPITAL_COMMUNITY)
Admission: RE | Disposition: A | Discharge status: Home / Self Care | Payer: Self-pay | Source: Home / Self Care | Attending: Neurosurgery

## 2024-05-05 ENCOUNTER — Other Ambulatory Visit: Payer: Self-pay

## 2024-05-05 DIAGNOSIS — Z981 Arthrodesis status: Secondary | ICD-10-CM | POA: Diagnosis not present

## 2024-05-05 DIAGNOSIS — I10 Essential (primary) hypertension: Secondary | ICD-10-CM | POA: Insufficient documentation

## 2024-05-05 DIAGNOSIS — M4316 Spondylolisthesis, lumbar region: Secondary | ICD-10-CM | POA: Diagnosis not present

## 2024-05-05 DIAGNOSIS — Z8673 Personal history of transient ischemic attack (TIA), and cerebral infarction without residual deficits: Secondary | ICD-10-CM | POA: Diagnosis not present

## 2024-05-05 DIAGNOSIS — Z7902 Long term (current) use of antithrombotics/antiplatelets: Secondary | ICD-10-CM | POA: Diagnosis not present

## 2024-05-05 DIAGNOSIS — M48062 Spinal stenosis, lumbar region with neurogenic claudication: Principal | ICD-10-CM | POA: Insufficient documentation

## 2024-05-05 DIAGNOSIS — Z79899 Other long term (current) drug therapy: Secondary | ICD-10-CM | POA: Diagnosis not present

## 2024-05-05 DIAGNOSIS — Z7982 Long term (current) use of aspirin: Secondary | ICD-10-CM | POA: Insufficient documentation

## 2024-05-05 DIAGNOSIS — M4310 Spondylolisthesis, site unspecified: Principal | ICD-10-CM | POA: Diagnosis present

## 2024-05-05 HISTORY — PX: BACK SURGERY: SHX140

## 2024-05-05 HISTORY — PX: LUMBAR FUSION: SHX111

## 2024-05-05 LAB — TYPE AND SCREEN
ABO/RH(D): A NEG
Antibody Screen: NEGATIVE

## 2024-05-05 LAB — ABO/RH: ABO/RH(D): A NEG

## 2024-05-05 SURGERY — POSTERIOR LUMBAR FUSION 1 LEVEL
Anesthesia: General | Site: Back

## 2024-05-05 MED ORDER — VITAMIN B-12 1000 MCG PO TABS
1000.0000 ug | ORAL_TABLET | Freq: Every day | ORAL | Status: DC
  Administered 2024-05-05 – 2024-05-06 (×2): 1000 ug via ORAL
  Filled 2024-05-05 (×2): qty 1

## 2024-05-05 MED ORDER — PHENYLEPHRINE 80 MCG/ML (10ML) SYRINGE FOR IV PUSH (FOR BLOOD PRESSURE SUPPORT)
PREFILLED_SYRINGE | INTRAVENOUS | Status: AC
Start: 2024-05-05 — End: 2024-05-05
  Filled 2024-05-05: qty 10

## 2024-05-05 MED ORDER — BUPIVACAINE HCL (PF) 0.25 % IJ SOLN
INTRAMUSCULAR | Status: AC
  Filled 2024-05-05: qty 30

## 2024-05-05 MED ORDER — CEFAZOLIN SODIUM-DEXTROSE 2-4 GM/100ML-% IV SOLN
2.0000 g | INTRAVENOUS | Status: AC
  Administered 2024-05-05: 2 g via INTRAVENOUS
  Filled 2024-05-05: qty 100

## 2024-05-05 MED ORDER — BUPIVACAINE HCL (PF) 0.5 % IJ SOLN
INTRAMUSCULAR | Status: AC
  Filled 2024-05-05: qty 30

## 2024-05-05 MED ORDER — VANCOMYCIN HCL 1000 MG IV SOLR
INTRAVENOUS | Status: DC | PRN
  Administered 2024-05-05: 1000 mg

## 2024-05-05 MED ORDER — FLUTICASONE PROPIONATE 50 MCG/ACT NA SUSP
2.0000 | Freq: Every day | NASAL | Status: DC
  Filled 2024-05-05: qty 16

## 2024-05-05 MED ORDER — DEXAMETHASONE SODIUM PHOSPHATE 10 MG/ML IJ SOLN
INTRAMUSCULAR | Status: DC | PRN
  Administered 2024-05-05: 10 mg via INTRAVENOUS

## 2024-05-05 MED ORDER — CEFAZOLIN SODIUM-DEXTROSE 1-4 GM/50ML-% IV SOLN
1.0000 g | Freq: Three times a day (TID) | INTRAVENOUS | Status: AC
  Administered 2024-05-05 (×2): 1 g via INTRAVENOUS
  Filled 2024-05-05 (×2): qty 50

## 2024-05-05 MED ORDER — LACTATED RINGERS IV SOLN: INTRAVENOUS | Status: DC

## 2024-05-05 MED ORDER — PROPOFOL 10 MG/ML IV BOLUS
INTRAVENOUS | Status: AC
  Filled 2024-05-05: qty 20

## 2024-05-05 MED ORDER — LIDOCAINE 2% (20 MG/ML) 5 ML SYRINGE
INTRAMUSCULAR | Status: AC
  Filled 2024-05-05: qty 5

## 2024-05-05 MED ORDER — MUPIROCIN 2 % EX OINT: 1.0000 | TOPICAL_OINTMENT | Freq: Two times a day (BID) | CUTANEOUS | 0 refills | Status: AC

## 2024-05-05 MED ORDER — SODIUM CHLORIDE 0.9% FLUSH: 3.0000 mL | INTRAVENOUS | Status: DC | PRN

## 2024-05-05 MED ORDER — CHLORHEXIDINE GLUCONATE CLOTH 2 % EX PADS: 6.0000 | MEDICATED_PAD | Freq: Once | CUTANEOUS | Status: DC

## 2024-05-05 MED ORDER — ACETAMINOPHEN 650 MG RE SUPP: 650.0000 mg | RECTAL | Status: DC | PRN

## 2024-05-05 MED ORDER — SUGAMMADEX SODIUM 200 MG/2ML IV SOLN
INTRAVENOUS | Status: AC
  Filled 2024-05-05: qty 2

## 2024-05-05 MED ORDER — ADULT MULTIVITAMIN W/MINERALS CH: 1.0000 | ORAL_TABLET | Freq: Every day | ORAL | Status: DC

## 2024-05-05 MED ORDER — OXYCODONE HCL 5 MG PO TABS: 5.0000 mg | ORAL_TABLET | Freq: Once | ORAL | Status: DC | PRN

## 2024-05-05 MED ORDER — PHENOL 1.4 % MT LIQD: 1.0000 | OROMUCOSAL | Status: DC | PRN

## 2024-05-05 MED ORDER — OYSTER SHELL CALCIUM/D3 500-5 MG-MCG PO TABS
1.0000 | ORAL_TABLET | Freq: Every day | ORAL | Status: DC
  Administered 2024-05-06: 1 via ORAL
  Filled 2024-05-05: qty 1

## 2024-05-05 MED ORDER — ONDANSETRON HCL 4 MG/2ML IJ SOLN: 4.0000 mg | Freq: Four times a day (QID) | INTRAMUSCULAR | Status: DC | PRN

## 2024-05-05 MED ORDER — ROCURONIUM BROMIDE 10 MG/ML (PF) SYRINGE
PREFILLED_SYRINGE | INTRAVENOUS | Status: DC | PRN
  Administered 2024-05-05 (×3): 20 mg via INTRAVENOUS
  Administered 2024-05-05: 30 mg via INTRAVENOUS

## 2024-05-05 MED ORDER — FENTANYL CITRATE (PF) 250 MCG/5ML IJ SOLN
INTRAMUSCULAR | Status: AC
  Filled 2024-05-05: qty 5

## 2024-05-05 MED ORDER — VANCOMYCIN HCL 1000 MG IV SOLR
INTRAVENOUS | Status: AC
Start: 2024-05-05 — End: 2024-05-05
  Filled 2024-05-05: qty 20

## 2024-05-05 MED ORDER — ESTRADIOL 0.1 MG/GM VA CREA: 1.0000 | TOPICAL_CREAM | VAGINAL | Status: DC

## 2024-05-05 MED ORDER — FAMOTIDINE 20 MG PO TABS
20.0000 mg | ORAL_TABLET | Freq: Every day | ORAL | Status: DC
  Administered 2024-05-06: 20 mg via ORAL
  Filled 2024-05-05: qty 1

## 2024-05-05 MED ORDER — EPHEDRINE 5 MG/ML INJ
INTRAVENOUS | Status: AC
  Filled 2024-05-05: qty 5

## 2024-05-05 MED ORDER — ACETAMINOPHEN 160 MG/5ML PO SOLN: 960.0000 mg | Freq: Once | ORAL | Status: AC

## 2024-05-05 MED ORDER — SUGAMMADEX SODIUM 200 MG/2ML IV SOLN
INTRAVENOUS | Status: DC | PRN
  Administered 2024-05-05: 200 mg via INTRAVENOUS

## 2024-05-05 MED ORDER — FENTANYL CITRATE (PF) 250 MCG/5ML IJ SOLN
INTRAMUSCULAR | Status: DC | PRN
  Administered 2024-05-05 (×2): 50 ug via INTRAVENOUS

## 2024-05-05 MED ORDER — MENTHOL 3 MG MT LOZG: 1.0000 | LOZENGE | OROMUCOSAL | Status: DC | PRN

## 2024-05-05 MED ORDER — OXYCODONE HCL 5 MG/5ML PO SOLN: 5.0000 mg | Freq: Once | ORAL | Status: DC | PRN

## 2024-05-05 MED ORDER — EPHEDRINE SULFATE-NACL 50-0.9 MG/10ML-% IV SOSY
PREFILLED_SYRINGE | INTRAVENOUS | Status: DC | PRN
  Administered 2024-05-05 (×2): 5 mg via INTRAVENOUS

## 2024-05-05 MED ORDER — CHLORHEXIDINE GLUCONATE 4 % EX SOLN: 1.0000 | CUTANEOUS | 1 refills | Status: DC

## 2024-05-05 MED ORDER — ASPIRIN 81 MG PO TBEC
81.0000 mg | DELAYED_RELEASE_TABLET | Freq: Every day | ORAL | Status: DC
  Administered 2024-05-05 – 2024-05-06 (×2): 81 mg via ORAL
  Filled 2024-05-05 (×2): qty 1

## 2024-05-05 MED ORDER — VITAMIN D 25 MCG (1000 UNIT) PO TABS
25.0000 ug | ORAL_TABLET | Freq: Every day | ORAL | Status: DC
  Administered 2024-05-05 – 2024-05-06 (×2): 25 ug via ORAL
  Filled 2024-05-05: qty 1

## 2024-05-05 MED ORDER — CHLORHEXIDINE GLUCONATE 0.12 % MT SOLN
15.0000 mL | Freq: Once | OROMUCOSAL | Status: AC
Start: 2024-05-05 — End: 2024-05-05
  Administered 2024-05-05: 15 mL via OROMUCOSAL
  Filled 2024-05-05: qty 15

## 2024-05-05 MED ORDER — ORAL CARE MOUTH RINSE: 15.0000 mL | Freq: Once | OROMUCOSAL | Status: AC

## 2024-05-05 MED ORDER — VITAMIN C 500 MG PO TABS
500.0000 mg | ORAL_TABLET | Freq: Every day | ORAL | Status: DC
  Administered 2024-05-05 – 2024-05-06 (×2): 500 mg via ORAL
  Filled 2024-05-05 (×2): qty 1

## 2024-05-05 MED ORDER — PROPOFOL 10 MG/ML IV BOLUS
INTRAVENOUS | Status: DC | PRN
  Administered 2024-05-05: 80 mg via INTRAVENOUS

## 2024-05-05 MED ORDER — FENTANYL CITRATE (PF) 100 MCG/2ML IJ SOLN
INTRAMUSCULAR | Status: AC
  Filled 2024-05-05: qty 2

## 2024-05-05 MED ORDER — LIDOCAINE 2% (20 MG/ML) 5 ML SYRINGE
INTRAMUSCULAR | Status: DC | PRN
  Administered 2024-05-05: 60 mg via INTRAVENOUS

## 2024-05-05 MED ORDER — ALBUMIN HUMAN 5 % IV SOLN: INTRAVENOUS | Status: DC | PRN

## 2024-05-05 MED ORDER — HYDROMORPHONE HCL 1 MG/ML IJ SOLN
1.0000 mg | INTRAMUSCULAR | Status: DC | PRN
  Administered 2024-05-05: 1 mg via INTRAVENOUS
  Filled 2024-05-05: qty 1

## 2024-05-05 MED ORDER — ONDANSETRON HCL 4 MG/2ML IJ SOLN
INTRAMUSCULAR | Status: AC
Start: 2024-05-05 — End: 2024-05-05
  Filled 2024-05-05: qty 2

## 2024-05-05 MED ORDER — BUPIVACAINE HCL (PF) 0.25 % IJ SOLN
INTRAMUSCULAR | Status: DC | PRN
  Administered 2024-05-05: 20 mL

## 2024-05-05 MED ORDER — VANCOMYCIN HCL IN DEXTROSE 1-5 GM/200ML-% IV SOLN
1000.0000 mg | INTRAVENOUS | Status: DC
  Filled 2024-05-05: qty 200

## 2024-05-05 MED ORDER — ONDANSETRON HCL 4 MG/2ML IJ SOLN
INTRAMUSCULAR | Status: DC | PRN
  Administered 2024-05-05: 4 mg via INTRAVENOUS

## 2024-05-05 MED ORDER — PANTOPRAZOLE SODIUM 40 MG PO TBEC
40.0000 mg | DELAYED_RELEASE_TABLET | Freq: Every day | ORAL | Status: DC
  Administered 2024-05-05 – 2024-05-06 (×2): 40 mg via ORAL
  Filled 2024-05-05 (×2): qty 1

## 2024-05-05 MED ORDER — ACETAMINOPHEN 325 MG PO TABS: 650.0000 mg | ORAL_TABLET | ORAL | Status: DC | PRN

## 2024-05-05 MED ORDER — FLEET ENEMA RE ENEM: 1.0000 | ENEMA | Freq: Once | RECTAL | Status: DC | PRN

## 2024-05-05 MED ORDER — BISACODYL 10 MG RE SUPP: 10.0000 mg | Freq: Every day | RECTAL | Status: DC | PRN

## 2024-05-05 MED ORDER — THROMBIN 20000 UNITS EX SOLR
CUTANEOUS | Status: AC
Start: 2024-05-05 — End: 2024-05-05
  Filled 2024-05-05: qty 20000

## 2024-05-05 MED ORDER — POLYETHYLENE GLYCOL 3350 17 G PO PACK: 17.0000 g | PACK | Freq: Every day | ORAL | Status: DC | PRN

## 2024-05-05 MED ORDER — ONDANSETRON HCL 4 MG/2ML IJ SOLN: 4.0000 mg | Freq: Once | INTRAMUSCULAR | Status: DC | PRN

## 2024-05-05 MED ORDER — SODIUM CHLORIDE 0.9 % IV SOLN
250.0000 mL | INTRAVENOUS | Status: DC
Start: 2024-05-05 — End: 2024-05-06
  Administered 2024-05-05: 250 mL via INTRAVENOUS

## 2024-05-05 MED ORDER — ROSUVASTATIN CALCIUM 20 MG PO TABS
20.0000 mg | ORAL_TABLET | Freq: Every day | ORAL | Status: DC
  Administered 2024-05-05: 20 mg via ORAL
  Filled 2024-05-05: qty 1

## 2024-05-05 MED ORDER — ACETAMINOPHEN 500 MG PO TABS
1000.0000 mg | ORAL_TABLET | Freq: Once | ORAL | Status: AC
  Administered 2024-05-05: 1000 mg via ORAL
  Filled 2024-05-05: qty 2

## 2024-05-05 MED ORDER — ROCURONIUM BROMIDE 10 MG/ML (PF) SYRINGE
PREFILLED_SYRINGE | INTRAVENOUS | Status: AC
  Filled 2024-05-05: qty 10

## 2024-05-05 MED ORDER — ONDANSETRON HCL 4 MG PO TABS: 4.0000 mg | ORAL_TABLET | Freq: Four times a day (QID) | ORAL | Status: DC | PRN

## 2024-05-05 MED ORDER — FENTANYL CITRATE (PF) 100 MCG/2ML IJ SOLN
25.0000 ug | INTRAMUSCULAR | Status: DC | PRN
  Administered 2024-05-05: 50 ug via INTRAVENOUS
  Administered 2024-05-05 (×2): 25 ug via INTRAVENOUS

## 2024-05-05 MED ORDER — DEXAMETHASONE SODIUM PHOSPHATE 10 MG/ML IJ SOLN
INTRAMUSCULAR | Status: AC
  Filled 2024-05-05: qty 1

## 2024-05-05 MED ORDER — THROMBIN 20000 UNITS EX SOLR
CUTANEOUS | Status: DC | PRN
  Administered 2024-05-05: 20 mL

## 2024-05-05 MED ORDER — HYDROCODONE-ACETAMINOPHEN 10-325 MG PO TABS
1.0000 | ORAL_TABLET | ORAL | Status: DC | PRN
  Administered 2024-05-05 – 2024-05-06 (×5): 1 via ORAL
  Filled 2024-05-05 (×5): qty 1

## 2024-05-05 MED ORDER — SODIUM CHLORIDE 0.9% FLUSH
3.0000 mL | Freq: Two times a day (BID) | INTRAVENOUS | Status: DC
  Administered 2024-05-05 (×2): 3 mL via INTRAVENOUS

## 2024-05-05 MED ORDER — DIAZEPAM 5 MG PO TABS
5.0000 mg | ORAL_TABLET | Freq: Four times a day (QID) | ORAL | Status: DC | PRN
  Administered 2024-05-05: 5 mg via ORAL
  Filled 2024-05-05: qty 1

## 2024-05-05 MED ORDER — 0.9 % SODIUM CHLORIDE (POUR BTL) OPTIME
TOPICAL | Status: DC | PRN
  Administered 2024-05-05: 1000 mL

## 2024-05-05 MED ORDER — LORATADINE 10 MG PO TABS
10.0000 mg | ORAL_TABLET | Freq: Every day | ORAL | Status: DC
  Administered 2024-05-05: 10 mg via ORAL
  Filled 2024-05-05: qty 1

## 2024-05-05 SURGICAL SUPPLY — 50 items
BAG COUNTER SPONGE SURGICOUNT (BAG) ×1 IMPLANT
BENZOIN TINCTURE PRP APPL 2/3 (GAUZE/BANDAGES/DRESSINGS) ×1 IMPLANT
BLADE BONE MILL MEDIUM (MISCELLANEOUS) ×1 IMPLANT
BLADE CLIPPER SURG (BLADE) IMPLANT
BUR MATCHSTICK NEURO 3.0 LAGG (BURR) ×1 IMPLANT
CAGE EXP CATALYFT SHORT 9X22.5 (Cage) IMPLANT
CANISTER SUCTION 3000ML PPV (SUCTIONS) ×1 IMPLANT
CNTNR URN SCR LID CUP LEK RST (MISCELLANEOUS) ×1 IMPLANT
COVER BACK TABLE 60X90IN (DRAPES) ×1 IMPLANT
DERMABOND ADVANCED .7 DNX12 (GAUZE/BANDAGES/DRESSINGS) ×1 IMPLANT
DRAPE C-ARM 42X72 X-RAY (DRAPES) ×2 IMPLANT
DRAPE HALF SHEET 40X57 (DRAPES) IMPLANT
DRAPE LAPAROTOMY 100X72X124 (DRAPES) ×1 IMPLANT
DRAPE SURG 17X23 STRL (DRAPES) ×4 IMPLANT
DRSG OPSITE 4X5.5 SM (GAUZE/BANDAGES/DRESSINGS) IMPLANT
DRSG OPSITE POSTOP 4X6 (GAUZE/BANDAGES/DRESSINGS) ×1 IMPLANT
DURAPREP 26ML APPLICATOR (WOUND CARE) ×1 IMPLANT
ELECTRODE REM PT RTRN 9FT ADLT (ELECTROSURGICAL) ×1 IMPLANT
EVACUATOR 1/8 PVC DRAIN (DRAIN) IMPLANT
GAUZE 4X4 16PLY ~~LOC~~+RFID DBL (SPONGE) IMPLANT
GAUZE SPONGE 4X4 12PLY STRL (GAUZE/BANDAGES/DRESSINGS) IMPLANT
GLOVE BIO SURGEON STRL SZ 6 (GLOVE) ×1 IMPLANT
GLOVE BIOGEL PI IND STRL 6.5 (GLOVE) ×1 IMPLANT
GLOVE BIOGEL PI IND STRL 7.5 (GLOVE) IMPLANT
GLOVE ECLIPSE 9.0 STRL (GLOVE) ×2 IMPLANT
GLOVE EXAM NITRILE XL STR (GLOVE) IMPLANT
GLOVE SKINSENSE STRL SZ7.0 (GLOVE) IMPLANT
GOWN STRL REUS W/ TWL LRG LVL3 (GOWN DISPOSABLE) IMPLANT
GOWN STRL REUS W/ TWL XL LVL3 (GOWN DISPOSABLE) ×2 IMPLANT
GOWN STRL REUS W/TWL 2XL LVL3 (GOWN DISPOSABLE) IMPLANT
KIT BASIN OR (CUSTOM PROCEDURE TRAY) ×1 IMPLANT
KIT TURNOVER KIT B (KITS) ×1 IMPLANT
NDL HYPO 22X1.5 SAFETY MO (MISCELLANEOUS) ×1 IMPLANT
NEEDLE HYPO 22X1.5 SAFETY MO (MISCELLANEOUS) ×1 IMPLANT
NS IRRIG 1000ML POUR BTL (IV SOLUTION) ×1 IMPLANT
PACK LAMINECTOMY NEURO (CUSTOM PROCEDURE TRAY) ×1 IMPLANT
PUTTY GRAFTON DBF 6CC W/DELIVE (Putty) IMPLANT
ROD PREBENT 30MM LUMBAR (Rod) IMPLANT
SCREW SET SOLERA TI5.5 (Screw) IMPLANT
SCREW SOLERA 45X5.5XMA NS SPNE (Screw) IMPLANT
SPIKE FLUID TRANSFER (MISCELLANEOUS) ×1 IMPLANT
SPONGE SURGIFOAM ABS GEL 100 (HEMOSTASIS) ×1 IMPLANT
STRIP CLOSURE SKIN 1/2X4 (GAUZE/BANDAGES/DRESSINGS) ×2 IMPLANT
SUT VIC AB 0 CT1 18XCR BRD8 (SUTURE) ×2 IMPLANT
SUT VIC AB 2-0 CT1 18 (SUTURE) ×1 IMPLANT
SUT VIC AB 3-0 SH 8-18 (SUTURE) ×2 IMPLANT
TOWEL GREEN STERILE (TOWEL DISPOSABLE) ×1 IMPLANT
TOWEL GREEN STERILE FF (TOWEL DISPOSABLE) ×1 IMPLANT
TRAY FOLEY MTR SLVR 16FR STAT (SET/KITS/TRAYS/PACK) ×1 IMPLANT
WATER STERILE IRR 1000ML POUR (IV SOLUTION) ×1 IMPLANT

## 2024-05-05 NOTE — Plan of Care (Signed)

## 2024-05-05 NOTE — Transfer of Care (Signed)
 Immediate Anesthesia Transfer of Care Note  Patient: Joan Ritter  Procedure(s) Performed: POSTERIOR LUMBAR FUSION 1 LEVEL (Back)  Patient Location: PACU  Anesthesia Type:General  Level of Consciousness: awake, alert , patient cooperative, and responds to stimulation  Airway & Oxygen Therapy: Patient Spontanous Breathing and Patient connected to nasal cannula oxygen  Post-op Assessment: Report given to RN, Post -op Vital signs reviewed and stable, and Patient moving all extremities X 4  Post vital signs: Reviewed and stable  Last Vitals:  Vitals Value Taken Time  BP 120/68 05/05/24 11:00  Temp 36.6 C 05/05/24 10:58  Pulse 78 05/05/24 11:02  Resp 14 05/05/24 11:02  SpO2 92 % 05/05/24 11:02  Vitals shown include unfiled device data.  Last Pain:  Vitals:   05/05/24 0645  TempSrc:   PainSc: 0-No pain      Patients Stated Pain Goal: 0 (05/05/24 [id number])  Complications: No notable events documented.

## 2024-05-05 NOTE — Anesthesia Procedure Notes (Signed)
 Procedure Name: Intubation Date/Time: 05/05/2024 8:18 AM  Performed by: Jolynn Mage, CRNAPre-anesthesia Checklist: Patient identified, Patient being monitored, Timeout performed, Emergency Drugs available and Suction available Patient Re-evaluated:Patient Re-evaluated prior to induction Oxygen Delivery Method: Circle System Utilized Preoxygenation: Pre-oxygenation with 100% oxygen Induction Type: IV induction Ventilation: Mask ventilation without difficulty Laryngoscope Size: Miller and 2 Grade View: Grade I Tube type: Oral Tube size: 6.5 mm Number of attempts: 1 Airway Equipment and Method: Stylet Placement Confirmation: ETT inserted through vocal cords under direct vision, positive ETCO2 and breath sounds checked- equal and bilateral Secured at: 21 cm Tube secured with: Tape Dental Injury: Teeth and Oropharynx as per pre-operative assessment

## 2024-05-05 NOTE — Telephone Encounter (Unsigned)
 Copied from CRM [id number]. Topic: General - Other >> May 05, 2024  1:01 PM Suzen RAMAN wrote: Reason for CRM: Patient husband would like to inform provider that the patients back surgery today 05/05/24 went well.

## 2024-05-05 NOTE — Progress Notes (Signed)
 Orthopedic Tech Progress Note Patient Details:  Joan Ritter [birth year] [id number]  Patient has back brace   Patient ID: Joan Ritter, female   DOB: 12/16/1945, 78 y.o.   MRN: [id number]  Joan Ritter Pac 05/05/2024, 12:39 PM

## 2024-05-05 NOTE — Brief Op Note (Signed)
 05/05/2024  10:36 AM  PATIENT:  Joan Ritter  78 y.o. female  PRE-OPERATIVE DIAGNOSIS:  Degenerative spondylolisthesis  POST-OPERATIVE DIAGNOSIS:  Degenerative spondylolisthesis  PROCEDURE:  Procedure(s) with comments: POSTERIOR LUMBAR FUSION 1 LEVEL (N/A) - PLIF - L4-L5 - Posterior Lateral and Interbody fusion  SURGEON:  Surgeons and Role:    DEWAINE Louis Shove, MD - Primary  PHYSICIAN ASSISTANT:   ASSISTANTSBETHA Jennetta PIETY   ANESTHESIA:   general  EBL:  75 mL   BLOOD ADMINISTERED:none  DRAINS: none   LOCAL MEDICATIONS USED:  MARCAINE      SPECIMEN:  No Specimen  DISPOSITION OF SPECIMEN:  N/A  COUNTS:  YES  TOURNIQUET:  * No tourniquets in log *  DICTATION: .Dragon Dictation  PLAN OF CARE: Admit for overnight observation  PATIENT DISPOSITION:  PACU - hemodynamically stable.   Delay start of Pharmacological VTE agent (>24hrs) due to surgical blood loss or risk of bleeding: yes

## 2024-05-05 NOTE — Op Note (Signed)
 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  L4-L5 ponte osteotomies for reduction of deformity  L4-5 posterior lumbar interbody fusion utilizing interbody cages, local harvested autograft, and morselized allograft  L4-5 posterior right arthrodesis utilizing nonsegmental pedicle screw fixation and local autograft.  Surgeon:Kamori Kitchens A.Read Bonelli, M.D.  Asst. Surgeon: Jennetta, NP  Anesthesia: General  Indication: 78 year old female with severe back and bilateral lower extremity symptoms failing conservative manage.  Workup demonstrates evidence of an unstable grade 2 degenerative spondylolisthesis with severe stenosis.  Patient presents now for decompression and fusion in hopes of improving her symptoms.  Operative note: After induction anesthesia, patient positioned prone on the Wilson frame and appropriately padded.  Lumbar region prepped and draped sterilely.  Incision made overlying L4-5.  Dissection performed bilaterally.  Retractor placed.  Fluoroscopy used.  Levels confirmed.  Decompressive laminotomies and facetectomies were then performed bilaterally using Leksell rongeurs, Kerrison rongeurs and high-speed drill to remove the inferior two thirds of the lamina of L4, the entire inferior facet and pars interarticularis of L4, the superior articulating process of L5 was resected as was the superior aspect of the L5 lamina.  Ligament flavum elevated and resected.  Wide foraminotomies completed along the course the exiting L4 and L5 nerve roots bilaterally.  Further lateral facetectomies were performed to allow for mobilization of the L4-5 motion segment and completing ponte osteotomies.   Bilateral discectomy was then performed at L4-5.  Disc base then prepared for interbody fusion.  Disc base cleaned of all soft tissue and endplates prepared.  With the distractor placed patient's right side disc base was inspected and a 9 mm Medtronic expandable cage was impacted in the place and expanded.  Distractor removed patient's right side.  Disc base.  On the right side.  Morselized autograft packed to the interspace.  A second cage was then impacted into place and expanded.  Pedicles of L4 and L5 were identified using surface landmarks and intraoperative fluoroscopy superficial bone overlying the pedicle was then removed using high-speed drill.  Pedicle was then probed using a pedicle awl.  Each pedicle all track was then probed and found to be solidly within the bone.  Each pedicle all track was then tapped with a screw tap.  Screw triple was probed and found to be solidly within the bone.  5.5 mm Solera screws from Medtronic were placed bilaterally at L4 and L5.  Final images reveal good position of the cages and the hardware with proper level with complete reduction of her deformity.  Each cage was then packed with demineralized bone fibers.  Gelfoam was placed over the laminotomy defects.  Transverse processes were decorticated.  Morselized autograft was packed posterolaterally.  Short segment titanium rod placed over the screws at L4 and L5.  Locking caps placed over the screws.  Locking caps then engaged with the construct under compression.  Vancomycin  powder placed the deep wound space.  Wounds then closed in layers with Vicryl sutures.  Steri-Strips and sterile dressing were applied.  No apparent complications.  Patient tolerated the procedure well and she returns to the recovery room postop.

## 2024-05-05 NOTE — H&P (Signed)
 Joan Ritter is an 78 y.o. female.   Chief Complaint: Back pain HPI: 78 year old female with progressive worsening lumbar pain with radiation of the lower extremities worsening with standing and prolonged walking also worsen with lying flat.  Patient with evidence of unstable degenerative spondylolisthesis with severe stenosis at L4-5.  She has failed conservative management.  She presents now for L4-5 decompression and fusion in hopes improving her symptoms.  Past Medical History:  Diagnosis Date   Allergic rhinitis    seasonal   Arthritis    Cataract 2014   Routine   Elevated TSH    Esophagitis    GERD (gastroesophageal reflux disease)    History of Helicobacter pylori infection 10/23/2010   Hypercholesterolemia    Osteopenia    Stroke (HCC) 2001   Mini stroke   TIA (transient ischemic attack) 10/24/1999    Past Surgical History:  Procedure Laterality Date   BREAST BIOPSY Right 2000   neg   BREAST BIOPSY Right 05/18/2017   adipose tissue, coil marker   BREAST EXCISIONAL BIOPSY Right 1970   NEG   CATARACT EXTRACTION Bilateral    COLONOSCOPY  2005   normal    ESOPHAGOGASTRODUODENOSCOPY  2012   normal    HEMORRHOID SURGERY  1980s   INGUINAL HERNIA REPAIR Left     Family History  Problem Relation Age of Onset   Lymphoma Father    Cancer Father    CVA Mother        h/o cerebral hemorrhage   Stroke Mother    Brain cancer Brother    Cardiomyopathy Sister        enlarged heart, died age 107 - sudden death   Breast cancer Maternal Aunt        x2   Cancer Paternal Aunt    Cancer Brother    Heart disease Sister    Cancer Brother    Cancer Paternal Aunt    Colon cancer Neg Hx    Social History:  reports that she has never smoked. She has never used smokeless tobacco. She reports that she does not drink alcohol and does not use drugs.  Allergies:  Allergies  Allergen Reactions   Gabapentin Other (See Comments)    High dosage - causes hallucination      Medications Prior to Admission  Medication Sig Dispense Refill   ascorbic acid  (VITAMIN C ) 500 MG tablet Take 500 mg by mouth daily.     aspirin  81 MG tablet Take 81 mg by mouth daily.     Calcium  Carbonate-Vit D-Min 600-400 MG-UNIT TABS Take 1 tablet by mouth daily.     Cholecalciferol  (D3-1000 PO) Take 1 tablet by mouth daily.     clopidogrel  (PLAVIX ) 75 MG tablet Take 1 tablet (75 mg total) by mouth daily. 90 tablet 1   cyanocobalamin  (VITAMIN B12) 1000 MCG tablet Take 1,000 mcg by mouth daily.     estradiol  (ESTRACE ) 0.1 MG/GM vaginal cream Place 1 Applicatorful vaginally 3 (three) times a week.     famotidine  (PEPCID ) 20 MG tablet Take 1 tablet (20 mg total) by mouth daily. 90 tablet 3   fluticasone  (FLONASE ) 50 MCG/ACT nasal spray USE 2 SPRAYS EACH NOSTRIL EVERY DAY 16 g 5   Multiple Vitamin (MULTIVITAMIN) tablet Take 1 tablet by mouth in the morning and at bedtime.     pantoprazole  (PROTONIX ) 40 MG tablet Take 1 tablet (40 mg total) by mouth daily. 90 tablet 3   rosuvastatin  (CRESTOR ) 20 MG tablet Take  1 tablet (20 mg total) by mouth daily. 90 tablet 3   cetirizine (ZYRTEC) 10 MG tablet Take 10 mg by mouth daily as needed for allergies.     gentamicin ointment (GARAMYCIN) 0.1 % Apply 1 Application topically as needed.      Results for orders placed or performed during the hospital encounter of 05/05/24 (from the past 48 hours)  Type and screen     Status: None   Collection Time: 05/05/24  6:20 AM  Result Value Ref Range   ABO/RH(D) A NEG    Antibody Screen NEG    Sample Expiration      05/08/2024,2359 Performed at [facility], [address], [place], [place] [address]   ABO/Rh     Status: None   Collection Time: 05/05/24  6:25 AM  Result Value Ref Range   ABO/RH(D)      A NEG Performed at [facility], [address], [place], [place] [address]    No results found.  Pertinent items noted in HPI and remainder of comprehensive ROS otherwise  negative.  Blood pressure 129/64, pulse 60, temperature 98.4 F (36.9 C), temperature source Oral, resp. rate 18, height 5' 2.5 (1.588 m), weight 55.8 kg, SpO2 99%.  Patient is awake and alert.  She is oriented and appropriate.  Speech is fluent.  Judgment insight are intact.  Cranial nerve function normal 5.  Motor examination grossly intact motor strength bilateral upper and sensory examination with diminished sensation from L5 distally.  Deep tendon reflexes hypoactive but rheumatoid surfaces are absent bilaterally.  Gait somewhat antalgic.  Examination head ears eyes nose throat is unremarkable her chest and abdomen are benign.  Extremities are free from injury or deformity. Assessment/Plan L4-5 degenerative spondylolisthesis with severe stenosis and neurogenic claudication.  Plan bilateral L4-5 decompressive laminotomies and foraminotomies followed by posterior lumbar interbody fusion utilizing interbody cages, morselized, and augment with posterior arthrodesis utilizing nonsegmental pedicle screw fixation and local autograft.  Risks and benefits were explained.  Patient wishes to proceed.  Joan Ritter 05/05/2024, 7:55 AM

## 2024-05-05 NOTE — Anesthesia Postprocedure Evaluation (Signed)
 Anesthesia Post Note  Patient: Joan Ritter  Procedure(s) Performed: POSTERIOR LUMBAR FUSION 1 LEVEL (Back)     Patient location during evaluation: PACU Anesthesia Type: General Level of consciousness: awake and alert Pain management: pain level controlled Vital Signs Assessment: post-procedure vital signs reviewed and stable Respiratory status: spontaneous breathing, nonlabored ventilation, respiratory function stable and patient connected to nasal cannula oxygen Cardiovascular status: blood pressure returned to baseline and stable Postop Assessment: no apparent nausea or vomiting Anesthetic complications: no   No notable events documented.  Last Vitals:  Vitals:   05/05/24 1217 05/05/24 1539  BP: (!) 103/53 (!) 110/54  Pulse: 72 79  Resp: 20 19  Temp: 37.2 C 36.6 C  SpO2: 99% 99%    Last Pain:  Vitals:   05/05/24 1539  TempSrc: Oral  PainSc:                  [name] Ade

## 2024-05-05 NOTE — Telephone Encounter (Signed)
 Please call and thank him for calling and tell him to let us  know if she needs anything.

## 2024-05-06 DIAGNOSIS — M48062 Spinal stenosis, lumbar region with neurogenic claudication: Secondary | ICD-10-CM | POA: Diagnosis not present

## 2024-05-06 DIAGNOSIS — M4316 Spondylolisthesis, lumbar region: Secondary | ICD-10-CM | POA: Diagnosis not present

## 2024-05-06 DIAGNOSIS — Z79899 Other long term (current) drug therapy: Secondary | ICD-10-CM | POA: Diagnosis not present

## 2024-05-06 DIAGNOSIS — Z7902 Long term (current) use of antithrombotics/antiplatelets: Secondary | ICD-10-CM | POA: Diagnosis not present

## 2024-05-06 DIAGNOSIS — Z8673 Personal history of transient ischemic attack (TIA), and cerebral infarction without residual deficits: Secondary | ICD-10-CM | POA: Diagnosis not present

## 2024-05-06 DIAGNOSIS — Z7982 Long term (current) use of aspirin: Secondary | ICD-10-CM | POA: Diagnosis not present

## 2024-05-06 DIAGNOSIS — I10 Essential (primary) hypertension: Secondary | ICD-10-CM | POA: Diagnosis not present

## 2024-05-06 MED ORDER — METHOCARBAMOL 500 MG PO TABS: 500.0000 mg | ORAL_TABLET | Freq: Four times a day (QID) | ORAL | 1 refills | Status: DC | PRN

## 2024-05-06 MED ORDER — HYDROCODONE-ACETAMINOPHEN 10-325 MG PO TABS: 1.0000 | ORAL_TABLET | ORAL | 0 refills | Status: DC | PRN

## 2024-05-06 NOTE — Discharge Instructions (Addendum)
 Wound Care Keep incision covered and dry until post op day 3. You may remove the Honeycomb dressing on post op day 3. Leave steri-strips on back.  They will fall off by themselves. Do not put any creams, lotions, or ointments on incision. You are fine to shower. Let water run over incision and pat dry.  Activity Activity Walk each and every day, increasing distance each day. No lifting greater than 8 lbs.  No lifting no bending no twisting no driving , You can ride as a passenger Locally If provided with back brace, wear when out of bed.  It is not necessary to wear brace in bed.  Diet Resume your normal diet.   Return to Work Will be discussed at your follow up appointment.  Call Your Doctor If Any of These Occur Redness, drainage, or swelling at the wound.  Temperature greater than 101 degrees. Severe pain not relieved by pain medication. Incision starts to come apart.  Follow Up Appt Call [phone] if you have one or any problem.

## 2024-05-06 NOTE — Discharge Summary (Signed)
 Physician Discharge Summary  Patient ID: Joan Ritter MRN: [id number] DOB/AGE: [birth year] 78 y.o.  Admit date: 05/05/2024 Discharge date: 05/06/2024  Admission Diagnoses:  Discharge Diagnoses:  Principal Problem:   Degenerative spondylolisthesis   Discharged Condition: good  Hospital Course: Patient admitted to the hospital where she underwent uncomplicated L4-5 decompression and fusion.  Postoperatively doing very well.  Preoperative back and lower extremity pain much improved.  Standing ambulating and voiding without difficulty.  Ready for discharge home.  Consults:   Significant Diagnostic Studies:   Treatments:   Discharge Exam: Blood pressure (!) 101/42, pulse 61, temperature 97.9 F (36.6 C), temperature source Oral, resp. rate 16, height 5' 2.5 (1.588 m), weight 55.8 kg, SpO2 98%. Awake and alert.  Oriented and appropriate.  Speech is fluent.  Judgment insight are intact.  Cranial nerve function normal by.  Motor and sensory function extremities normal.  Wound clean and dry.  Chest and abdomen benign.  Disposition: Discharge disposition: 01-Home or Self Care        Allergies as of 05/06/2024       Reactions   Gabapentin Other (See Comments)   High dosage - causes hallucination         Medication List     TAKE these medications    ascorbic acid  500 MG tablet Commonly known as: VITAMIN C  Take 500 mg by mouth daily.   aspirin  81 MG tablet Take 81 mg by mouth daily.   Calcium  Carbonate-Vit D-Min 600-400 MG-UNIT Tabs Take 1 tablet by mouth daily.   cetirizine 10 MG tablet Commonly known as: ZYRTEC Take 10 mg by mouth daily as needed for allergies.   chlorhexidine  4 % external liquid Commonly known as: HIBICLENS  Apply 15 mLs (1 Application total) topically as directed for 30 doses. Use as directed daily for 5 days every other week for 6 weeks.   clopidogrel  75 MG tablet Commonly known as: PLAVIX  Take 1 tablet (75 mg total) by mouth daily.    cyanocobalamin  1000 MCG tablet Commonly known as: VITAMIN B12 Take 1,000 mcg by mouth daily.   D3-1000 PO Take 1 tablet by mouth daily.   estradiol  0.1 MG/GM vaginal cream Commonly known as: ESTRACE  Place 1 Applicatorful vaginally 3 (three) times a week.   famotidine  20 MG tablet Commonly known as: PEPCID  Take 1 tablet (20 mg total) by mouth daily.   fluticasone  50 MCG/ACT nasal spray Commonly known as: FLONASE  USE 2 SPRAYS EACH NOSTRIL EVERY DAY   gentamicin ointment 0.1 % Commonly known as: GARAMYCIN Apply 1 Application topically as needed.   HYDROcodone -acetaminophen  10-325 MG tablet Commonly known as: NORCO Take 1 tablet by mouth every 4 (four) hours as needed for moderate pain (pain score 4-6) ((score 4 to 6)).   methocarbamol  500 MG tablet Commonly known as: ROBAXIN  Take 1 tablet (500 mg total) by mouth every 6 (six) hours as needed for muscle spasms.   multivitamin tablet Take 1 tablet by mouth in the morning and at bedtime.   mupirocin  ointment 2 % Commonly known as: BACTROBAN  Place 1 Application into the nose 2 (two) times daily for 60 doses. Use as directed 2 times daily for 5 days every other week for 6 weeks.   pantoprazole  40 MG tablet Commonly known as: PROTONIX  Take 1 tablet (40 mg total) by mouth daily.   rosuvastatin  20 MG tablet Commonly known as: CRESTOR  Take 1 tablet (20 mg total) by mouth daily.  Durable Medical Equipment  (From admission, onward)           Start     Ordered   05/05/24 1208  DME Walker rolling  Once       Question:  Patient needs a walker to treat with the following condition  Answer:  Degenerative spondylolisthesis   05/05/24 1207   05/05/24 1208  DME 3 n 1  Once        05/05/24 1207             Signed: Victory A Alyah Boehning 05/06/2024, 10:38 AM

## 2024-05-06 NOTE — Evaluation (Signed)
 Occupational Therapy Evaluation Patient Details Name: Joan Ritter MRN: [id number] DOB: Mar 12, 1946 Today's Date: 05/06/2024   History of Present Illness   Pt is a 78 y/o female presenting on 7/14 for L4-5 posterior lumbar decompression and fusion. PMH includes: CVA, arthritis, cataracts.     Clinical Impressions Patient admitted for above and presents with problem list below.  PTA pt was independent and driving. Patient was educated on brace mgmt and wear schedule, back precautions, ADL compensatory techniques, AE/DME, mobility progression, safety and recommendations.  Today, pt demonstrated ability to complete bed mobility with modified independence, transfers with modified independence, functional mobility with modified independence (no AD but at times reaching for UE support), and ADLs with modified independence after education of compensatory techniques.  At discharge, pt will have support from spouse as needed.  Based on performance today, no further OT needs identified.  OT will sign off.       If plan is discharge home, recommend the following:   Assistance with cooking/housework;Assist for transportation     Functional Status Assessment         Equipment Recommendations   None recommended by OT     Recommendations for Other Services         Precautions/Restrictions   Precautions Precautions: Back Precaution Booklet Issued: Yes (comment) Recall of Precautions/Restrictions: Intact Precaution/Restrictions Comments: reviewed with pt Required Braces or Orthoses: Spinal Brace Spinal Brace: Lumbar corset;Applied in sitting position Restrictions Weight Bearing Restrictions Per Provider Order: No     Mobility Bed Mobility Overal bed mobility: Modified Independent             General bed mobility comments: after education of technique, from flat bed without rails    Transfers Overall transfer level: Modified independent                         Balance Overall balance assessment: No apparent balance deficits (not formally assessed)                                         ADL either performed or assessed with clinical judgement   ADL Overall ADL's : Modified independent                                       General ADL Comments: pt educated on compesnatory techniques for ADLs due to back precautions, pt able to return demonstrate without assist. Spouse is able to assist as needed.     Vision   Vision Assessment?: No apparent visual deficits     Perception         Praxis         Pertinent Vitals/Pain Pain Assessment Pain Assessment: Faces Faces Pain Scale: Hurts little more Pain Location: back- incisonal Pain Descriptors / Indicators: Operative site guarding Pain Intervention(s): Limited activity within patient's tolerance, Monitored during session, Repositioned     Extremity/Trunk Assessment Upper Extremity Assessment Upper Extremity Assessment: Overall WFL for tasks assessed   Lower Extremity Assessment Lower Extremity Assessment: Defer to PT evaluation   Cervical / Trunk Assessment Cervical / Trunk Assessment: Back Surgery   Communication Communication Communication: No apparent difficulties   Cognition Arousal: Alert Behavior During Therapy: WFL for tasks assessed/performed Cognition: No apparent impairments  Following commands: Intact       Cueing  General Comments   Cueing Techniques: Verbal cues      Exercises     Shoulder Instructions      Home Living Family/patient expects to be discharged to:: Private residence Living Arrangements: Spouse/significant other;Children Available Help at Discharge: Family Type of Home: House Home Access: Stairs to enter [employer] of Steps: 3 Entrance Stairs-Rails: Right Home Layout: Two level;Bed/bath upstairs;Full bath on main level Alternate Level  Stairs-Number of Steps: flight Alternate Level Stairs-Rails: Right Bathroom Shower/Tub: Walk-in shower   Bathroom Toilet: Handicapped height     Home Equipment: Agricultural consultant (2 wheels);BSC/3in1;Shower seat;Shower seat - built in          Prior Functioning/Environment Prior Level of Function : Independent/Modified Independent;Driving               ADLs Comments: ind    OT Problem List: Pain;Decreased knowledge of use of DME or AE   OT Treatment/Interventions:        OT Goals(Current goals can be found in the care plan section)   Acute Rehab OT Goals Patient Stated Goal: home OT Goal Formulation: With patient   OT Frequency:       Co-evaluation              AM-PAC OT 6 Clicks Daily Activity     Outcome Measure Help from another person eating meals?: None Help from another person taking care of personal grooming?: None Help from another person toileting, which includes using toliet, bedpan, or urinal?: None Help from another person bathing (including washing, rinsing, drying)?: None Help from another person to put on and taking off regular upper body clothing?: None Help from another person to put on and taking off regular lower body clothing?: None 6 Click Score: 24   End of Session Equipment Utilized During Treatment: Back brace Nurse Communication: Mobility status  Activity Tolerance: Patient tolerated treatment well Patient left: in chair;with call bell/phone within reach;with family/visitor present  OT Visit Diagnosis: Other abnormalities of gait and mobility (R26.89);Pain Pain - part of body:  (back)                Time: 9148-9086 OT Time Calculation (min): 22 min Charges:  OT General Charges $OT Visit: 1 Visit OT Evaluation $OT Eval Low Complexity: 1 Low  Etta NOVAK, OT Acute Rehabilitation Services Office [phone] Secure Chat Preferred    Etta GORMAN Hope 05/06/2024, 9:34 AM

## 2024-05-06 NOTE — Evaluation (Signed)
 Physical Therapy Evaluation Patient Details Name: Joan Ritter MRN: [id number] DOB: [birth year] Today's Date: 05/06/2024  History of Present Illness  Pt is a 78 y/o female presenting on 7/14 for L4-5 posterior lumbar decompression and fusion. PMH includes: CVA, arthritis, cataracts.  Clinical Impression  PT eval complete. Pt mod I bed mobility and transfers. Supervision ambulation 500' with RW. CGA ascend/descend 12 steps with R rail. All education complete. Pt will have 24-hour assist at home. No follow up PT services indicated. No DME needs. PT signing off.        If plan is discharge home, recommend the following: Assistance with cooking/housework;Assist for transportation;Help with stairs or ramp for entrance   Can travel by private vehicle        Equipment Recommendations None recommended by PT  Recommendations for Other Services       Functional Status Assessment Patient has had a recent decline in their functional status and demonstrates the ability to make significant improvements in function in a reasonable and predictable amount of time.     Precautions / Restrictions Precautions Precautions: Back Recall of Precautions/Restrictions: Intact Precaution/Restrictions Comments: reviewed 3/3 back precautions. Handout provided by OT. Required Braces or Orthoses: Spinal Brace Spinal Brace: Lumbar corset;Applied in sitting position Restrictions Other Position/Activity Restrictions: Reviewed brace wear schedule      Mobility  Bed Mobility Overal bed mobility: Modified Independent                  Transfers Overall transfer level: Modified independent                      Ambulation/Gait Ambulation/Gait assistance: Supervision Gait Distance (Feet): 500 Feet Assistive device: Rolling walker (2 wheels) Gait Pattern/deviations: Step-through pattern Gait velocity: WFL Gait velocity interpretation: >2.62 ft/sec, indicative of community ambulatory    General Gait Details: steady gait with RW  Stairs Stairs: Yes Stairs assistance: Contact guard assist Stair Management: One rail Right, Step to pattern, Forwards Number of Stairs: 12    Wheelchair Mobility     Tilt Bed    Modified Rankin (Stroke Patients Only)       Balance Overall balance assessment: No apparent balance deficits (not formally assessed)                                           Pertinent Vitals/Pain Pain Assessment Pain Assessment: Faces Faces Pain Scale: Hurts little more Pain Location: back Pain Descriptors / Indicators: Sore Pain Intervention(s): Monitored during session    Home Living Family/patient expects to be discharged to:: Private residence Living Arrangements: Spouse/significant other;Children Available Help at Discharge: Family Type of Home: House Home Access: Stairs to enter Entrance Stairs-Rails: Right Entrance Stairs-Number of Steps: 3 Alternate Level Stairs-Number of Steps: flight Home Layout: Two level;Bed/bath upstairs;Full bath on main level Home Equipment: Rolling Walker (2 wheels);BSC/3in1;Shower seat;Shower seat - built in      Prior Function Prior Level of Function : Independent/Modified Independent;Driving             Mobility Comments: no AD at baseline ADLs Comments: ind     Extremity/Trunk Assessment   Upper Extremity Assessment Upper Extremity Assessment: Overall WFL for tasks assessed    Lower Extremity Assessment Lower Extremity Assessment: Overall WFL for tasks assessed    Cervical / Trunk Assessment Cervical / Trunk Assessment: Back Surgery  Communication   Communication  Communication: No apparent difficulties    Cognition Arousal: Alert Behavior During Therapy: WFL for tasks assessed/performed                             Following commands: Intact       Cueing Cueing Techniques: Verbal cues     General Comments      Exercises     Assessment/Plan     PT Assessment Patient does not need any further PT services  PT Problem List         PT Treatment Interventions      PT Goals (Current goals can be found in the Care Plan section)  Acute Rehab PT Goals Patient Stated Goal: home PT Goal Formulation: All assessment and education complete, DC therapy    Frequency       Co-evaluation               AM-PAC PT 6 Clicks Mobility  Outcome Measure Help needed turning from your back to your side while in a flat bed without using bedrails?: None Help needed moving from lying on your back to sitting on the side of a flat bed without using bedrails?: None Help needed moving to and from a bed to a chair (including a wheelchair)?: None Help needed standing up from a chair using your arms (e.g., wheelchair or bedside chair)?: None Help needed to walk in hospital room?: A Little Help needed climbing 3-5 steps with a railing? : A Little 6 Click Score: 22    End of Session Equipment Utilized During Treatment: Back brace;Gait belt Activity Tolerance: Patient tolerated treatment well Patient left: in chair;with chair alarm set;with family/visitor present Nurse Communication: Mobility status PT Visit Diagnosis: Difficulty in walking, not elsewhere classified (R26.2)    Time: 9077-9057 PT Time Calculation (min) (ACUTE ONLY): 20 min   Charges:   PT Evaluation $PT Eval Moderate Complexity: 1 Mod   PT General Charges $$ ACUTE PT VISIT: 1 Visit         [name], PT  Office # [phone]   [name] 05/06/2024, 10:27 AM

## 2024-05-06 NOTE — Progress Notes (Signed)
 Patient alert and oriented, mae's well, voiding adequate amount of urine, swallowing without difficulty, no c/o pain at time of discharge. Patient discharged home with family. Script and discharged instructions given to patient. Patient and family stated understanding of instructions given. Room was checked and accounted for all patient's belongings; discharge instructions concerning her medications, incision care, follow up appointment and when to call the doctor as needed were all discussed with patient by RN and she expressed understanding on the instructions given.

## 2024-05-06 NOTE — Care Management Obs Status (Signed)
 MEDICARE OBSERVATION STATUS NOTIFICATION   Patient Details  Name: KASMIRA CACIOPPO MRN: [id number] Date of Birth: [birth year]   Medicare Observation Status Notification Given:  Yes    Jon Cruel 05/06/2024, 8:47 AM

## 2024-05-07 NOTE — Telephone Encounter (Signed)
 Called and informed him of the information. He stated that they got home, and she has a little pain where in incision is, but none running down her leg. He said it will take time but she is doing great.

## 2024-05-08 MED FILL — Heparin Sodium (Porcine) Inj 1000 Unit/ML: INTRAMUSCULAR | Qty: 60 | Status: AC

## 2024-05-08 MED FILL — Sodium Chloride IV Soln 0.9%: INTRAVENOUS | Qty: 3000 | Status: AC

## 2024-05-13 ENCOUNTER — Ambulatory Visit (INDEPENDENT_AMBULATORY_CARE_PROVIDER_SITE_OTHER): Payer: Medicare PPO

## 2024-05-13 VITALS — Ht 62.0 in | Wt 123.0 lb

## 2024-05-13 DIAGNOSIS — Z0000 Encounter for general adult medical examination without abnormal findings: Secondary | ICD-10-CM | POA: Diagnosis not present

## 2024-05-13 DIAGNOSIS — Z1159 Encounter for screening for other viral diseases: Secondary | ICD-10-CM | POA: Diagnosis not present

## 2024-05-13 NOTE — Patient Instructions (Signed)
 Joan Ritter , Thank you for taking time out of your busy schedule to complete your Annual Wellness Visit with me. I enjoyed our conversation and look forward to speaking with you again next year. I, as well as your care team,  appreciate your ongoing commitment to your health goals. Please review the following plan we discussed and let me know if I can assist you in the future. Your Game plan/ To Do List    Referrals: If you haven't heard from the office you've been referred to, please reach out to them at the phone provided.  Hepatitis C test has been ordered and can be done when you have your next lab work. Remember to update your Tetanus (Tdap) vaccine at your pharmacy. Make sure you update your flu and covid vaccines annually Follow up Visits: Next Medicare AWV with our clinical staff: 05/18/25 @ 10:10   Have you seen your provider in the last 6 months (3 months if uncontrolled diabetes)? Yes Next Office Visit with your provider: 09/12/24  Clinician Recommendations:  Aim for 30 minutes of exercise or brisk walking, 6-8 glasses of water, and 5 servings of fruits and vegetables each day.       This is a list of the screening recommended for you and due dates:  Health Maintenance  Topic Date Due   Hepatitis C Screening  Never done   DTaP/Tdap/Td vaccine (1 - Tdap) Never done   COVID-19 Vaccine (7 - 2024-25 season) 02/18/2024   Flu Shot  05/23/2024   Mammogram  06/17/2024   Medicare Annual Wellness Visit  05/13/2025   Pneumococcal Vaccine for age over 62  Completed   DEXA scan (bone density measurement)  Completed   Zoster (Shingles) Vaccine  Completed   Hepatitis B Vaccine  Aged Out   HPV Vaccine  Aged Out   Meningitis B Vaccine  Aged Out   Colon Cancer Screening  Discontinued    Advanced directives: (ACP Link)Information on Advanced Care Planning can be found at [place]  Secretary of [facility] Advance Health Care Directives Advance Health Care Directives. [email or web address]  Advance Care  Planning is important because it:  [x]  Makes sure you receive the medical care that is consistent with your values, goals, and preferences  [x]  It provides guidance to your family and loved ones and reduces their decisional burden about whether or not they are making the right decisions based on your wishes.  Follow the link provided in your after visit summary or read over the paperwork we have mailed to you to help you started getting your Advance Directives in place. If you need assistance in completing these, please reach out to us  so that we can help you!

## 2024-05-13 NOTE — Progress Notes (Signed)
 Subjective:   Joan Ritter is a 78 y.o. who presents for a Medicare Wellness preventive visit.  As a reminder, Annual Wellness Visits don't include a physical exam, and some assessments may be limited, especially if this visit is performed virtually. We may recommend an in-person follow-up visit with your provider if needed.  Visit Complete: Virtual I connected with  Joan Ritter on 05/13/24 by a audio enabled telemedicine application and verified that I am speaking with the correct person using two identifiers.  Patient Location: Home  Provider Location: Home Office  I discussed the limitations of evaluation and management by telemedicine. The patient expressed understanding and agreed to proceed.  Vital Signs: Because this visit was a virtual/telehealth visit, some criteria may be missing or patient reported. Any vitals not documented were not able to be obtained and vitals that have been documented are patient reported.  VideoDeclined- This patient declined Librarian, academic. Therefore the visit was completed with audio only.  Persons Participating in Visit: Patient.  AWV Questionnaire: Yes: Patient Medicare AWV questionnaire was completed by the patient on 05/09/24; I have confirmed that all information answered by patient is correct and no changes since this date.  Cardiac Risk Factors include: advanced age (>59men, >50 women);dyslipidemia     Objective:    Today's Vitals   05/13/24 1012 05/13/24 1013  Weight: 123 lb (55.8 kg)   Height: 5' 2 (1.575 m)   PainSc:  2    Body mass index is 22.5 kg/m.     05/13/2024   10:26 AM 05/05/2024    6:37 AM 04/17/2024    1:27 PM 08/27/2023   10:40 AM 05/07/2023    2:30 PM 04/08/2023    4:44 AM 05/01/2022    2:36 PM  Advanced Directives  Does Patient Have a Medical Advance Directive? No No No No No No No  Would patient like information on creating a medical advance directive? No - Patient declined No  - Patient declined No - Patient declined No - Patient declined   No - Patient declined    Current Medications (verified) Outpatient Encounter Medications as of 05/13/2024  Medication Sig   acetaminophen  (TYLENOL ) 500 MG tablet Take 500 mg by mouth every 8 (eight) hours as needed.   ascorbic acid  (VITAMIN C ) 500 MG tablet Take 500 mg by mouth daily.   aspirin  81 MG tablet Take 81 mg by mouth daily.   Calcium  Carbonate-Vit D-Min 600-400 MG-UNIT TABS Take 1 tablet by mouth daily.   cetirizine (ZYRTEC) 10 MG tablet Take 10 mg by mouth daily as needed for allergies.   chlorhexidine  (HIBICLENS ) 4 % external liquid Apply 15 mLs (1 Application total) topically as directed for 30 doses. Use as directed daily for 5 days every other week for 6 weeks.   Cholecalciferol  (D3-1000 PO) Take 1 tablet by mouth daily.   clopidogrel  (PLAVIX ) 75 MG tablet Take 1 tablet (75 mg total) by mouth daily.   cyanocobalamin  (VITAMIN B12) 1000 MCG tablet Take 1,000 mcg by mouth daily.   estradiol  (ESTRACE ) 0.1 MG/GM vaginal cream Place 1 Applicatorful vaginally 3 (three) times a week.   famotidine  (PEPCID ) 20 MG tablet Take 1 tablet (20 mg total) by mouth daily.   fluticasone  (FLONASE ) 50 MCG/ACT nasal spray USE 2 SPRAYS EACH NOSTRIL EVERY DAY   gentamicin ointment (GARAMYCIN) 0.1 % Apply 1 Application topically as needed.   methocarbamol  (ROBAXIN ) 500 MG tablet Take 1 tablet (500 mg total) by mouth every  6 (six) hours as needed for muscle spasms.   Multiple Vitamin (MULTIVITAMIN) tablet Take 1 tablet by mouth in the morning and at bedtime.   mupirocin  ointment (BACTROBAN ) 2 % Place 1 Application into the nose 2 (two) times daily for 60 doses. Use as directed 2 times daily for 5 days every other week for 6 weeks.   pantoprazole  (PROTONIX ) 40 MG tablet Take 1 tablet (40 mg total) by mouth daily.   rosuvastatin  (CRESTOR ) 20 MG tablet Take 1 tablet (20 mg total) by mouth daily.   HYDROcodone -acetaminophen  (NORCO) 10-325 MG  tablet Take 1 tablet by mouth every 4 (four) hours as needed for moderate pain (pain score 4-6) ((score 4 to 6)). (Patient not taking: Reported on 05/13/2024)   No facility-administered encounter medications on file as of 05/13/2024.    Allergies (verified) Gabapentin   History: Past Medical History:  Diagnosis Date   Allergic rhinitis    seasonal   Arthritis    Cataract 2014   Routine   Elevated TSH    Esophagitis    GERD (gastroesophageal reflux disease)    History of Helicobacter pylori infection 10/23/2010   Hypercholesterolemia    Osteopenia    Stroke (HCC) 2001   Mini stroke   TIA (transient ischemic attack) 10/24/1999   Past Surgical History:  Procedure Laterality Date   BACK SURGERY  05/05/2024   BREAST BIOPSY Right 2000   neg   BREAST BIOPSY Right 05/18/2017   adipose tissue, coil marker   BREAST EXCISIONAL BIOPSY Right 1970   NEG   CATARACT EXTRACTION Bilateral    COLONOSCOPY  2005   normal    ESOPHAGOGASTRODUODENOSCOPY  2012   normal    HEMORRHOID SURGERY  1980s   INGUINAL HERNIA REPAIR Left    Family History  Problem Relation Age of Onset   Lymphoma Father    Cancer Father    CVA Mother        h/o cerebral hemorrhage   Stroke Mother    Brain cancer Brother    Cardiomyopathy Sister        enlarged heart, died age 74 - sudden death   Breast cancer Maternal Aunt        x2   Cancer Paternal Aunt    Cancer Brother    Heart disease Sister    Cancer Brother    Cancer Paternal Aunt    Colon cancer Neg Hx    Social History   Socioeconomic History   Marital status: Married    Spouse name: Not on file   Number of children: 2   Years of education: Not on file   Highest education level: Bachelor's degree (e.g., BA, AB, BS)  Occupational History   Not on file  Tobacco Use   Smoking status: Never   Smokeless tobacco: Never  Vaping Use   Vaping status: Never Used  Substance and Sexual Activity   Alcohol use: No    Alcohol/week: 0.0 standard  drinks of alcohol   Drug use: No   Sexual activity: Yes    Birth control/protection: Post-menopausal, Abstinence  Other Topics Concern   Not on file  Social History Narrative   Married    Social Drivers of Health   Financial Resource Strain: Low Risk  (05/09/2024)   Overall Financial Resource Strain (CARDIA)    Difficulty of Paying Living Expenses: Not hard at all  Food Insecurity: No Food Insecurity (05/09/2024)   Hunger Vital Sign    Worried About Running Out of  Food in the Last Year: Never true    Ran Out of Food in the Last Year: Never true  Transportation Needs: No Transportation Needs (05/09/2024)   PRAPARE - Administrator, Civil Service (Medical): No    Lack of Transportation (Non-Medical): No  Physical Activity: Inactive (05/09/2024)   Exercise Vital Sign    Days of Exercise per Week: 0 days    Minutes of Exercise per Session: 0 min  Stress: No Stress Concern Present (05/09/2024)   [employer] of Occupational Health - Occupational Stress Questionnaire    Feeling of Stress: Not at all  Social Connections: Socially Integrated (05/09/2024)   Social Connection and Isolation Panel    Frequency of Communication with Friends and Family: More than three times a week    Frequency of Social Gatherings with Friends and Family: Twice a week    Attends Religious Services: More than 4 times per year    Active Member of [employer] or Organizations: Yes    Attends Engineer, structural: More than 4 times per year    Marital Status: Married    Tobacco Counseling Counseling given: Not Answered    Clinical Intake:  Pre-visit preparation completed: Yes  Pain : 0-10 (recovering from back surgery) Pain Score: 2  Pain Type: Chronic pain Pain Location: Back Pain Descriptors / Indicators: Dull Pain Onset: More than a month ago Pain Frequency: Intermittent     BMI - recorded: 22.5 Nutritional Status: BMI of 19-24  Normal Nutritional Risks: None Diabetes:  No  No results found for: HGBA1C   How often do you need to have someone help you when you read instructions, pamphlets, or other written materials from your doctor or pharmacy?: 1 - Never  Interpreter Needed?: No  Information entered by :: Joan Ritter   Activities of Daily Living     05/13/2024   10:16 AM 05/05/2024    6:33 AM  In your present state of health, do you have any difficulty performing the following activities:  Hearing? 1 1  Comment  Bilateral Hearing Aids  Vision? 0 0  Difficulty concentrating or making decisions? 0 0  Walking or climbing stairs? 0   Dressing or bathing? 0   Doing errands, shopping? 0 0  Preparing Food and eating ? N   Using the Toilet? N   In the past six months, have you accidently leaked urine? N   Do you have problems with loss of bowel control? N   Managing your Medications? N   Managing your Finances? N   Housekeeping or managing your Housekeeping? N     Patient Care Team: Glendia Shad, MD as PCP - General (Internal Medicine) Louis Shove, MD as Consulting Physician (Neurosurgery)  I have updated your Care Teams any recent Medical Services you may have received from other providers in the past year.     Assessment:   This is a routine wellness examination for Joan Ritter.  Hearing/Vision screen Hearing Screening - Comments:: Wears aids Vision Screening - Comments:: glasses   Goals Addressed             This Visit's Progress    Patient Stated       Wants to stay active       Depression Screen     05/13/2024   10:21 AM 04/24/2024    2:14 PM 05/07/2023    2:26 PM 02/27/2023   11:27 AM 08/14/2022    1:02 PM 05/01/2022    2:42 PM  02/01/2022    8:10 AM  PHQ 2/9 Scores  PHQ - 2 Score 0 0 0 0 0 0 0  PHQ- 9 Score 0 0 0        Fall Risk     05/13/2024   10:17 AM 04/24/2024    2:14 PM 05/07/2023    2:28 PM 02/27/2023   11:27 AM 08/14/2022    1:02 PM  Fall Risk   Falls in the past year? 0 0 0 0 0  Number falls in past yr: 0 0  0 0 0  Injury with Fall? 0  0 0 0  Risk for fall due to : No Fall Risks No Fall Risks No Fall Risks No Fall Risks No Fall Risks  Follow up Falls evaluation completed;Falls prevention discussed Falls evaluation completed;Education provided Falls prevention discussed;Falls evaluation completed Falls evaluation completed     MEDICARE RISK AT HOME:  Medicare Risk at Home Any stairs in or around the home?: Yes If so, are there any without handrails?: No Home free of loose throw rugs in walkways, pet beds, electrical cords, etc?: Yes Adequate lighting in your home to reduce risk of falls?: Yes Life alert?: No Use of a cane, walker or w/c?: No Grab bars in the bathroom?: No Shower chair or bench in shower?: Yes Elevated toilet seat or a handicapped toilet?: Yes  TIMED UP AND GO:  Was the test performed?  No  Cognitive Function: 6CIT completed    02/22/2017    3:32 PM  MMSE - Mini Mental State Exam  Orientation to time 5   Orientation to Place 5   Registration 3   Attention/ Calculation 5   Recall 3   Language- name 2 objects 2   Language- repeat 1  Language- follow 3 step command 3   Language- read & follow direction 1   Write a sentence 1   Copy design 1   Total score 30      Data saved with a previous flowsheet row definition        05/13/2024   10:26 AM 05/07/2023    2:31 PM 04/07/2020   12:45 PM 03/25/2019   12:19 PM  6CIT Screen  What Year? 0 points 0 points 0 points 0 points  What month? 0 points 0 points 0 points 0 points  What time? 0 points 0 points  0 points  Count back from 20 0 points 0 points  0 points  Months in reverse 0 points 0 points 0 points 0 points  Repeat phrase 0 points 0 points 0 points 0 points  Total Score 0 points 0 points  0 points    Immunizations Immunization History  Administered Date(s) Administered    sv, Bivalent, Protein Subunit Rsvpref,pf [place]) 11/06/2022   Fluad Quad(high Dose 65+) 08/03/2021, 08/14/2022   Influenza Split  08/18/2014   Influenza, High Dose Seasonal PF 06/28/2017, 07/03/2018, 06/12/2019   Influenza-Unspecified 07/23/2013, 09/01/2015, 07/26/2016, 08/20/2023   Moderna Covid-19 Fall Seasonal Vaccine 36yrs & older 08/17/2023   PFIZER(Purple Top)SARS-COV-2 Vaccination 11/12/2019, 12/03/2019, 08/11/2020   Pneumococcal Conjugate-13 05/01/2014   Pneumococcal Polysaccharide-23 08/21/2017   Zoster Recombinant(Shingrix) 02/22/2017, 06/28/2017   Zoster, Live 10/23/2005    Screening Tests Health Maintenance  Topic Date Due   Hepatitis C Screening  Never done   DTaP/Tdap/Td (1 - Tdap) Never done   COVID-19 Vaccine (7 - 2024-25 season) 02/18/2024   Medicare Annual Wellness (AWV)  05/06/2024   INFLUENZA VACCINE  05/23/2024   MAMMOGRAM  06/17/2024   Pneumococcal Vaccine: 50+ Years  Completed   DEXA SCAN  Completed   Zoster Vaccines- Shingrix  Completed   Hepatitis B Vaccines  Aged Out   HPV VACCINES  Aged Out   Meningococcal B Vaccine  Aged Out   Colonoscopy  Discontinued    Health Maintenance  Health Maintenance Due  Topic Date Due   Hepatitis C Screening  Never done   DTaP/Tdap/Td (1 - Tdap) Never done   COVID-19 Vaccine (7 - 2024-25 season) 02/18/2024   Medicare Annual Wellness (AWV)  05/06/2024   Health Maintenance Items Addressed: Labs Ordered: Hepatitis C, discussed the need to update tetanus.    Additional Screening:  Vision Screening: Recommended annual ophthalmology exams for early detection of glaucoma and other disorders of the eye. Up to date [facility] Would you like a referral to an eye doctor? No    Dental Screening: Recommended annual dental exams for proper oral hygiene  Community Resource Referral / Chronic Care Management: CRR required this visit?  No   CCM required this visit?  No   Plan:    I have personally reviewed and noted the following in the patient's chart:   Medical and social history Use of alcohol, tobacco or illicit drugs  Current medications  and supplements including opioid prescriptions. Patient is not currently taking opioid prescriptions. Functional ability and status Nutritional status Physical activity Advanced directives List of other physicians Hospitalizations, surgeries, and ER visits in previous 12 months Vitals Screenings to include cognitive, depression, and falls Referrals and appointments  In addition, I have reviewed and discussed with patient certain preventive protocols, quality metrics, and best practice recommendations. A written personalized care plan for preventive services as well as general preventive health recommendations were provided to patient.   Angeline Fredericks, Ritter   [id number]   After Visit Summary: (MyChart) Due to this being a telephonic visit, the after visit summary with patients personalized plan was offered to patient via MyChart   Notes: Nothing significant to report at this time.

## 2024-06-12 DIAGNOSIS — M4310 Spondylolisthesis, site unspecified: Secondary | ICD-10-CM | POA: Diagnosis not present

## 2024-06-30 ENCOUNTER — Other Ambulatory Visit: Payer: Self-pay | Admitting: Internal Medicine

## 2024-07-10 DIAGNOSIS — Z872 Personal history of diseases of the skin and subcutaneous tissue: Secondary | ICD-10-CM | POA: Diagnosis not present

## 2024-07-10 DIAGNOSIS — Z85828 Personal history of other malignant neoplasm of skin: Secondary | ICD-10-CM | POA: Diagnosis not present

## 2024-07-10 DIAGNOSIS — L578 Other skin changes due to chronic exposure to nonionizing radiation: Secondary | ICD-10-CM | POA: Diagnosis not present

## 2024-07-10 DIAGNOSIS — Z86018 Personal history of other benign neoplasm: Secondary | ICD-10-CM | POA: Diagnosis not present

## 2024-07-24 DIAGNOSIS — M4310 Spondylolisthesis, site unspecified: Secondary | ICD-10-CM | POA: Diagnosis not present

## 2024-07-29 ENCOUNTER — Encounter: Payer: Self-pay | Admitting: Gastroenterology

## 2024-08-11 DIAGNOSIS — E785 Hyperlipidemia, unspecified: Secondary | ICD-10-CM | POA: Diagnosis not present

## 2024-08-11 DIAGNOSIS — Z8249 Family history of ischemic heart disease and other diseases of the circulatory system: Secondary | ICD-10-CM | POA: Diagnosis not present

## 2024-08-11 DIAGNOSIS — K219 Gastro-esophageal reflux disease without esophagitis: Secondary | ICD-10-CM | POA: Diagnosis not present

## 2024-08-11 DIAGNOSIS — D6949 Other primary thrombocytopenia: Secondary | ICD-10-CM | POA: Diagnosis not present

## 2024-08-11 DIAGNOSIS — R030 Elevated blood-pressure reading, without diagnosis of hypertension: Secondary | ICD-10-CM | POA: Diagnosis not present

## 2024-08-11 DIAGNOSIS — M4800 Spinal stenosis, site unspecified: Secondary | ICD-10-CM | POA: Diagnosis not present

## 2024-08-11 DIAGNOSIS — N182 Chronic kidney disease, stage 2 (mild): Secondary | ICD-10-CM | POA: Diagnosis not present

## 2024-08-11 DIAGNOSIS — I700 Atherosclerosis of aorta: Secondary | ICD-10-CM | POA: Diagnosis not present

## 2024-08-11 DIAGNOSIS — M1990 Unspecified osteoarthritis, unspecified site: Secondary | ICD-10-CM | POA: Diagnosis not present

## 2024-08-13 DIAGNOSIS — H26493 Other secondary cataract, bilateral: Secondary | ICD-10-CM | POA: Diagnosis not present

## 2024-08-13 DIAGNOSIS — Z961 Presence of intraocular lens: Secondary | ICD-10-CM | POA: Diagnosis not present

## 2024-08-14 ENCOUNTER — Ambulatory Visit
Admission: RE | Admit: 2024-08-14 | Discharge: 2024-08-14 | Disposition: A | Discharge status: Home / Self Care | Source: Ambulatory Visit | Attending: Internal Medicine | Admitting: Internal Medicine

## 2024-08-14 DIAGNOSIS — Z1231 Encounter for screening mammogram for malignant neoplasm of breast: Secondary | ICD-10-CM | POA: Insufficient documentation

## 2024-09-08 ENCOUNTER — Other Ambulatory Visit: Payer: Self-pay | Admitting: Internal Medicine

## 2024-09-10 ENCOUNTER — Other Ambulatory Visit (INDEPENDENT_AMBULATORY_CARE_PROVIDER_SITE_OTHER)

## 2024-09-10 DIAGNOSIS — N1831 Chronic kidney disease, stage 3a: Secondary | ICD-10-CM

## 2024-09-10 DIAGNOSIS — E7800 Pure hypercholesterolemia, unspecified: Secondary | ICD-10-CM

## 2024-09-10 LAB — CBC WITH DIFFERENTIAL/PLATELET
Basophils Absolute: 0 K/uL (ref 0.0–0.1)
Basophils Relative: 0.7 % (ref 0.0–3.0)
Eosinophils Absolute: 0.1 K/uL (ref 0.0–0.7)
Eosinophils Relative: 2.4 % (ref 0.0–5.0)
HCT: 40.8 % (ref 36.0–46.0)
Hemoglobin: 13.6 g/dL (ref 12.0–15.0)
Lymphocytes Relative: 36.2 % (ref 12.0–46.0)
Lymphs Abs: 1.8 K/uL (ref 0.7–4.0)
MCHC: 33.4 g/dL (ref 30.0–36.0)
MCV: 92.3 fl (ref 78.0–100.0)
Monocytes Absolute: 0.5 K/uL (ref 0.1–1.0)
Monocytes Relative: 11 % (ref 3.0–12.0)
Neutro Abs: 2.5 K/uL (ref 1.4–7.7)
Neutrophils Relative %: 49.7 % (ref 43.0–77.0)
Platelets: 185 K/uL (ref 150.0–400.0)
RBC: 4.42 Mil/uL (ref 3.87–5.11)
RDW: 13.2 % (ref 11.5–15.5)
WBC: 5 K/uL (ref 4.0–10.5)

## 2024-09-10 LAB — LIPID PANEL
Cholesterol: 122 mg/dL (ref 0–200)
HDL: 57.7 mg/dL (ref >39.00)
LDL Cholesterol: 48 mg/dL (ref 0–99)
NonHDL: 64.5
Total CHOL/HDL Ratio: 2
Triglycerides: 85 mg/dL (ref 0.0–149.0)
VLDL: 17 mg/dL (ref 0.0–40.0)

## 2024-09-10 LAB — HEPATIC FUNCTION PANEL
ALT: 18 U/L (ref 0–35)
AST: 21 U/L (ref 0–37)
Albumin: 3.7 g/dL (ref 3.5–5.2)
Alkaline Phosphatase: 66 U/L (ref 39–117)
Bilirubin, Direct: 0.1 mg/dL (ref 0.0–0.3)
Total Bilirubin: 0.4 mg/dL (ref 0.2–1.2)
Total Protein: 5.7 g/dL — ABNORMAL LOW (ref 6.0–8.3)

## 2024-09-10 LAB — BASIC METABOLIC PANEL WITH GFR
BUN: 13 mg/dL (ref 6–23)
CO2: 31 meq/L (ref 19–32)
Calcium: 9.1 mg/dL (ref 8.4–10.5)
Chloride: 106 meq/L (ref 96–112)
Creatinine, Ser: 0.84 mg/dL (ref 0.40–1.20)
GFR: 66.39 mL/min (ref >60.00)
Glucose, Bld: 87 mg/dL (ref 70–99)
Potassium: 4.1 meq/L (ref 3.5–5.1)
Sodium: 143 meq/L (ref 135–145)

## 2024-09-10 LAB — TSH: TSH: 3.12 u[IU]/mL (ref 0.35–5.50)

## 2024-09-11 ENCOUNTER — Ambulatory Visit: Payer: Self-pay | Admitting: Internal Medicine

## 2024-09-11 ENCOUNTER — Encounter: Payer: Self-pay | Admitting: Pharmacist

## 2024-09-11 NOTE — Progress Notes (Signed)
 Pharmacy Quality Measure Review  This patient is appearing on a report for being at risk of failing the adherence measure for cholesterol (statin) medications this calendar year.   Medication: rosuvastatin  20 mg Last fill date: 04/02/24 for 90 day supply  Last Rx written for 90 days 0 refills  Refill order already pended on 11/16.  Pending signature.

## 2024-09-12 ENCOUNTER — Ambulatory Visit: Admitting: Internal Medicine

## 2024-09-12 ENCOUNTER — Encounter: Payer: Self-pay | Admitting: Internal Medicine

## 2024-09-12 VITALS — BP 100/60 | HR 58 | Temp 97.6°F | Ht 62.0 in | Wt 124.8 lb

## 2024-09-12 DIAGNOSIS — E7800 Pure hypercholesterolemia, unspecified: Secondary | ICD-10-CM

## 2024-09-12 DIAGNOSIS — Z8673 Personal history of transient ischemic attack (TIA), and cerebral infarction without residual deficits: Secondary | ICD-10-CM

## 2024-09-12 DIAGNOSIS — D179 Benign lipomatous neoplasm, unspecified: Secondary | ICD-10-CM

## 2024-09-12 DIAGNOSIS — K219 Gastro-esophageal reflux disease without esophagitis: Secondary | ICD-10-CM | POA: Diagnosis not present

## 2024-09-12 DIAGNOSIS — R0981 Nasal congestion: Secondary | ICD-10-CM

## 2024-09-12 DIAGNOSIS — M5441 Lumbago with sciatica, right side: Secondary | ICD-10-CM | POA: Diagnosis not present

## 2024-09-12 MED ORDER — CLOPIDOGREL BISULFATE 75 MG PO TABS: 75.0000 mg | ORAL_TABLET | Freq: Every day | ORAL | 1 refills | Status: AC

## 2024-09-12 MED ORDER — ROSUVASTATIN CALCIUM 20 MG PO TABS: 20.0000 mg | ORAL_TABLET | Freq: Every day | ORAL | 1 refills | Status: AC

## 2024-09-12 MED ORDER — FLUTICASONE PROPIONATE 50 MCG/ACT NA SUSP: 2.0000 | Freq: Every day | NASAL | 5 refills | Status: AC

## 2024-09-12 NOTE — Progress Notes (Unsigned)
 Subjective:    Patient ID: Joan Ritter, female    DOB: 01/26/1946, 78 y.o.   MRN: [id number]  Patient here for  Chief Complaint  Patient presents with   Medical Management of Chronic Issues    6 mth f/u & review labs   Nasal Congestion    C/O nasal stuffiness x 3 days. No other symptoms    HPI Here for a scheduled follow up - follow up regarding recent back surgery and hypercholesterolemia. 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 Doing well s/p surgery. Pain improved. No chest pain or sob. Stays active. No abdominal pain or bowel change. She does report some increased nasal congestion - started 2-3 days ago. No fever. No sinus pressure. No sore throat. No chest congestion of cough. Using steroid nasal spray.    Past Medical History:  Diagnosis Date   Allergic rhinitis    seasonal   Arthritis    Cataract 2014   Routine   Elevated TSH    Esophagitis    GERD (gastroesophageal reflux disease)    History of Helicobacter pylori infection 10/23/2010   Hypercholesterolemia    Osteopenia    Stroke (HCC) 2001   Mini stroke   TIA (transient ischemic attack) 10/24/1999   Past Surgical History:  Procedure Laterality Date   BACK SURGERY  05/05/2024   BREAST BIOPSY Right 2000   neg   BREAST BIOPSY Right 05/18/2017   adipose tissue, coil marker   BREAST EXCISIONAL BIOPSY Right 1970   NEG   CATARACT EXTRACTION Bilateral    COLONOSCOPY  2005   normal    ESOPHAGOGASTRODUODENOSCOPY  2012   normal    HEMORRHOID SURGERY  1980s   INGUINAL HERNIA REPAIR Left    Family History  Problem Relation Age of Onset   Lymphoma Father    Cancer Father    CVA Mother        h/o cerebral hemorrhage   Stroke Mother    Brain cancer Brother    Cardiomyopathy Sister        enlarged heart, died age 38 - sudden death   Breast cancer Maternal Aunt        x2   Cancer Paternal Aunt    Cancer Brother    Heart disease Sister    Cancer Brother    Cancer  Paternal Aunt    Colon cancer Neg Hx    Social History   Socioeconomic History   Marital status: Married    Spouse name: Not on file   Number of children: 2   Years of education: Not on file   Highest education level: Bachelor's degree (e.g., BA, AB, BS)  Occupational History   Not on file  Tobacco Use   Smoking status: Never   Smokeless tobacco: Never  Vaping Use   Vaping status: Never Used  Substance and Sexual Activity   Alcohol use: No    Alcohol/week: 0.0 standard drinks of alcohol   Drug use: No   Sexual activity: Yes    Birth control/protection: Post-menopausal, Abstinence  Other Topics Concern   Not on file  Social History Narrative   Married    Social Drivers of Health   Financial Resource Strain: Low Risk  (09/10/2024)   Overall Financial Resource Strain (CARDIA)    Difficulty of Paying Living Expenses: Not hard at all  Food Insecurity: No Food Insecurity (09/10/2024)   Hunger Vital Sign    Worried About Running Out of  Food in the Last Year: Never true    Ran Out of Food in the Last Year: Never true  Transportation Needs: No Transportation Needs (09/10/2024)   PRAPARE - Administrator, Civil Service (Medical): No    Lack of Transportation (Non-Medical): No  Physical Activity: Insufficiently Active (09/10/2024)   Exercise Vital Sign    Days of Exercise per Week: 4 days    Minutes of Exercise per Session: 30 min  Stress: No Stress Concern Present (09/10/2024)   [employer] of Occupational Health - Occupational Stress Questionnaire    Feeling of Stress: Not at all  Social Connections: Socially Integrated (09/10/2024)   Social Connection and Isolation Panel    Frequency of Communication with Friends and Family: More than three times a week    Frequency of Social Gatherings with Friends and Family: More than three times a week    Attends Religious Services: More than 4 times per year    Active Member of [employer] or Organizations: Yes     Attends Engineer, Structural: More than 4 times per year    Marital Status: Married     Review of Systems     Objective:     BP 100/60   Pulse (!) 58   Temp 97.6 F (36.4 C) (Oral)   Ht 5' 2 (1.575 m)   Wt 124 lb 12 oz (56.6 kg)   SpO2 99%   BMI 22.82 kg/m  Wt Readings from Last 3 Encounters:  09/12/24 124 lb 12 oz (56.6 kg)  05/13/24 123 lb (55.8 kg)  05/05/24 123 lb (55.8 kg)    Physical Exam  {Perform Simple Foot Exam  Perform Detailed exam:1} {Insert foot Exam (Optional):30965}   Outpatient Encounter Medications as of 09/12/2024  Medication Sig   acetaminophen  (TYLENOL ) 500 MG tablet Take 500 mg by mouth every 8 (eight) hours as needed.   aspirin  81 MG tablet Take 81 mg by mouth daily.   Calcium  Carbonate-Vit D-Min 600-400 MG-UNIT TABS Take 1 tablet by mouth daily.   Cholecalciferol  (D3-1000 PO) Take 1 tablet by mouth daily.   clopidogrel  (PLAVIX ) 75 MG tablet Take 1 tablet (75 mg total) by mouth daily.   cyanocobalamin  (VITAMIN B12) 1000 MCG tablet Take 1,000 mcg by mouth daily.   estradiol  (ESTRACE ) 0.1 MG/GM vaginal cream Place 1 Applicatorful vaginally 3 (three) times a week.   famotidine  (PEPCID ) 20 MG tablet Take 1 tablet (20 mg total) by mouth daily.   fluticasone  (FLONASE ) 50 MCG/ACT nasal spray USE 2 SPRAYS EACH NOSTRIL EVERY DAY   gentamicin ointment (GARAMYCIN) 0.1 % Apply 1 Application topically as needed.   Multiple Vitamin (MULTIVITAMIN) tablet Take 1 tablet by mouth in the morning and at bedtime.   pantoprazole  (PROTONIX ) 40 MG tablet Take 1 tablet (40 mg total) by mouth daily.   rosuvastatin  (CRESTOR ) 20 MG tablet Take 1 tablet (20 mg total) by mouth daily.   ascorbic acid  (VITAMIN C ) 500 MG tablet Take 500 mg by mouth daily. (Patient not taking: Reported on 09/12/2024)   [DISCONTINUED] cetirizine (ZYRTEC) 10 MG tablet Take 10 mg by mouth daily as needed for allergies.   [DISCONTINUED] chlorhexidine  (HIBICLENS ) 4 % external liquid Apply  15 mLs (1 Application total) topically as directed for 30 doses. Use as directed daily for 5 days every other week for 6 weeks.   [DISCONTINUED] clopidogrel  (PLAVIX ) 75 MG tablet Take 1 tablet (75 mg total) by mouth daily.   [DISCONTINUED] HYDROcodone -acetaminophen  (NORCO) 10-325 MG  tablet Take 1 tablet by mouth every 4 (four) hours as needed for moderate pain (pain score 4-6) ((score 4 to 6)). (Patient not taking: Reported on 05/13/2024)   [DISCONTINUED] methocarbamol  (ROBAXIN ) 500 MG tablet Take 1 tablet (500 mg total) by mouth every 6 (six) hours as needed for muscle spasms.   No facility-administered encounter medications on file as of 09/12/2024.     Lab Results  Component Value Date   WBC 5.0 09/10/2024   HGB 13.6 09/10/2024   HCT 40.8 09/10/2024   PLT 185.0 09/10/2024   GLUCOSE 87 09/10/2024   CHOL 122 09/10/2024   TRIG 85.0 09/10/2024   HDL 57.70 09/10/2024   LDLCALC 48 09/10/2024   ALT 18 09/10/2024   AST 21 09/10/2024   NA 143 09/10/2024   K 4.1 09/10/2024   CL 106 09/10/2024   CREATININE 0.84 09/10/2024   BUN 13 09/10/2024   CO2 31 09/10/2024   TSH 3.12 09/10/2024    MM 3D SCREENING MAMMOGRAM BILATERAL BREAST Result Date: 08/19/2024 CLINICAL DATA:  Screening. EXAM: DIGITAL SCREENING BILATERAL MAMMOGRAM WITH TOMOSYNTHESIS AND CAD TECHNIQUE: Bilateral screening digital craniocaudal and mediolateral oblique mammograms were obtained. Bilateral screening digital breast tomosynthesis was performed. The images were evaluated with computer-aided detection. COMPARISON:  Previous exam(s). ACR Breast Density Category c: The breasts are heterogeneously dense, which may obscure small masses. FINDINGS: There are no findings suspicious for malignancy. IMPRESSION: No mammographic evidence of malignancy. A result letter of this screening mammogram will be mailed directly to the patient. RECOMMENDATION: Screening mammogram in one year. (Code:SM-B-01Y) BI-RADS CATEGORY  1: Negative.  Electronically Signed   By: Alm Parkins M.D.   On: 08/19/2024 12:00       Assessment & Plan:  There are no diagnoses linked to this encounter.   Allena Hamilton, MD

## 2024-09-14 ENCOUNTER — Encounter: Payer: Self-pay | Admitting: Internal Medicine

## 2024-09-14 DIAGNOSIS — R0981 Nasal congestion: Secondary | ICD-10-CM | POA: Insufficient documentation

## 2024-09-14 NOTE — Assessment & Plan Note (Signed)
 Worked up by Dr Serena Dana (oncology) previously. At that time, felt no further w/up warranted.  Recent CT scan -  revealed a chronic left lower pole renal mass versus complicated cyst has increased by less than 1 cm in size since 2008. Recommend follow-up with Urology. Saw urology 10/31/23 - recommended no further imaging.

## 2024-09-14 NOTE — Assessment & Plan Note (Signed)
 No upper symptoms. Continue on protonix .

## 2024-09-14 NOTE — Assessment & Plan Note (Signed)
 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 Doing well s/p surgery. Pain improved.

## 2024-09-14 NOTE — Assessment & Plan Note (Signed)
Continue plavix and crestor.  ?

## 2024-09-14 NOTE — Assessment & Plan Note (Signed)
 Steroid nasal spray as directed. Saline nasal spray. Follow.  Call with update.

## 2024-09-14 NOTE — Assessment & Plan Note (Signed)
 On crestor  20mg  q day.  Continue low cholesterol diet and exercise.  Follow lipid panel and liver function tests. Discussed recent labs. No change in medication today.  Lab Results  Component Value Date   CHOL 122 09/10/2024   HDL 57.70 09/10/2024   LDLCALC 48 09/10/2024   TRIG 85.0 09/10/2024   CHOLHDL 2 09/10/2024

## 2024-09-17 ENCOUNTER — Telehealth: Payer: Self-pay

## 2024-09-17 ENCOUNTER — Ambulatory Visit: Admitting: Gastroenterology

## 2024-09-17 ENCOUNTER — Encounter: Payer: Self-pay | Admitting: Gastroenterology

## 2024-09-17 VITALS — BP 110/70 | HR 58 | Ht 62.0 in | Wt 124.4 lb

## 2024-09-17 DIAGNOSIS — K219 Gastro-esophageal reflux disease without esophagitis: Secondary | ICD-10-CM | POA: Diagnosis not present

## 2024-09-17 DIAGNOSIS — Z1211 Encounter for screening for malignant neoplasm of colon: Secondary | ICD-10-CM | POA: Diagnosis not present

## 2024-09-17 DIAGNOSIS — Z8673 Personal history of transient ischemic attack (TIA), and cerebral infarction without residual deficits: Secondary | ICD-10-CM | POA: Diagnosis not present

## 2024-09-17 MED ORDER — NA SULFATE-K SULFATE-MG SULF 17.5-3.13-1.6 GM/177ML PO SOLN: 1.0000 | Freq: Once | ORAL | 0 refills | Status: AC

## 2024-09-17 NOTE — Telephone Encounter (Signed)
  Joan Ritter 1946-05-12 [id number]  09/17/24    Dear Allena Hamilton, MD:  We have scheduled the above named patient for a(n) endoscopic procedure. Our records show that (s)he is on anticoagulation therapy.  Please advise as to whether the patient may come off their therapy of Plavix  5 days prior to their procedure which is scheduled for 11/11/24.  Please route your response to Karna Louder, CMA or fax response to [phone].  Sincerely,    [facility]

## 2024-09-17 NOTE — Patient Instructions (Addendum)
 You have been scheduled for a colonoscopy. Please follow written instructions given to you at your visit today.   If you use inhalers (even only as needed), please bring them with you on the day of your procedure.  DO NOT TAKE 7 DAYS PRIOR TO TEST- Trulicity (dulaglutide) Ozempic, Wegovy (semaglutide) Mounjaro, Zepbound (tirzepatide) Bydureon Bcise (exanatide extended release)  DO NOT TAKE 1 DAY PRIOR TO YOUR TEST Rybelsus (semaglutide) Adlyxin (lixisenatide) Victoza (liraglutide) Byetta (exanatide) ___________________________________________________________________________  Due to recent changes in healthcare laws, you may see the results of your imaging and laboratory studies on MyChart before your provider has had a chance to review them.  We understand that in some cases there may be results that are confusing or concerning to you. Not all laboratory results come back in the same time frame and the provider may be waiting for multiple results in order to interpret others.  Please give us  48 hours in order for your provider to thoroughly review all the results before contacting the office for clarification of your results.   _______________________________________________________  If your blood pressure at your visit was 140/90 or greater, please contact your primary care physician to follow up on this.  _______________________________________________________  If you are age 32 or older, your body mass index should be between 23-30. Your Body mass index is 22.75 kg/m. If this is out of the aforementioned range listed, please consider follow up with your Primary Care Provider.  If you are age 68 or younger, your body mass index should be between 19-25. Your Body mass index is 22.75 kg/m. If this is out of the aformentioned range listed, please consider follow up with your Primary Care Provider.   ________________________________________________________  The [facility] providers  would like to encourage you to use MYCHART to communicate with providers for non-urgent requests or questions.  Due to long hold times on the telephone, sending your provider a message by [facility] may be a faster and more efficient way to get a response.  Please allow 48 business hours for a response.  Please remember that this is for non-urgent requests.  _______________________________________________________  [facility] is using a team-based approach to care.  Your team is made up of your doctor and two to three APPS. Our APPS (Nurse Practitioners and Physician Assistants) work with your physician to ensure care continuity for you. They are fully qualified to address your health concerns and develop a treatment plan. They communicate directly with your gastroenterologist to care for you. Seeing the Advanced Practice Practitioners on your physician's team can help you by facilitating care more promptly, often allowing for earlier appointments, access to diagnostic testing, procedures, and other specialty referrals.   Thank you for trusting me with your gastrointestinal care. Deanna May, FNP-C

## 2024-09-17 NOTE — Progress Notes (Signed)
 Chief Complaint:- Colon cancer screening  Primary GI Doctor:Dr. Pyrtle  HPI:  Patient is a  79  year old female patient with past medical history of TIA 2001 (Plavix ), high cholesterol, GERD with esophagitis, and Hx of H pylori, who was referred to me by Joan Shad, MD on 03/12/24 for a evaluation of Colon cancer screening  .    Interval History  Patient presents for evaluation of recall colonoscopy.  She denies altered BMs, abdominal pain, or rectal bleeding.   Patient has history of GERD and controlled on Pantoprazole  40mg  po daily Patient denies dysphagia. Patient denies nausea, vomiting, or weight loss  Nonsmoker. No alcohol use.  Patient on Plavix  and baby ASA 81mg  po daily for history of TIA (2001).  Surgical history: lumbar fusion  Patient's family history includes: father with lymphoma, and brother with brian tumor, aunt with breast CA  Wt Readings from Last 3 Encounters:  09/17/24 124 lb 6 oz (56.4 kg)  09/12/24 124 lb 12 oz (56.6 kg)  05/13/24 123 lb (55.8 kg)    Past Medical History:  Diagnosis Date   Allergic rhinitis    seasonal   Arthritis    Cataract 2014   Routine   Elevated TSH    Esophagitis    GERD (gastroesophageal reflux disease)    History of Helicobacter pylori infection 10/23/2010   Hypercholesterolemia    Osteopenia    Stroke (HCC) 2001   Mini stroke   TIA (transient ischemic attack) 10/24/1999    Past Surgical History:  Procedure Laterality Date   BREAST BIOPSY Right 2000   neg   BREAST BIOPSY Right 05/18/2017   adipose tissue, coil marker   BREAST EXCISIONAL BIOPSY Right 1970   NEG   CATARACT EXTRACTION Bilateral    COLONOSCOPY  2005   normal    ESOPHAGOGASTRODUODENOSCOPY  2012   normal    HEMORRHOID SURGERY  1980s   INGUINAL HERNIA REPAIR Left    LUMBAR FUSION  05/05/2024    Current Outpatient Medications  Medication Sig Dispense Refill   acetaminophen  (TYLENOL ) 500 MG tablet Take 500 mg by mouth every 8 (eight)  hours as needed.     aspirin  81 MG tablet Take 81 mg by mouth daily.     Calcium  Carbonate-Vit D-Min 600-400 MG-UNIT TABS Take 1 tablet by mouth daily.     Cholecalciferol  (D3-1000 PO) Take 1 tablet by mouth daily.     clopidogrel  (PLAVIX ) 75 MG tablet Take 1 tablet (75 mg total) by mouth daily. 90 tablet 1   clopidogrel  (PLAVIX ) 75 MG tablet Take 1 tablet (75 mg total) by mouth daily. 90 tablet 0   cyanocobalamin  (VITAMIN B12) 1000 MCG tablet Take 1,000 mcg by mouth daily.     estradiol  (ESTRACE ) 0.1 MG/GM vaginal cream Place 1 Applicatorful vaginally 3 (three) times a week.     famotidine  (PEPCID ) 20 MG tablet Take 1 tablet (20 mg total) by mouth daily. 90 tablet 3   fluticasone  (FLONASE ) 50 MCG/ACT nasal spray Place 2 sprays into both nostrils daily. 16 g 5   gentamicin ointment (GARAMYCIN) 0.1 % Apply 1 Application topically as needed.     Multiple Vitamin (MULTIVITAMIN) tablet Take 1 tablet by mouth in the morning and at bedtime.     pantoprazole  (PROTONIX ) 40 MG tablet Take 1 tablet (40 mg total) by mouth daily. 90 tablet 3   rosuvastatin  (CRESTOR ) 20 MG tablet Take 1 tablet (20 mg total) by mouth daily. 90 tablet 1   No  current facility-administered medications for this visit.    Allergies as of 09/17/2024 - Review Complete 09/17/2024  Allergen Reaction Noted   Gabapentin Other (See Comments) 05/22/2023    Family History  Problem Relation Age of Onset   CVA Mother        h/o cerebral hemorrhage   Stroke Mother    Lymphoma Father    Cancer Father    Heart disease Sister    Brain cancer Brother    Heart disease Paternal Grandfather    Breast cancer Maternal Aunt        x2   Cancer Maternal Aunt    Healthy Son    Healthy Son    Colon cancer Neg Hx     Review of Systems:    Constitutional: No weight loss, fever, chills, weakness or fatigue HEENT: Eyes: No change in vision               Ears, Nose, Throat:  No change in hearing or congestion Skin: No rash or  itching Cardiovascular: No chest pain, chest pressure or palpitations   Respiratory: No SOB or cough Gastrointestinal: See HPI and otherwise negative Genitourinary: No dysuria or change in urinary frequency Neurological: No headache, dizziness or syncope Musculoskeletal: No new muscle or joint pain Hematologic: No bleeding or bruising Psychiatric: No history of depression or anxiety    Physical Exam:  Vital signs: BP 110/70 (BP Location: Left Arm, Patient Position: Sitting, Cuff Size: Normal)   Pulse (!) 58   Ht 5' 2 (1.575 m) Comment: height measured without shoes  Wt 124 lb 6 oz (56.4 kg)   BMI 22.75 kg/m   Constitutional:   Pleasant  female appears to be in NAD, Well developed, Well nourished, alert and cooperative Eyes:   PEERL, EOMI. No icterus. Conjunctiva pink. Neck:  Supple Throat: Oral cavity and pharynx without inflammation, swelling or lesion.  Respiratory: Respirations even and unlabored. Lungs clear to auscultation bilaterally.   No wheezes, crackles, or rhonchi.  Cardiovascular: Normal S1, S2. Regular rate and rhythm. No peripheral edema, cyanosis or pallor.  Gastrointestinal:  Soft, nondistended, nontender. No rebound or guarding. Normal bowel sounds. No appreciable masses or hepatomegaly. Rectal:  Not performed.  Msk:  Symmetrical without gross deformities. Without edema, no deformity or joint abnormality.  Neurologic:  Alert and  oriented x4;  grossly normal neurologically.  Skin:   Dry and intact without significant lesions or rashes.  RELEVANT LABS AND IMAGING: CBC    Latest Ref Rng & Units 09/10/2024    7:24 AM 04/17/2024    2:00 PM 09/07/2023    7:24 AM  CBC  WBC 4.0 - 10.5 K/uL 5.0  6.4  8.9   Hemoglobin 12.0 - 15.0 g/dL 86.3  86.0  86.5   Hematocrit 36.0 - 46.0 % 40.8  41.2  39.7   Platelets 150.0 - 400.0 K/uL 185.0  203  254.0      CMP     Latest Ref Rng & Units 09/10/2024    7:24 AM 04/17/2024    2:00 PM 03/10/2024    7:41 AM  CMP  Glucose  70 - 99 mg/dL 87  88  98   BUN 6 - 23 mg/dL 13  9  10    Creatinine 0.40 - 1.20 mg/dL 9.15  9.12  9.19   Sodium 135 - 145 mEq/L 143  140  144   Potassium 3.5 - 5.1 mEq/L 4.1  4.4  3.9   Chloride 96 - 112 mEq/L  106  104  107   CO2 19 - 32 mEq/L 31  27  29    Calcium  8.4 - 10.5 mg/dL 9.1  9.1  8.9   Total Protein 6.0 - 8.3 g/dL 5.7   6.0   Total Bilirubin 0.2 - 1.2 mg/dL 0.4   0.6   Alkaline Phos 39 - 117 U/L 66   45   AST 0 - 37 U/L 21   21   ALT 0 - 35 U/L 18   20      Lab Results  Component Value Date   TSH 3.12 09/10/2024   07/2014 colonoscopy , recall 10 years with Dr. Albertus  Normal  06/2011 EGD LA Grade A reflux esophagitis Exam otherwise normal Normal stomach Normal examined duodenum Path:   03/26/2004 colonoscopy Colon normal  Assessment: Encounter Diagnoses  Name Primary?   Special screening for malignant neoplasms, colon Yes   Gastroesophageal reflux disease without esophagitis    History of TIA (transient ischemic attack)   Healthy 78 year old who presents for recall colon screening colonoscopy, last colon 07/2014  (normal, and one in 2005 also normal. Will need clearance from PCP to hold Plavix . Pt currently with no lower GI symptoms.  Patient also has history of GERD that is managed with Pantoprazole  40 mg po daily,no changes needed.   History of TIA (2001) -on ASA and Plavix   Plan: -GERD diet -continue pantoprazole  40mg  po daily -Schedule for a colonoscopy in [facility] with Dr. Albertus. The risks and benefits of colonoscopy with possible polypectomy / biopsies were discussed and the patient agrees to proceed.  -Clearance from PCP for Plavix  -Hold Plavix  5 days before procedure - will instruct when and how to resume after procedure. Risks and benefits of procedure including bleeding, perforation, infection, missed lesions, medication reactions and possible hospitalization or surgery if complications occur explained. Additional rare but real risk of cardiovascular  event such as heart attack or ischemia/infarct of other organs off Plavix  explained and need to seek urgent help if this occurs. Will communicate by phone or EMR with patient's prescribing provider that to confirm holding Plavix  is reasonable in this case.     Thank you for the courtesy of this consult. Please call me with any questions or concerns.   Tammara Massing, FNP-C [facility] 09/17/2024, 12:50 PM  Cc: Joan Shad, MD

## 2024-09-21 NOTE — Telephone Encounter (Signed)
 Thank you for reaching out. Just need to clarify a few things.  Does she also have to be off aspirin  or can she remain on aspirin  during the procedure? Also, when is the procesure?

## 2024-09-23 NOTE — Telephone Encounter (Signed)
 Please call and notify her that I would like to schedule her for a pre op evaluation. Surgery scheduled 11/17/24.

## 2024-09-25 NOTE — Telephone Encounter (Signed)
 Appt made for jan9th

## 2024-09-30 ENCOUNTER — Encounter: Payer: Self-pay | Admitting: Internal Medicine

## 2024-10-01 MED ORDER — ESTRADIOL 0.01 % VA CREA: TOPICAL_CREAM | VAGINAL | 3 refills | Status: AC

## 2024-10-01 NOTE — Telephone Encounter (Signed)
 Rx sent in for estrace cream.

## 2024-10-01 NOTE — Telephone Encounter (Signed)
 Pt requesting refill.     estradiol  (ESTRACE ) 0.1 MG/GM vaginal cream -- -- 05/22/2023 --  Sig - Route: Place 1 Applicatorful vaginally 3 (three) times a week. - Vaginal

## 2024-10-31 ENCOUNTER — Encounter: Payer: Self-pay | Admitting: Internal Medicine

## 2024-10-31 ENCOUNTER — Ambulatory Visit: Admitting: Internal Medicine

## 2024-10-31 VITALS — BP 126/76 | HR 60 | Temp 98.1°F | Ht 62.0 in | Wt 126.6 lb

## 2024-10-31 DIAGNOSIS — Z8673 Personal history of transient ischemic attack (TIA), and cerebral infarction without residual deficits: Secondary | ICD-10-CM | POA: Diagnosis not present

## 2024-10-31 DIAGNOSIS — K219 Gastro-esophageal reflux disease without esophagitis: Secondary | ICD-10-CM

## 2024-10-31 DIAGNOSIS — E7800 Pure hypercholesterolemia, unspecified: Secondary | ICD-10-CM

## 2024-10-31 NOTE — Progress Notes (Signed)
 "  Subjective:    Patient ID: Joan Ritter, female    DOB: August 01, 1946, 79 y.o.   MRN: [id number]  Patient here for  Chief Complaint  Patient presents with   Pre-op Exam    HPI Here for a pre op evaluation. She is scheduled for an endoscopic procedure. GI recommendin - off plavix  for 5 days. She is also on aspirin . She does not need to stop aspirin  for the procedure. Discussed with her today. Aware of possible risk stopping. 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 No chest pain or sob reported. No abdominal pain or bowel change reported.    Past Medical History:  Diagnosis Date   Allergic rhinitis    seasonal   Arthritis    Cataract 2014   Routine   Elevated TSH    Esophagitis    GERD (gastroesophageal reflux disease)    History of Helicobacter pylori infection 10/23/2010   Hypercholesterolemia    Osteopenia    Stroke (HCC) 2001   Mini stroke   TIA (transient ischemic attack) 10/24/1999   Past Surgical History:  Procedure Laterality Date   BREAST BIOPSY Right 2000   neg   BREAST BIOPSY Right 05/18/2017   adipose tissue, coil marker   BREAST EXCISIONAL BIOPSY Right 1970   NEG   CATARACT EXTRACTION Bilateral    COLONOSCOPY  2005   normal    ESOPHAGOGASTRODUODENOSCOPY  2012   normal    HEMORRHOID SURGERY  1980s   INGUINAL HERNIA REPAIR Left    LUMBAR FUSION  05/05/2024   Family History  Problem Relation Age of Onset   CVA Mother        h/o cerebral hemorrhage   Stroke Mother    Lymphoma Father    Cancer Father    Heart disease Sister    Brain cancer Brother    Heart disease Paternal Grandfather    Breast cancer Maternal Aunt        x2   Cancer Maternal Aunt    Healthy Son    Healthy Son    Cancer Brother    Cancer Paternal Aunt    Cancer Brother    Colon cancer Neg Hx    Social History   Socioeconomic History   Marital status: Married    Spouse name: Not on file   Number of children: 2   Years of  education: Not on file   Highest education level: Bachelor's degree (e.g., BA, AB, BS)  Occupational History   Occupation: retired runner, broadcasting/film/video  Tobacco Use   Smoking status: Never   Smokeless tobacco: Never  Vaping Use   Vaping status: Never Used  Substance and Sexual Activity   Alcohol use: No    Alcohol/week: 0.0 standard drinks of alcohol   Drug use: No   Sexual activity: Yes    Birth control/protection: Post-menopausal, Abstinence  Other Topics Concern   Not on file  Social History Narrative   Married    Social Drivers of Health   Tobacco Use: Low Risk (11/08/2024)   Patient History    Smoking Tobacco Use: Never    Smokeless Tobacco Use: Never    Passive Exposure: Not on file  Financial Resource Strain: Low Risk (09/10/2024)   Overall Financial Resource Strain (CARDIA)    Difficulty of Paying Living Expenses: Not hard at all  Food Insecurity: No Food Insecurity (09/10/2024)   Epic    Worried About Radiation Protection Practitioner of Food in the Last Year:  Never true    Ran Out of Food in the Last Year: Never true  Transportation Needs: No Transportation Needs (09/10/2024)   Epic    Lack of Transportation (Medical): No    Lack of Transportation (Non-Medical): No  Physical Activity: Insufficiently Active (09/10/2024)   Exercise Vital Sign    Days of Exercise per Week: 4 days    Minutes of Exercise per Session: 30 min  Stress: No Stress Concern Present (09/10/2024)   [employer] of Occupational Health - Occupational Stress Questionnaire    Feeling of Stress: Not at all  Social Connections: Socially Integrated (09/10/2024)   Social Connection and Isolation Panel    Frequency of Communication with Friends and Family: More than three times a week    Frequency of Social Gatherings with Friends and Family: More than three times a week    Attends Religious Services: More than 4 times per year    Active Member of Clubs or Organizations: Yes    Attends Banker Meetings: More than  4 times per year    Marital Status: Married  Depression (PHQ2-9): Low Risk (09/12/2024)   Depression (PHQ2-9)    PHQ-2 Score: 0  Alcohol Screen: Low Risk (09/10/2024)   Alcohol Screen    Last Alcohol Screening Score (AUDIT): 1  Housing: Unknown (09/10/2024)   Epic    Unable to Pay for Housing in the Last Year: No    Number of Times Moved in the Last Year: Not on file    Homeless in the Last Year: No  Utilities: Not At Risk (05/13/2024)   Epic    Threatened with loss of utilities: No  Health Literacy: Adequate Health Literacy (05/13/2024)   B1300 Health Literacy    Frequency of need for help with medical instructions: Never     Review of Systems  Constitutional:  Negative for appetite change and unexpected weight change.  HENT:  Negative for congestion and sinus pressure.   Respiratory:  Negative for cough, chest tightness and shortness of breath.   Cardiovascular:  Negative for chest pain, palpitations and leg swelling.  Gastrointestinal:  Negative for abdominal pain, diarrhea, nausea and vomiting.  Genitourinary:  Negative for difficulty urinating and dysuria.  Musculoskeletal:  Negative for joint swelling and myalgias.  Skin:  Negative for color change and rash.  Neurological:  Negative for dizziness and headaches.  Psychiatric/Behavioral:  Negative for agitation and dysphoric mood.        Objective:     BP 126/76   Pulse 60   Temp 98.1 F (36.7 C) (Oral)   Ht 5' 2 (1.575 m)   Wt 126 lb 9.6 oz (57.4 kg)   SpO2 96%   BMI 23.16 kg/m  Wt Readings from Last 3 Encounters:  10/31/24 126 lb 9.6 oz (57.4 kg)  09/17/24 124 lb 6 oz (56.4 kg)  09/12/24 124 lb 12 oz (56.6 kg)    Physical Exam Vitals reviewed.  Constitutional:      General: She is not in acute distress.    Appearance: Normal appearance.  HENT:     Head: Normocephalic and atraumatic.     Right Ear: External ear normal.     Left Ear: External ear normal.     Mouth/Throat:     Pharynx: No  oropharyngeal exudate or posterior oropharyngeal erythema.  Eyes:     General: No scleral icterus.       Right eye: No discharge.        Left eye: No  discharge.     Conjunctiva/sclera: Conjunctivae normal.  Neck:     Thyroid : No thyromegaly.  Cardiovascular:     Rate and Rhythm: Normal rate and regular rhythm.  Pulmonary:     Effort: No respiratory distress.     Breath sounds: Normal breath sounds. No wheezing.  Abdominal:     General: Bowel sounds are normal.     Palpations: Abdomen is soft.     Tenderness: There is no abdominal tenderness.  Musculoskeletal:        General: No swelling or tenderness.     Cervical back: Neck supple. No tenderness.  Lymphadenopathy:     Cervical: No cervical adenopathy.  Skin:    Findings: No erythema or rash.  Neurological:     Mental Status: She is alert.  Psychiatric:        Mood and Affect: Mood normal.        Behavior: Behavior normal.         Outpatient Encounter Medications as of 10/31/2024  Medication Sig   acetaminophen  (TYLENOL ) 500 MG tablet Take 500 mg by mouth every 8 (eight) hours as needed.   aspirin  81 MG tablet Take 81 mg by mouth daily.   Calcium  Carbonate-Vit D-Min 600-400 MG-UNIT TABS Take 1 tablet by mouth daily.   Cholecalciferol  (D3-1000 PO) Take 1 tablet by mouth daily.   clopidogrel  (PLAVIX ) 75 MG tablet Take 1 tablet (75 mg total) by mouth daily.   cyanocobalamin  (VITAMIN B12) 1000 MCG tablet Take 1,000 mcg by mouth daily.   estradiol  (ESTRACE ) 0.01 % CREA vaginal cream Apply topically 2x/week.   famotidine  (PEPCID ) 20 MG tablet Take 1 tablet (20 mg total) by mouth daily.   fluticasone  (FLONASE ) 50 MCG/ACT nasal spray Place 2 sprays into both nostrils daily.   gentamicin ointment (GARAMYCIN) 0.1 % Apply 1 Application topically as needed.   Multiple Vitamin (MULTIVITAMIN) tablet Take 1 tablet by mouth in the morning and at bedtime.   pantoprazole  (PROTONIX ) 40 MG tablet Take 1 tablet (40 mg total) by mouth daily.    rosuvastatin  (CRESTOR ) 20 MG tablet Take 1 tablet (20 mg total) by mouth daily.   [DISCONTINUED] clopidogrel  (PLAVIX ) 75 MG tablet Take 1 tablet (75 mg total) by mouth daily.   No facility-administered encounter medications on file as of 10/31/2024.     Lab Results  Component Value Date   WBC 5.0 09/10/2024   HGB 13.6 09/10/2024   HCT 40.8 09/10/2024   PLT 185.0 09/10/2024   GLUCOSE 87 09/10/2024   CHOL 122 09/10/2024   TRIG 85.0 09/10/2024   HDL 57.70 09/10/2024   LDLCALC 48 09/10/2024   ALT 18 09/10/2024   AST 21 09/10/2024   NA 143 09/10/2024   K 4.1 09/10/2024   CL 106 09/10/2024   CREATININE 0.84 09/10/2024   BUN 13 09/10/2024   CO2 31 09/10/2024   TSH 3.12 09/10/2024    MM 3D SCREENING MAMMOGRAM BILATERAL BREAST Result Date: 08/19/2024 CLINICAL DATA:  Screening. EXAM: DIGITAL SCREENING BILATERAL MAMMOGRAM WITH TOMOSYNTHESIS AND CAD TECHNIQUE: Bilateral screening digital craniocaudal and mediolateral oblique mammograms were obtained. Bilateral screening digital breast tomosynthesis was performed. The images were evaluated with computer-aided detection. COMPARISON:  Previous exam(s). ACR Breast Density Category c: The breasts are heterogeneously dense, which may obscure small masses. FINDINGS: There are no findings suspicious for malignancy. IMPRESSION: No mammographic evidence of malignancy. A result letter of this screening mammogram will be mailed directly to the patient. RECOMMENDATION: Screening mammogram in one year. (Code:[id number]-B-01Y)  BI-RADS CATEGORY  1: Negative. Electronically Signed   By: Alm Parkins M.D.   On: 08/19/2024 12:00       Assessment & Plan:  Hypercholesterolemia Assessment & Plan: On crestor  20mg  q day.  Continue low cholesterol diet and exercise.  Follow lipid panel.  Lab Results  Component Value Date   CHOL 122 09/10/2024   HDL 57.70 09/10/2024   LDLCALC 48 09/10/2024   TRIG 85.0 09/10/2024   CHOLHDL 2 09/10/2024      History of TIA  (transient ischemic attack) Assessment & Plan: Continues on plavix  and aspirin . She is scheduled for an endoscopic procedure. GI recommendin - off plavix  for 5 days. She is also on aspirin . She does not need to stop aspirin  for the procedure. Discussed with her today. Aware of possible risk stopping. Plans to proceed with procedure. Will continue aspirin .    Gastroesophageal reflux disease without esophagitis Assessment & Plan: Continues on protonix .       Allena Hamilton, MD "

## 2024-11-08 ENCOUNTER — Encounter: Payer: Self-pay | Admitting: Internal Medicine

## 2024-11-08 NOTE — Assessment & Plan Note (Signed)
 On crestor  20mg  q day.  Continue low cholesterol diet and exercise.  Follow lipid panel.  Lab Results  Component Value Date   CHOL 122 09/10/2024   HDL 57.70 09/10/2024   LDLCALC 48 09/10/2024   TRIG 85.0 09/10/2024   CHOLHDL 2 09/10/2024

## 2024-11-08 NOTE — Assessment & Plan Note (Signed)
 Continues on plavix  and aspirin . She is scheduled for an endoscopic procedure. GI recommendin - off plavix  for 5 days. She is also on aspirin . She does not need to stop aspirin  for the procedure. Discussed with her today. Aware of possible risk stopping. Plans to proceed with procedure. Will continue aspirin .

## 2024-11-08 NOTE — Assessment & Plan Note (Signed)
Continues on protonix.  ?

## 2024-11-11 ENCOUNTER — Encounter: Admitting: Internal Medicine

## 2024-11-28 ENCOUNTER — Encounter: Payer: Self-pay | Admitting: Internal Medicine

## 2024-12-05 ENCOUNTER — Encounter: Admitting: Internal Medicine

## 2025-03-23 ENCOUNTER — Other Ambulatory Visit

## 2025-03-26 ENCOUNTER — Encounter: Admitting: Internal Medicine

## 2025-05-18 ENCOUNTER — Ambulatory Visit
